# Patient Record
Sex: Female | Born: 1945
Health system: Southern US, Community
[De-identification: ages and names within clinical notes are randomized; demographics above are authoritative.]

## PROBLEM LIST (undated history)

## (undated) DIAGNOSIS — E079 Disorder of thyroid, unspecified: Secondary | ICD-10-CM

## (undated) DIAGNOSIS — I10 Essential (primary) hypertension: Secondary | ICD-10-CM

## (undated) DIAGNOSIS — M7120 Synovial cyst of popliteal space [Baker], unspecified knee: Principal | ICD-10-CM

## (undated) DIAGNOSIS — C9110 Chronic lymphocytic leukemia of B-cell type not having achieved remission: Secondary | ICD-10-CM

## (undated) DIAGNOSIS — M1990 Unspecified osteoarthritis, unspecified site: Secondary | ICD-10-CM

## (undated) HISTORY — DX: Essential (primary) hypertension: I10

## (undated) HISTORY — PX: TUBAL LIGATION: SHX77

## (undated) HISTORY — PX: VAGINAL HYSTERECTOMY: SUR661

## (undated) HISTORY — PX: THYROID SURGERY: SHX805

## (undated) HISTORY — DX: Disorder of thyroid, unspecified: E07.9

## (undated) HISTORY — DX: Synovial cyst of popliteal space (Baker), unspecified knee: M71.20

## (undated) HISTORY — DX: Unspecified osteoarthritis, unspecified site: M19.90

## (undated) HISTORY — DX: Chronic lymphocytic leukemia of B-cell type not having achieved remission: C91.10

---

## 2005-02-07 ENCOUNTER — Ambulatory Visit (HOSPITAL_COMMUNITY): Admission: RE | Admit: 2005-02-07 | Discharge: 2005-02-07 | Payer: Self-pay | Admitting: Pulmonary Disease

## 2011-08-31 DIAGNOSIS — H43399 Other vitreous opacities, unspecified eye: Secondary | ICD-10-CM | POA: Diagnosis not present

## 2011-08-31 DIAGNOSIS — H2510 Age-related nuclear cataract, unspecified eye: Secondary | ICD-10-CM | POA: Diagnosis not present

## 2011-09-25 ENCOUNTER — Other Ambulatory Visit (HOSPITAL_COMMUNITY): Payer: Self-pay | Admitting: Family Medicine

## 2011-09-25 ENCOUNTER — Ambulatory Visit (HOSPITAL_COMMUNITY)
Admission: RE | Admit: 2011-09-25 | Discharge: 2011-09-25 | Disposition: A | Discharge status: Home / Self Care | Payer: Medicare Other | Source: Ambulatory Visit | Attending: Family Medicine | Admitting: Family Medicine

## 2011-09-25 DIAGNOSIS — R5381 Other malaise: Secondary | ICD-10-CM | POA: Diagnosis not present

## 2011-09-25 DIAGNOSIS — E039 Hypothyroidism, unspecified: Secondary | ICD-10-CM | POA: Diagnosis not present

## 2011-09-25 DIAGNOSIS — R937 Abnormal findings on diagnostic imaging of other parts of musculoskeletal system: Secondary | ICD-10-CM | POA: Diagnosis not present

## 2011-09-25 DIAGNOSIS — M25569 Pain in unspecified knee: Secondary | ICD-10-CM | POA: Diagnosis not present

## 2011-09-25 DIAGNOSIS — M1710 Unilateral primary osteoarthritis, unspecified knee: Secondary | ICD-10-CM | POA: Diagnosis not present

## 2011-09-25 DIAGNOSIS — M7120 Synovial cyst of popliteal space [Baker], unspecified knee: Secondary | ICD-10-CM

## 2011-09-25 DIAGNOSIS — E669 Obesity, unspecified: Secondary | ICD-10-CM | POA: Diagnosis not present

## 2011-09-25 DIAGNOSIS — N390 Urinary tract infection, site not specified: Secondary | ICD-10-CM | POA: Diagnosis not present

## 2011-09-27 DIAGNOSIS — E039 Hypothyroidism, unspecified: Secondary | ICD-10-CM | POA: Diagnosis not present

## 2011-09-27 DIAGNOSIS — M7120 Synovial cyst of popliteal space [Baker], unspecified knee: Secondary | ICD-10-CM | POA: Diagnosis not present

## 2011-09-28 ENCOUNTER — Encounter: Payer: Self-pay | Admitting: Orthopedic Surgery

## 2011-09-28 ENCOUNTER — Ambulatory Visit (INDEPENDENT_AMBULATORY_CARE_PROVIDER_SITE_OTHER): Payer: Medicare Other | Admitting: Orthopedic Surgery

## 2011-09-28 VITALS — BP 150/80 | Ht 69.0 in | Wt 234.0 lb

## 2011-09-28 DIAGNOSIS — R2240 Localized swelling, mass and lump, unspecified lower limb: Secondary | ICD-10-CM

## 2011-09-28 DIAGNOSIS — R29898 Other symptoms and signs involving the musculoskeletal system: Secondary | ICD-10-CM

## 2011-09-28 MED ORDER — GABAPENTIN 100 MG PO CAPS: 100.0000 mg | ORAL_CAPSULE | Freq: Three times a day (TID) | ORAL | Status: DC

## 2011-09-28 NOTE — Patient Instructions (Signed)
   You  will be scheduled for MRI of the RIGHT knee to evaluate the mass. A recertification will be done with her insurance and then he will come back for reevaluation.

## 2011-10-02 ENCOUNTER — Other Ambulatory Visit: Payer: Self-pay | Admitting: *Deleted

## 2011-10-02 ENCOUNTER — Telehealth: Payer: Self-pay | Admitting: Radiology

## 2011-10-02 DIAGNOSIS — R2240 Localized swelling, mass and lump, unspecified lower limb: Secondary | ICD-10-CM

## 2011-10-02 NOTE — Telephone Encounter (Signed)
I called to give the patient her MRI appointment at [facility] on 10-06-11 at 8:15 pm. Patient has Medicare, no precert is needed. She will follow bach in our office for results.

## 2011-10-04 ENCOUNTER — Encounter: Payer: Self-pay | Admitting: Orthopedic Surgery

## 2011-10-04 DIAGNOSIS — R2240 Localized swelling, mass and lump, unspecified lower limb: Secondary | ICD-10-CM | POA: Insufficient documentation

## 2011-10-04 NOTE — Progress Notes (Signed)
  Subjective:    Jacqueline Casey is a 66 y.o. female who presents history of a mass behind her RIGHT leg for about one month.  The mass came on gradually is associated with sharp burning 7/10 intermittently relieved by heat worsened with cold weather associated with any swelling.  She had an ultrasound which was normal except for the Baker's cyst.  We will do a knee x-ray today. The following portions of the patient's history were reviewed and updated as appropriate: allergies, current medications, past family history, past medical history, past social history, past surgical history and problem list.   Review of Systems A comprehensive review of systems was negative except for: Constitutional: positive for weight gain Allergic/Immunologic: positive for various food adverse reactions   Objective:    BP 150/80  Ht 5\' 9"  (1.753 m)  Wt 234 lb (106.142 kg)  BMI 34.56 kg/m2  Physical Exam(12) GENERAL: normal development   CDV: pulses are normal   Skin: normal  Lymph: nodes were not palpable/normal  Psychiatric: awake, alert and oriented  Neuro: normal sensation   Right knee: RIGHT knee has full range of motion.  There is swelling and tenderness behind the knee in the popliteal fossa with associated mass consistent with possible Baker cyst.  Muscle strength and muscle tone normal in the knee is stable with no meniscal signs.  Left knee:  normal and no effusion, full active range of motion, no joint line tenderness, ligamentous structures intact.   X-ray right knee: shows DJD changes, likely chronic    Assessment:    Right baker cyst    Plan:    attempt aspiration.  Aspiration attempt unsuccessful  Recommended MRI to further evaluate location assistant cause of the cyst which is usually an intra-articularreason.  Attempted aspiration RIGHT knee  Verbal consent  Time out  In the prone position the knee was prepped with alcohol and ethyl chloride.  18-gauge needle was  placed into the area in question and no fluid was expressed.  No complications were noted.

## 2011-10-06 ENCOUNTER — Other Ambulatory Visit: Payer: Medicare Other

## 2011-10-06 ENCOUNTER — Ambulatory Visit
Admission: RE | Admit: 2011-10-06 | Discharge: 2011-10-06 | Disposition: A | Discharge status: Home / Self Care | Payer: Medicare Other | Source: Ambulatory Visit | Attending: Orthopedic Surgery | Admitting: Orthopedic Surgery

## 2011-10-06 DIAGNOSIS — S83289A Other tear of lateral meniscus, current injury, unspecified knee, initial encounter: Secondary | ICD-10-CM | POA: Diagnosis not present

## 2011-10-06 DIAGNOSIS — M8569 Other cyst of bone, multiple sites: Secondary | ICD-10-CM | POA: Diagnosis not present

## 2011-10-06 DIAGNOSIS — M799 Soft tissue disorder, unspecified: Secondary | ICD-10-CM | POA: Diagnosis not present

## 2011-10-06 DIAGNOSIS — R2240 Localized swelling, mass and lump, unspecified lower limb: Secondary | ICD-10-CM

## 2011-10-06 MED ORDER — GADOBENATE DIMEGLUMINE 529 MG/ML IV SOLN
20.0000 mL | Freq: Once | INTRAVENOUS | Status: AC | PRN
  Administered 2011-10-06: 20 mL via INTRAVENOUS

## 2011-10-12 ENCOUNTER — Ambulatory Visit: Payer: Medicare Other | Admitting: Orthopedic Surgery

## 2011-10-18 ENCOUNTER — Ambulatory Visit (INDEPENDENT_AMBULATORY_CARE_PROVIDER_SITE_OTHER): Payer: Medicare Other | Admitting: Orthopedic Surgery

## 2011-10-18 ENCOUNTER — Encounter: Payer: Self-pay | Admitting: Orthopedic Surgery

## 2011-10-18 VITALS — BP 142/70 | Ht 69.0 in | Wt 234.0 lb

## 2011-10-18 DIAGNOSIS — M7120 Synovial cyst of popliteal space [Baker], unspecified knee: Secondary | ICD-10-CM

## 2011-10-18 HISTORY — DX: Synovial cyst of popliteal space (Baker), unspecified knee: M71.20

## 2011-10-18 NOTE — Progress Notes (Signed)
Patient ID: Jacqueline Casey, female   DOB: 15-Mar-1946, 66 y.o.   MRN: [id number] Chief Complaint  Patient presents with  . Results    mri result review   Previous notes indicate patient had any Baker cyst with attempted aspiration unsuccessful sent for an MRI to rule out mass MRI came back Baker cyst with severe arthritis of the knee and a lateral meniscal tear.  The patient is having pain in her lateral calf and the medial hamstring  I suspect that she may have some mononeuritis as Neurontin has seemed to help her.  Her knee seems fine normal range of motion stability and strength but no meniscal signs and no specific tenderness at the joint line.  Distal pulses intact.  Normal light touch and pressure sensation noted.  Ambulation normal.  Recommend ultrasound for calf pain with iontophoresis continue Neurontin recheck in 4 weeks.

## 2011-10-18 NOTE — Patient Instructions (Signed)
Therapy will be ordered @ [facility]

## 2011-10-23 ENCOUNTER — Ambulatory Visit (HOSPITAL_COMMUNITY)
Admission: RE | Admit: 2011-10-23 | Discharge: 2011-10-23 | Disposition: A | Discharge status: Home / Self Care | Payer: Medicare Other | Source: Ambulatory Visit | Attending: Orthopedic Surgery | Admitting: Orthopedic Surgery

## 2011-10-23 DIAGNOSIS — IMO0001 Reserved for inherently not codable concepts without codable children: Secondary | ICD-10-CM | POA: Insufficient documentation

## 2011-10-23 DIAGNOSIS — M25469 Effusion, unspecified knee: Secondary | ICD-10-CM | POA: Insufficient documentation

## 2011-10-23 DIAGNOSIS — M7989 Other specified soft tissue disorders: Secondary | ICD-10-CM | POA: Insufficient documentation

## 2011-10-23 DIAGNOSIS — M25569 Pain in unspecified knee: Secondary | ICD-10-CM | POA: Insufficient documentation

## 2011-10-23 DIAGNOSIS — M79609 Pain in unspecified limb: Secondary | ICD-10-CM | POA: Insufficient documentation

## 2011-10-23 NOTE — Evaluation (Signed)
Physical Therapy Evaluation  Patient Details  Name: Jacqueline Casey MRN: [id number] Date of Birth: 10/26/1945  Today's Date: 10/23/2011 Time: 1478-2956 Time Calculation (min): 55 min  Visit#: 1  of 6   Re-eval: 11/06/11 Assessment Diagnosis: R knee pain Next MD Visit: 3/27 Prior Therapy: none  Past Medical History:  Past Medical History  Diagnosis Date  . Thyroid disease    Past Surgical History:  Past Surgical History  Procedure Date  . Vaginal hysterectomy   . Tubal ligation   . Thyroid surgery     Subjective Symptoms/Limitations Symptoms: Jacqueline Casey states that approximately three months ago it felt like her hip went out. She states that it was very difficult just to turn over.  A few days later her pain went into her knee and by the end of the week she was significantly better but still had discomfort.  She was scheduled to see her primary MD for a physical and reported this to her MD who referred her to Dr. Romeo Apple.  An MRI was taken which showed a Baker's cyst and some effusion on the medial  posterior aspect of her knee.  She was placed on some medication and states at this time she feels 50% better but has not returned to work do to the fact tha she needs to be standing at work and standing increases her discomfort.   How long can you sit comfortably?: sitting is no problem. How long can you stand comfortably?: She is only able to stand for no more than ten minutes before her knee starts bothering her. How long can you walk comfortably?: She states that she is able to walk less than three minutes before the pain starts; She is able to continue to walk for fifteen minutes before she needs to sit down. Special Tests: If she turns onto her right side it will wake her up.  She wakes up a couple times a night. 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: Hobbies-yes (Comment) Comments: working with elderly (gardening)  Functional Tests Functional Tests: lg calf R 46 cm L 45  Assessment RLE AROM (degrees) Right Knee Extension 0-130: 113  Right Knee Flexion 0-140: 0  RLE Strength Right Hip Flexion: 5/5 Right Hip Extension: 5/5 Right Hip ABduction: 5/5 Right Hip ADduction: 5/5 Right Knee Flexion: 5/5 Right Knee Extension: 5/5 Right Ankle Dorsiflexion: 5/5 Palpation Palpation:  (baker's cyst noted posterior lateral aspect of knee.)  Exercise/Treatments  Modalities Modalities: Iontophoresis Manual Therapy Manual Therapy: Massage Massage: soft tissue massage from ankle to knee Iontophoresis Type of Iontophoresis: Dexamethasone Location: posterior aspect of knee Dose: 1.5 cc dex @ 60 MA 3.5  Time: 16 min  Physical Therapy Assessment and Plan PT Assessment and Plan Clinical Impression Statement: Pt with increase swelling and pain of LE affecting her ability to work who will benefit from skilled PT to reduce the patient pain and return pt to previous functional level Rehab Potential: Good PT Frequency: Min 3X/week PT Duration:  (2 weeks) PT Treatment/Interventions:  (modalities for pain control and decrease swellng) PT Plan: see three times a week for soft tissue work as well as iontophoresis; begin Theme park manager    Goals PT  Short Term Goals Time to Complete Short Term Goals: 2 weeks PT Short Term Goal 1: Pain to be no greater than a 2 PT Short Term Goal 2: Pt to be able to stand for two hours at a time to allow 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 PT Short Term Goal 3: Pt to be able to walk for 30 minutes at a time to allow pt to complete shopping  duties.  Problem List Patient Active Problem List  Diagnoses  . Knee mass  . Baker's cyst of knee    PT - End of Session Activity Tolerance: Patient tolerated treatment well General Behavior During Session: [facility] for tasks performed Cognition: [facility] for tasks performed PT Plan of Care PT Home Exercise Plan: none given at this time Consulted and Agree with Plan of Care: Patient  GP  Functional Reporting Modifier  Current Status  [phone] - Mobility: Walking & Moving Around CK - At least 40% but less than 60% impaired, limited or restricted  Goal Status  [id number] - Mobility: [place] & Moving Around CI - At least 1% but less than 20% impaired, limited or restricted   I chose CK due to strength/ROM being wnl but the patient's pain is prohibiting her from working. She has increased pain within three minutes of walking. Zymir Napoli,CINDY 10/23/2011, 5:22 PM  Physician Documentation Your signature is required to indicate approval of the treatment plan as stated above.  Please sign and either send electronically or make a copy of this report for your files and return this physician signed original.   Please mark one 1.__approve of plan  2. ___approve of plan with the following conditions.   ______________________________                                                          _____________________ Physician Signature                                                                                                             Date

## 2011-10-25 ENCOUNTER — Ambulatory Visit (HOSPITAL_COMMUNITY)
Admission: RE | Admit: 2011-10-25 | Discharge: 2011-10-25 | Disposition: A | Discharge status: Home / Self Care | Payer: Medicare Other | Source: Ambulatory Visit | Attending: Family Medicine | Admitting: Family Medicine

## 2011-10-25 DIAGNOSIS — IMO0001 Reserved for inherently not codable concepts without codable children: Secondary | ICD-10-CM | POA: Diagnosis not present

## 2011-10-25 DIAGNOSIS — M7989 Other specified soft tissue disorders: Secondary | ICD-10-CM | POA: Diagnosis not present

## 2011-10-25 DIAGNOSIS — M79609 Pain in unspecified limb: Secondary | ICD-10-CM | POA: Diagnosis not present

## 2011-10-25 DIAGNOSIS — M25469 Effusion, unspecified knee: Secondary | ICD-10-CM | POA: Diagnosis not present

## 2011-10-25 DIAGNOSIS — M25569 Pain in unspecified knee: Secondary | ICD-10-CM | POA: Diagnosis not present

## 2011-10-25 NOTE — Progress Notes (Signed)
Physical Therapy Treatment Patient Details  Name: CONSUELO SUTHERS MRN: [id number] Date of Birth: Feb 17, 1946  Today's Date: 10/25/2011 Time: 1020-1100 Time Calculation (min): 40 min Visit#: 2  of 6   Re-eval: 11/06/11    Subjective: Symptoms/Limitations Symptoms: Ms. Jablonski states she feels better.  Pt is having some discomfort on the inside of her leg ,(palpation shows tight mm).   Pain Assessment Currently in Pain?: Yes Pain Score:   3 Pain Location: Leg Pain Orientation: Posterior   Exercise/Treatments   Ankle Stretches Gastroc Stretch: 3 reps;30 seconds     Manual Therapy Manual Therapy: Massage Massage: soft tissue ankle to mid thigh. Iontophoresis Type of Iontophoresis: Dexamethasone Location: posterior aspect of right leg. Dose: 1.5 cc dex @60  ma  Physical Therapy Assessment and Plan PT Assessment and Plan Clinical Impression Statement: decreased swelling noted in popliteal fossa.    Goals    Problem List Patient Active Problem List  Diagnoses  . Knee mass  . Baker's cyst of knee    PT - End of Session Activity Tolerance: Patient tolerated treatment well General Behavior During Session: [facility] for tasks performed Cognition: [facility] for tasks performed  GP No functional reporting required required at D/C or visit 10 whichever comes first.  RUSSELL,CINDY 10/25/2011, 10:48 AM

## 2011-10-26 ENCOUNTER — Ambulatory Visit (HOSPITAL_COMMUNITY)
Admission: RE | Admit: 2011-10-26 | Discharge: 2011-10-26 | Disposition: A | Discharge status: Home / Self Care | Payer: Medicare Other | Source: Ambulatory Visit | Attending: Family Medicine | Admitting: Family Medicine

## 2011-10-26 DIAGNOSIS — M79609 Pain in unspecified limb: Secondary | ICD-10-CM | POA: Diagnosis not present

## 2011-10-26 DIAGNOSIS — IMO0001 Reserved for inherently not codable concepts without codable children: Secondary | ICD-10-CM | POA: Diagnosis not present

## 2011-10-26 DIAGNOSIS — M7989 Other specified soft tissue disorders: Secondary | ICD-10-CM | POA: Diagnosis not present

## 2011-10-26 DIAGNOSIS — M25469 Effusion, unspecified knee: Secondary | ICD-10-CM | POA: Diagnosis not present

## 2011-10-26 DIAGNOSIS — M25569 Pain in unspecified knee: Secondary | ICD-10-CM | POA: Diagnosis not present

## 2011-10-26 NOTE — Progress Notes (Signed)
Physical Therapy Treatment Patient Details  Name: Jacqueline Casey MRN: [id number] Date of Birth: [birth year]  Today's Date: 10/26/2011 Time: 4098-1191 Time Calculation (min): 59 min Visit#: 3  of 6   Re-eval: 11/06/11 Charges: Manual x 20' Ionto x 20'  Subjective: Symptoms/Limitations Symptoms: It's really feeling better. Pain Assessment Currently in Pain?: Yes Pain Score:   3 Pain Location: Knee Pain Orientation: Posterior;Right   Exercise/Treatments Stretches Gastroc Stretch: 3 reps;30 seconds   Modalities Modalities: Iontophoresis Manual Therapy Manual Therapy: Massage Massage: soft tissue ankle to mid thigh x 20' Iontophoresis Type of Iontophoresis: Dexamethasone Location: posterior aspect of right knee Dose: 2 cc dex @ 40 ma Time: 20'  Physical Therapy Assessment and Plan PT Assessment and Plan Clinical Impression Statement: Pt has sensitivity to manual techniques. Tenderness decreased after manual, per pt. Ultrasound gel applied to white pad during iontophoresis to reduce skin irritation. Pt reports pain decrease to 2/10 at end of session. PT Plan: Continue to progress per PT POC.     Problem List Patient Active Problem List  Diagnoses  . Knee mass  . Baker's cyst of knee    PT - End of Session Activity Tolerance: Patient tolerated treatment well General Behavior During Session: [facility] for tasks performed Cognition: [facility] for tasks performed    Seth Bake, PTA 10/26/2011, 10:48 AM

## 2011-10-30 ENCOUNTER — Ambulatory Visit (HOSPITAL_COMMUNITY)
Admission: RE | Admit: 2011-10-30 | Discharge: 2011-10-30 | Disposition: A | Discharge status: Home / Self Care | Payer: Medicare Other | Source: Ambulatory Visit | Attending: Family Medicine | Admitting: Family Medicine

## 2011-10-30 DIAGNOSIS — M7989 Other specified soft tissue disorders: Secondary | ICD-10-CM | POA: Diagnosis not present

## 2011-10-30 DIAGNOSIS — M25469 Effusion, unspecified knee: Secondary | ICD-10-CM | POA: Diagnosis not present

## 2011-10-30 DIAGNOSIS — M25569 Pain in unspecified knee: Secondary | ICD-10-CM | POA: Diagnosis not present

## 2011-10-30 DIAGNOSIS — M79609 Pain in unspecified limb: Secondary | ICD-10-CM | POA: Diagnosis not present

## 2011-10-30 DIAGNOSIS — IMO0001 Reserved for inherently not codable concepts without codable children: Secondary | ICD-10-CM | POA: Diagnosis not present

## 2011-10-30 NOTE — Progress Notes (Signed)
Physical Therapy Treatment Patient Details  Name: JOHNANNA BAKKE MRN: [id number] Date of Birth: March 25, 1946  Today's Date: 10/30/2011 Time: 2956-2130 Time Calculation (min): 47 min Visit#: 4  of 6   Re-eval: 11/06/11 Charges: Manual x 15' Ionto x 20'   Subjective: Symptoms/Limitations Symptoms: My pain is about a 3/10. It's always in the same spot right behind my knee. Pain Assessment Currently in Pain?: Yes Pain Score:   3 Pain Location: Knee Pain Orientation: Posterior;Right   Exercise/Treatments Stretches Gastroc Stretch: 3 reps;30 seconds   Modalities Modalities: Iontophoresis Manual Therapy Manual Therapy: Massage Massage: Retro massage to R LE ankle to mid thigh to decrease swelling/pain x 15' Iontophoresis Type of Iontophoresis: Dexamethasone Location: posterior aspect of right knee  Dose: 2cc dex @ 60 ma Time: 16'  Physical Therapy Assessment and Plan PT Assessment and Plan Clinical Impression Statement: Decrease swelling and heat in R LE after manual techniques. Pt states that her knee feels better at end of session but her pain remains at 3/10. PT Plan: Continue to progress per PT POC.     Problem List Patient Active Problem List  Diagnoses  . Knee mass  . Baker's cyst of knee    PT - End of Session Activity Tolerance: Patient tolerated treatment well General Behavior During Session: [facility] for tasks performed Cognition: [facility] for tasks performed   Seth Bake, PTA 10/30/2011, 10:30 AM

## 2011-11-01 ENCOUNTER — Ambulatory Visit (HOSPITAL_COMMUNITY)
Admission: RE | Admit: 2011-11-01 | Discharge: 2011-11-01 | Disposition: A | Discharge status: Home / Self Care | Payer: Medicare Other | Source: Ambulatory Visit | Attending: Family Medicine | Admitting: Family Medicine

## 2011-11-01 DIAGNOSIS — IMO0001 Reserved for inherently not codable concepts without codable children: Secondary | ICD-10-CM | POA: Diagnosis not present

## 2011-11-01 DIAGNOSIS — M79609 Pain in unspecified limb: Secondary | ICD-10-CM | POA: Diagnosis not present

## 2011-11-01 DIAGNOSIS — M25469 Effusion, unspecified knee: Secondary | ICD-10-CM | POA: Diagnosis not present

## 2011-11-01 DIAGNOSIS — M7989 Other specified soft tissue disorders: Secondary | ICD-10-CM | POA: Diagnosis not present

## 2011-11-01 DIAGNOSIS — M25569 Pain in unspecified knee: Secondary | ICD-10-CM | POA: Diagnosis not present

## 2011-11-01 NOTE — Progress Notes (Signed)
Physical Therapy Treatment Patient Details  Name: Jacqueline Casey MRN: [id number] Date of Birth: Jan 10, 1946  Today's Date: 11/01/2011 Time: 9833-8250 Time Calculation (min): 34 min Visit#: 5  of 6   Re-eval: 11/06/11    Subjective: Symptoms/Limitations Symptoms: When I left here last treatment I was hurting worse and I haven't quite recovered. Pain Assessment Currently in Pain?: Yes Pain Score:   4 Pain Location: Calf Pain Orientation: Right;Posterior Pain Type: Chronic pain Pain Onset: More than a month ago Pain Frequency: Constant  Exercise/Treatments  Ankle Stretches Slant Board Stretch: 3 reps;30 seconds    Modalities Modalities: Iontophoresis Manual Therapy Manual Therapy: Massage Massage: retro massage to decrease swelling  Iontophoresis Type of Iontophoresis: Dexamethasone Location: medial aspect Dose: 2 cc @ 60ma  Physical Therapy Assessment and Plan PT Assessment and Plan Clinical Impression Statement: Continues to have swelling and pain. Pain increased after last session.   PT Plan: reassess next visit.    Goals    Problem List Patient Active Problem List  Diagnoses  . Knee mass  . Baker's cyst of knee    PT - End of Session Activity Tolerance: Patient tolerated treatment well General Behavior During Session: [facility] for tasks performed Cognition: [facility] for tasks performed  GP No functional reporting required  Arvie Bartholomew,CINDY 11/01/2011, 10:05 AM

## 2011-11-02 ENCOUNTER — Ambulatory Visit (HOSPITAL_COMMUNITY)
Admission: RE | Admit: 2011-11-02 | Discharge: 2011-11-02 | Disposition: A | Discharge status: Home / Self Care | Payer: Medicare Other | Source: Ambulatory Visit | Attending: Family Medicine | Admitting: Family Medicine

## 2011-11-02 DIAGNOSIS — M25469 Effusion, unspecified knee: Secondary | ICD-10-CM | POA: Diagnosis not present

## 2011-11-02 DIAGNOSIS — M25569 Pain in unspecified knee: Secondary | ICD-10-CM | POA: Diagnosis not present

## 2011-11-02 DIAGNOSIS — M7989 Other specified soft tissue disorders: Secondary | ICD-10-CM | POA: Diagnosis not present

## 2011-11-02 DIAGNOSIS — M79609 Pain in unspecified limb: Secondary | ICD-10-CM | POA: Diagnosis not present

## 2011-11-02 DIAGNOSIS — IMO0001 Reserved for inherently not codable concepts without codable children: Secondary | ICD-10-CM | POA: Diagnosis not present

## 2011-11-02 NOTE — Evaluation (Signed)
Physical Therapy Reassessment  Patient Details  Name: Jacqueline Casey MRN: [id number] Date of Birth: 05/07/1946  Today's Date: 11/02/2011 Time: 1020-1110 Time Calculation (min): 50 min  Visit#: 6  of 6   Re-eval: 11/02/11 Assessment Diagnosis: R knee pain Next MD Visit: 3/27 Prior Therapy: none  Past Medical History:  Past Medical History  Diagnosis Date  . Thyroid disease    Past Surgical History:  Past Surgical History  Procedure Date  . Vaginal hysterectomy   . Tubal ligation   . Thyroid surgery     Subjective Symptoms/Limitations Symptoms: Pt states that there seems to be a little less pain when myu leg was wrapped.  How long can you sit comfortably?: sitting is no problem and wasn't at the inital evaluation. How long can you stand comfortably?: The patient is only able to stand for ten minutes before her knee starts sto bother her.  Was ten minutes. How long can you walk comfortably?: The patient states that she is able to walk for fifteen minutes when she needs to rest.  She states the pain is now going down into her ankle. Pain Assessment Currently in Pain?: Yes Pain Score:   3 Pain Location: Leg Pain Orientation: Right;Medial (Pain has moved it was posteriorlateral aspect.) Pain Type: Chronic pain Pain Onset: More than a month ago Pain Frequency: Constant Pain Relieving Factors: sitting/medication.   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: Hobbies-yes (Comment)  Cognition/Observation    Sensation/Coordination/Flexibility/Functional Tests Functional Tests Functional Tests: lg calf R 45.5 cm L 45  Assessment RLE AROM (degrees) Right Knee Extension 0-130: 115  Right Knee Flexion 0-140: 0  RLE Strength Right Hip Flexion: 5/5 Right Hip Extension: 5/5 Right Hip ABduction: 5/5 Right Hip ADduction: 5/5 Right Knee Flexion: 5/5 Right Knee Extension:  5/5 Right Ankle Dorsiflexion: 5/5 Palpation Palpation:  (baker's cyst noted posterior lateral aspect of knee.)  Exercise/Treatments  Modalities Modalities: Iontophoresis Manual Therapy Manual Therapy: Massage Massage: retromassage to decrease swelling Iontophoresis Type of Iontophoresis: Dexamethasone Location: medial aspect. Dose: 2 cc @60ma  Time: 86'  Physical Therapy Assessment and Plan PT Assessment and Plan Clinical Impression Statement: Pt has made minimum improvement with therapy.  Pt will return to MD. Suggested that patient may want to try compression stockings to control pain. PT Plan: Discharge patient as therapy does not seem to help.     Goals PT Short Term Goals PT Short Term Goal 1 - Progress: Not met PT Short Term Goal 2 - Progress: Not met PT Short Term Goal 3 - Progress: Not met  Problem List Patient Active Problem List  Diagnoses  . Knee mass  . Baker's cyst of knee    PT - End of Session Activity Tolerance: Patient tolerated treatment well    Quincee Gittens,CINDY 11/02/2011, 11:00 AM  Physician Documentation Your signature is required to indicate approval of the treatment plan as stated above.  Please sign and either send electronically or make a copy of this report for your files and return this physician signed original.   Please mark one 1.__approve of plan  2. ___approve of plan with the following conditions.   ______________________________  _____________________ Physician Signature                                                                                                             Date

## 2011-11-15 ENCOUNTER — Ambulatory Visit (INDEPENDENT_AMBULATORY_CARE_PROVIDER_SITE_OTHER): Payer: Medicare Other | Admitting: Orthopedic Surgery

## 2011-11-15 ENCOUNTER — Encounter: Payer: Self-pay | Admitting: Orthopedic Surgery

## 2011-11-15 VITALS — BP 150/80 | Ht 69.0 in | Wt 234.0 lb

## 2011-11-15 DIAGNOSIS — M7120 Synovial cyst of popliteal space [Baker], unspecified knee: Secondary | ICD-10-CM

## 2011-11-15 NOTE — Progress Notes (Signed)
Patient ID: Jacqueline Casey, female   DOB: 01/31/1946, 66 y.o.   MRN: [id number] Chief Complaint  Patient presents with  . Follow-up    4 week recheck on right calf after therapy.    The pain the patient was having in her knee and Baker's cyst have resolved. However, now she is having pain in her ankle pain on the medial side of her upper thigh into the knee area and unilateral leg swelling.  She stopped taking Neurontin, even though it did help because she was afraid of liver toxicity,  She's had physical therapy, she's had a ultrasound.  She now has unilateral leg swelling. She did try a compression stocking, but had dictated oversized and was concerned that it may not help.  As we could not handle the pain, issues. We've referred her to vascular surgery for consultation for unilateral leg edema and leg swelling.  She does have some RIGHT ankle. Tenderness over the posterior tibial tendon with no history of injury. She may even have some bursitis on the medial side of the knee.  We'll follow up as needed

## 2011-11-15 NOTE — Patient Instructions (Addendum)
Stopped the neurontin   Wear oversize compression hose   Check with Vein specialist regarding swelling of the leg

## 2011-11-21 DIAGNOSIS — I10 Essential (primary) hypertension: Secondary | ICD-10-CM | POA: Diagnosis not present

## 2011-11-21 DIAGNOSIS — I2510 Atherosclerotic heart disease of native coronary artery without angina pectoris: Secondary | ICD-10-CM | POA: Diagnosis not present

## 2011-11-21 DIAGNOSIS — M1710 Unilateral primary osteoarthritis, unspecified knee: Secondary | ICD-10-CM | POA: Diagnosis not present

## 2011-12-26 DIAGNOSIS — I10 Essential (primary) hypertension: Secondary | ICD-10-CM | POA: Diagnosis not present

## 2011-12-26 DIAGNOSIS — E039 Hypothyroidism, unspecified: Secondary | ICD-10-CM | POA: Diagnosis not present

## 2012-02-05 DIAGNOSIS — I10 Essential (primary) hypertension: Secondary | ICD-10-CM | POA: Diagnosis not present

## 2012-02-05 DIAGNOSIS — E669 Obesity, unspecified: Secondary | ICD-10-CM | POA: Diagnosis not present

## 2012-02-05 DIAGNOSIS — M1710 Unilateral primary osteoarthritis, unspecified knee: Secondary | ICD-10-CM | POA: Diagnosis not present

## 2012-02-20 DIAGNOSIS — H2510 Age-related nuclear cataract, unspecified eye: Secondary | ICD-10-CM | POA: Diagnosis not present

## 2012-03-25 ENCOUNTER — Telehealth: Payer: Self-pay | Admitting: Orthopedic Surgery

## 2012-03-25 NOTE — Telephone Encounter (Signed)
Patient called to ask about the fax of her medical records to [facility], per initial request 02/26/12;   She states she called the [facility] office today, and was told they have not received the records.  Advised patient that we had faxed the records with her signed authorization for release, on 02/28/12, to this office.  Verified fax#, re-faxed all to [phone].  Patient aware.

## 2012-04-09 DIAGNOSIS — H2510 Age-related nuclear cataract, unspecified eye: Secondary | ICD-10-CM | POA: Diagnosis not present

## 2012-06-10 DIAGNOSIS — H2510 Age-related nuclear cataract, unspecified eye: Secondary | ICD-10-CM | POA: Diagnosis not present

## 2012-06-10 DIAGNOSIS — H269 Unspecified cataract: Secondary | ICD-10-CM | POA: Diagnosis not present

## 2012-06-20 DIAGNOSIS — M1710 Unilateral primary osteoarthritis, unspecified knee: Secondary | ICD-10-CM | POA: Diagnosis not present

## 2012-08-12 DIAGNOSIS — D235 Other benign neoplasm of skin of trunk: Secondary | ICD-10-CM | POA: Diagnosis not present

## 2012-08-12 DIAGNOSIS — L821 Other seborrheic keratosis: Secondary | ICD-10-CM | POA: Diagnosis not present

## 2012-08-21 HISTORY — PX: HAND SURGERY: SHX662

## 2012-09-03 DIAGNOSIS — I10 Essential (primary) hypertension: Secondary | ICD-10-CM | POA: Diagnosis not present

## 2012-09-03 DIAGNOSIS — E039 Hypothyroidism, unspecified: Secondary | ICD-10-CM | POA: Diagnosis not present

## 2012-12-04 DIAGNOSIS — E039 Hypothyroidism, unspecified: Secondary | ICD-10-CM | POA: Diagnosis not present

## 2012-12-04 DIAGNOSIS — M1710 Unilateral primary osteoarthritis, unspecified knee: Secondary | ICD-10-CM | POA: Diagnosis not present

## 2012-12-04 DIAGNOSIS — I10 Essential (primary) hypertension: Secondary | ICD-10-CM | POA: Diagnosis not present

## 2013-02-06 DIAGNOSIS — L821 Other seborrheic keratosis: Secondary | ICD-10-CM | POA: Diagnosis not present

## 2013-02-06 DIAGNOSIS — L259 Unspecified contact dermatitis, unspecified cause: Secondary | ICD-10-CM | POA: Diagnosis not present

## 2013-02-06 DIAGNOSIS — D235 Other benign neoplasm of skin of trunk: Secondary | ICD-10-CM | POA: Diagnosis not present

## 2013-04-09 DIAGNOSIS — Z0000 Encounter for general adult medical examination without abnormal findings: Secondary | ICD-10-CM | POA: Diagnosis not present

## 2013-04-09 DIAGNOSIS — I10 Essential (primary) hypertension: Secondary | ICD-10-CM | POA: Diagnosis not present

## 2013-04-09 DIAGNOSIS — E039 Hypothyroidism, unspecified: Secondary | ICD-10-CM | POA: Diagnosis not present

## 2013-04-09 DIAGNOSIS — M1710 Unilateral primary osteoarthritis, unspecified knee: Secondary | ICD-10-CM | POA: Diagnosis not present

## 2013-09-12 IMAGING — US US EXTREM LOW*R* LIMITED
1 series · 14 of 16 positions shown · non-contrast
Comparison: None.

CLINICAL DATA: Pain in the posterior aspect of the knee and in the
right calf.  Evaluate for Baker's cyst.

ULTRASOUND RIGHT LOWER EXTREMITY  LIMITED
TECHNIQUE: Ultrasound examination of the area posterior to the
right knee was performed.

[Series 1: us extrem low*right* limited · 0.10mm/px · 14 of 16 slices shown]
[im 1/16]
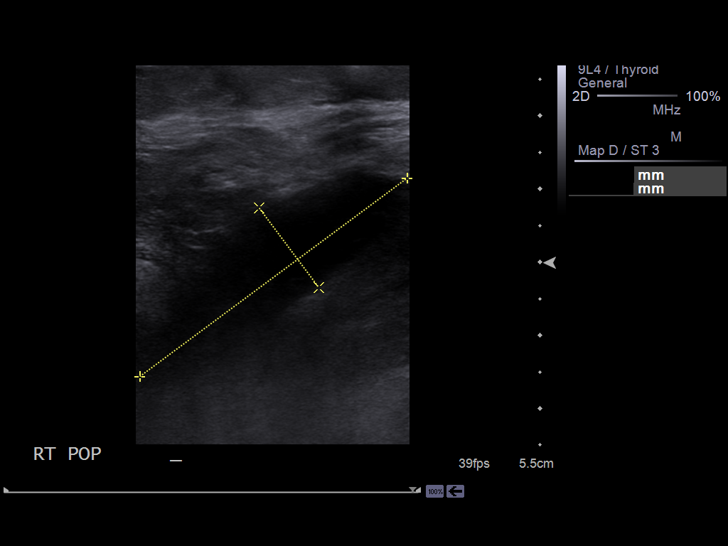
[im 2/16]
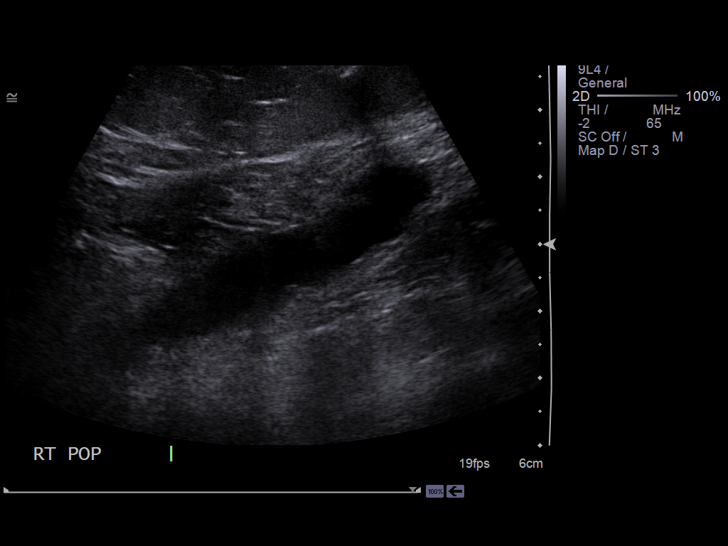
[im 3/16]
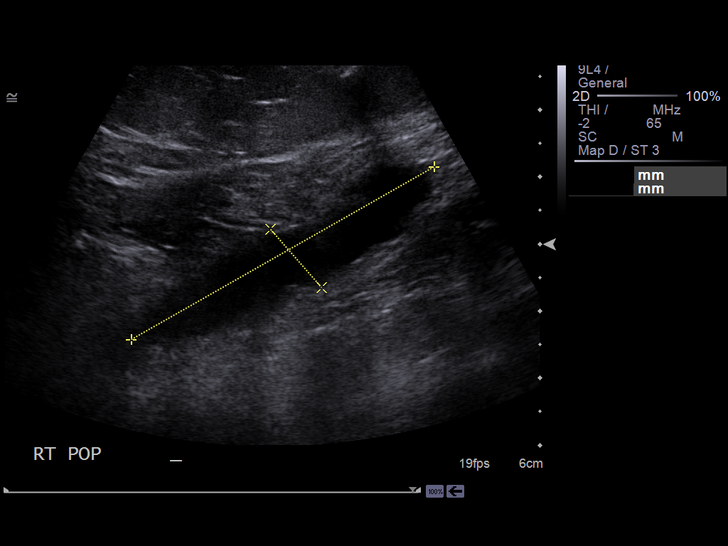
[im 5/16]
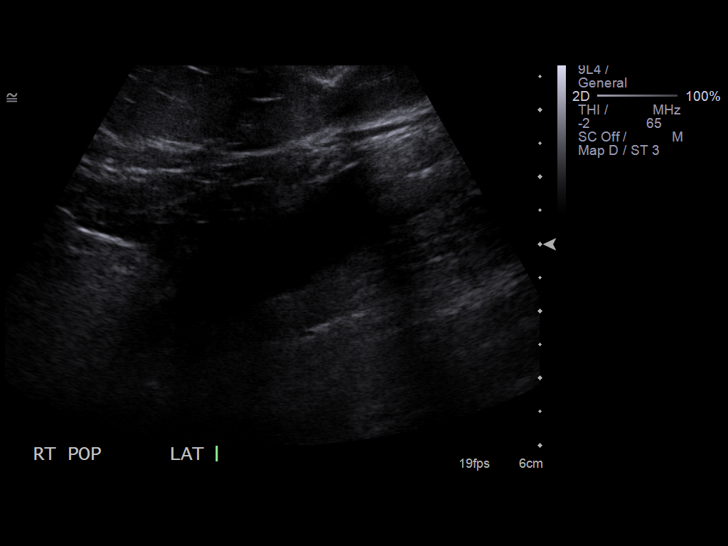
[im 6/16]
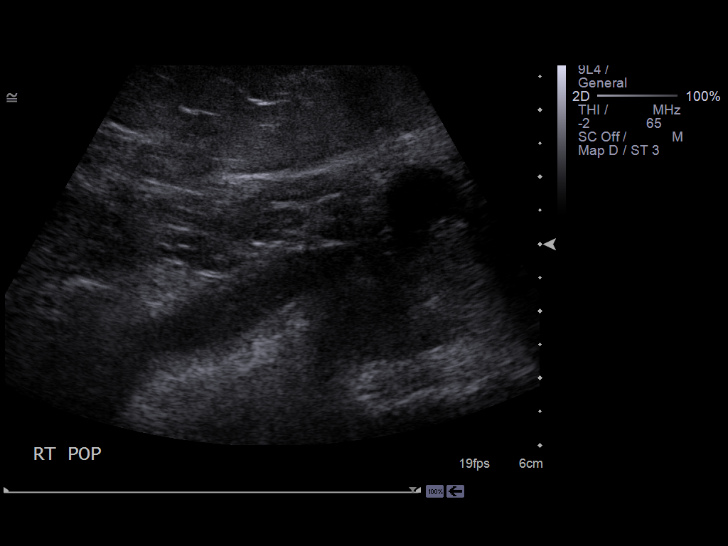
[im 7/16]
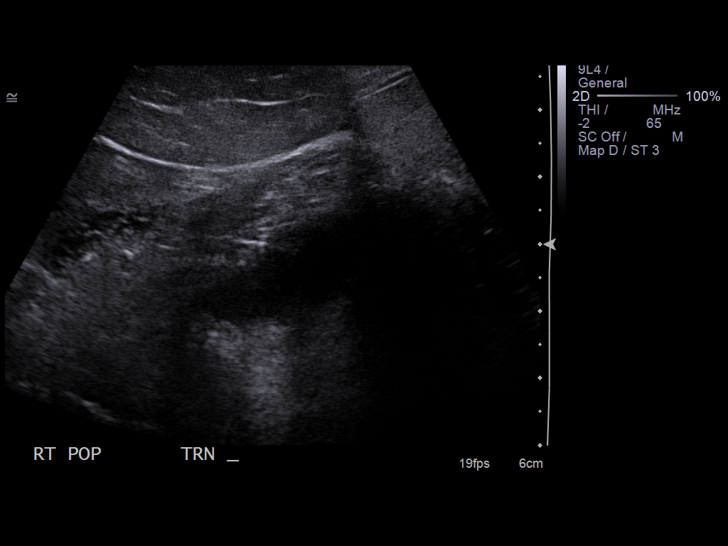
[im 8/16]
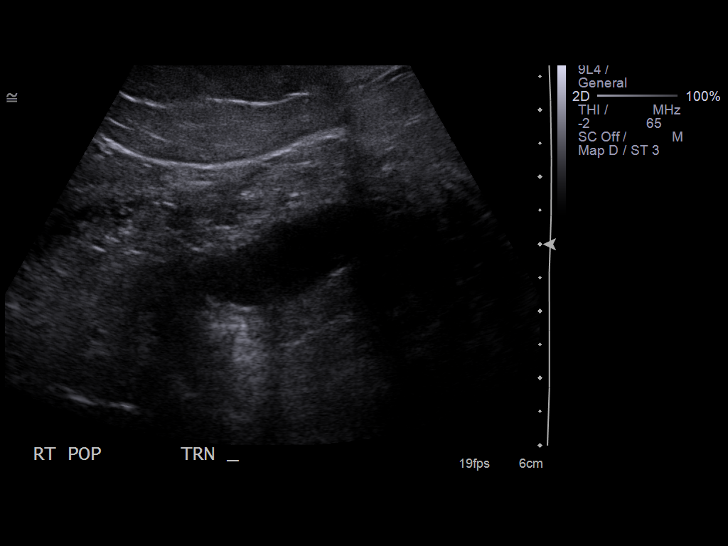
[im 9/16]
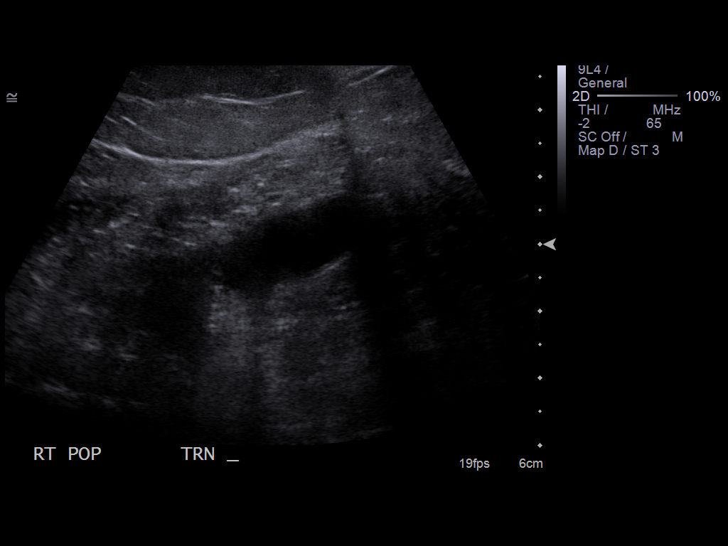
[im 10/16]
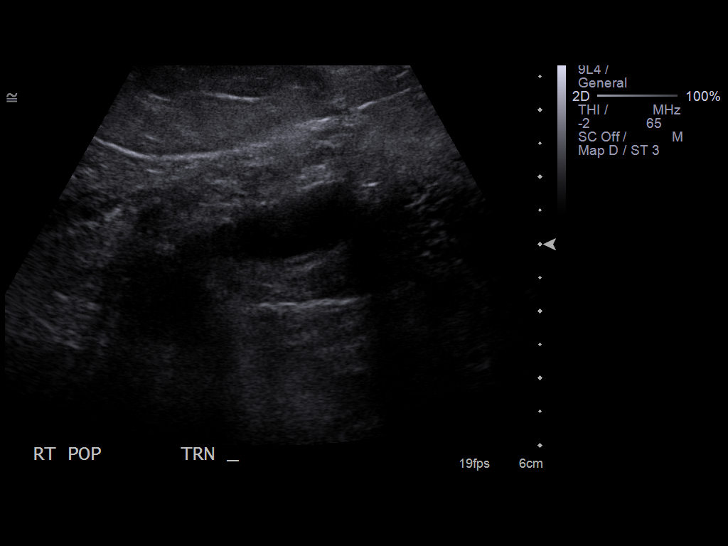
[im 11/16]
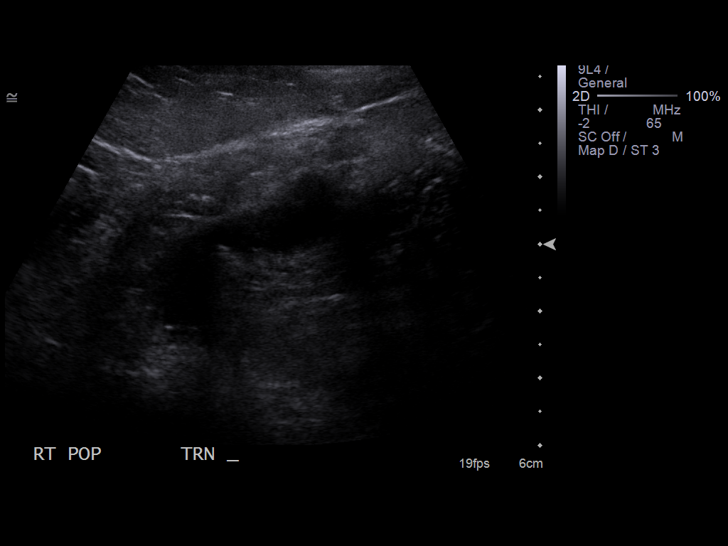
[im 13/16]
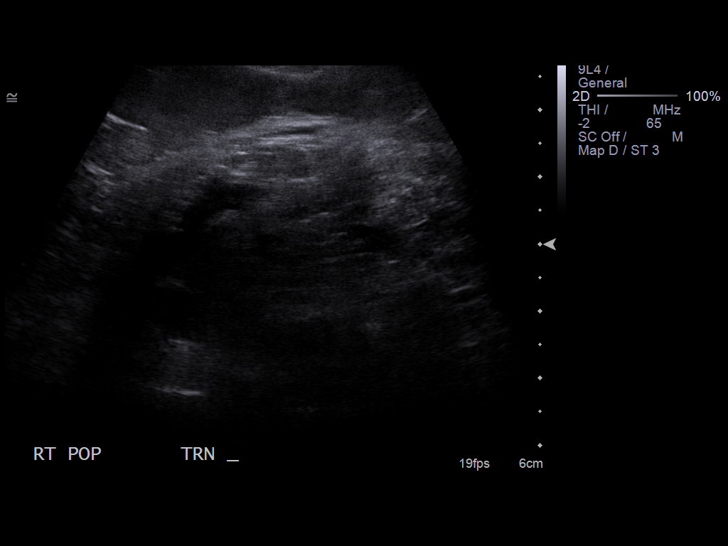
[im 14/16]
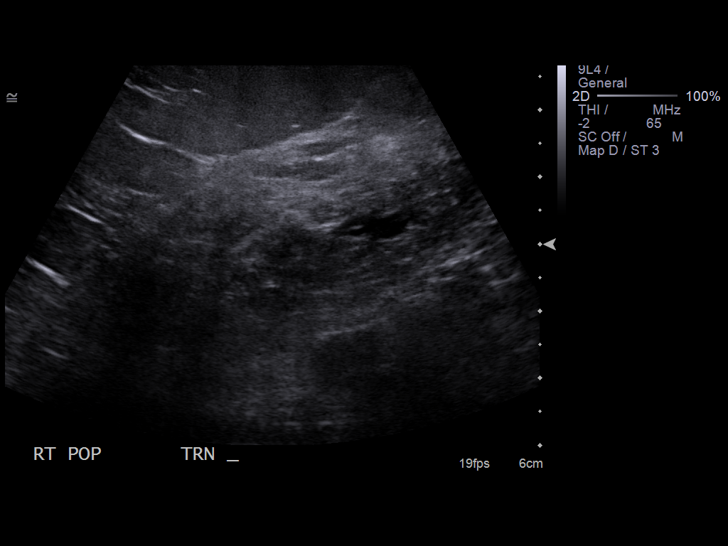
[im 15/16]
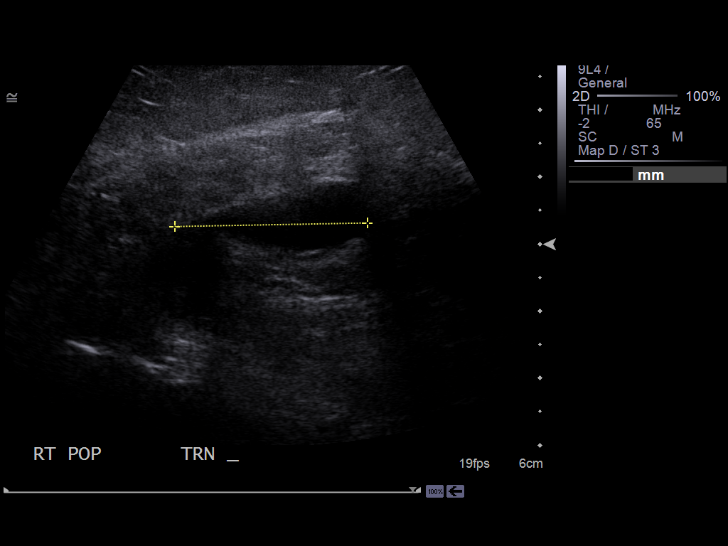
[im 16/16]
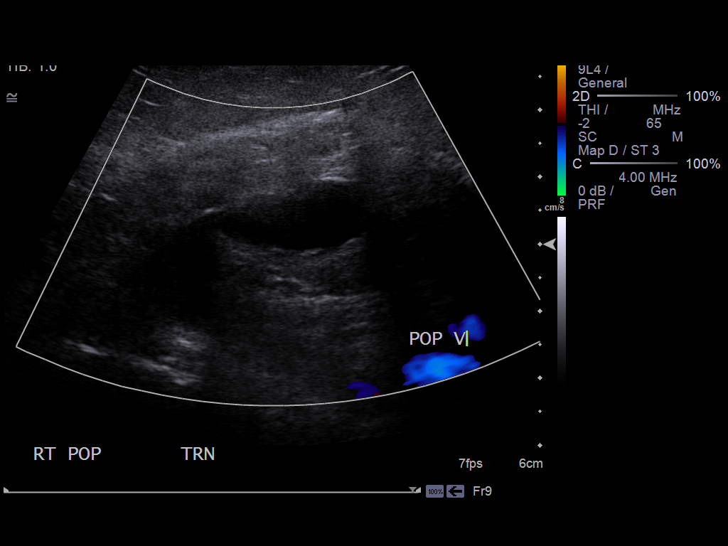

[14 of 16 positions shown; findings below may reference images not displayed]

FINDINGS: A fluid collection is seen in the popliteal having the
appearance of Juwaidi Teon popliteal synovial cyst.  It measured
cm in length with transverse diameters of 2.9 x 1.2 cm.  No solid
mass or other lesion was seen.  There is slight extension into the
upper calf.
IMPRESSION: Posterior fluid collection consistent with popliteal synovial cyst
is identified.

## 2013-10-28 ENCOUNTER — Other Ambulatory Visit (HOSPITAL_COMMUNITY): Payer: Self-pay | Admitting: Family Medicine

## 2013-10-28 ENCOUNTER — Ambulatory Visit (HOSPITAL_COMMUNITY)
Admission: RE | Admit: 2013-10-28 | Discharge: 2013-10-28 | Disposition: A | Discharge status: Home / Self Care | Payer: Medicare Other | Source: Ambulatory Visit | Attending: Family Medicine | Admitting: Family Medicine

## 2013-10-28 DIAGNOSIS — M25549 Pain in joints of unspecified hand: Secondary | ICD-10-CM

## 2013-10-28 DIAGNOSIS — Z6834 Body mass index (BMI) 34.0-34.9, adult: Secondary | ICD-10-CM | POA: Diagnosis not present

## 2013-10-28 DIAGNOSIS — M7989 Other specified soft tissue disorders: Secondary | ICD-10-CM | POA: Insufficient documentation

## 2013-10-28 DIAGNOSIS — S60229A Contusion of unspecified hand, initial encounter: Secondary | ICD-10-CM | POA: Diagnosis not present

## 2013-10-28 DIAGNOSIS — S62309A Unspecified fracture of unspecified metacarpal bone, initial encounter for closed fracture: Secondary | ICD-10-CM | POA: Diagnosis not present

## 2013-10-28 DIAGNOSIS — S62329A Displaced fracture of shaft of unspecified metacarpal bone, initial encounter for closed fracture: Secondary | ICD-10-CM | POA: Diagnosis not present

## 2013-10-28 DIAGNOSIS — W19XXXA Unspecified fall, initial encounter: Secondary | ICD-10-CM | POA: Insufficient documentation

## 2013-10-30 DIAGNOSIS — S62309A Unspecified fracture of unspecified metacarpal bone, initial encounter for closed fracture: Secondary | ICD-10-CM | POA: Diagnosis not present

## 2013-11-04 DIAGNOSIS — Y939 Activity, unspecified: Secondary | ICD-10-CM | POA: Diagnosis not present

## 2013-11-04 DIAGNOSIS — Y999 Unspecified external cause status: Secondary | ICD-10-CM | POA: Diagnosis not present

## 2013-11-04 DIAGNOSIS — S62309A Unspecified fracture of unspecified metacarpal bone, initial encounter for closed fracture: Secondary | ICD-10-CM | POA: Diagnosis not present

## 2013-11-04 DIAGNOSIS — Y929 Unspecified place or not applicable: Secondary | ICD-10-CM | POA: Diagnosis not present

## 2013-11-04 DIAGNOSIS — S62319A Displaced fracture of base of unspecified metacarpal bone, initial encounter for closed fracture: Secondary | ICD-10-CM | POA: Diagnosis not present

## 2013-11-04 DIAGNOSIS — X58XXXA Exposure to other specified factors, initial encounter: Secondary | ICD-10-CM | POA: Diagnosis not present

## 2013-11-12 DIAGNOSIS — S62309A Unspecified fracture of unspecified metacarpal bone, initial encounter for closed fracture: Secondary | ICD-10-CM | POA: Diagnosis not present

## 2013-11-27 DIAGNOSIS — S62309A Unspecified fracture of unspecified metacarpal bone, initial encounter for closed fracture: Secondary | ICD-10-CM | POA: Diagnosis not present

## 2013-12-05 DIAGNOSIS — M25649 Stiffness of unspecified hand, not elsewhere classified: Secondary | ICD-10-CM | POA: Diagnosis not present

## 2013-12-09 DIAGNOSIS — H18419 Arcus senilis, unspecified eye: Secondary | ICD-10-CM | POA: Diagnosis not present

## 2013-12-09 DIAGNOSIS — H2510 Age-related nuclear cataract, unspecified eye: Secondary | ICD-10-CM | POA: Diagnosis not present

## 2013-12-09 DIAGNOSIS — H25049 Posterior subcapsular polar age-related cataract, unspecified eye: Secondary | ICD-10-CM | POA: Diagnosis not present

## 2013-12-09 DIAGNOSIS — Z961 Presence of intraocular lens: Secondary | ICD-10-CM | POA: Diagnosis not present

## 2013-12-09 DIAGNOSIS — H02839 Dermatochalasis of unspecified eye, unspecified eyelid: Secondary | ICD-10-CM | POA: Diagnosis not present

## 2013-12-09 DIAGNOSIS — H25019 Cortical age-related cataract, unspecified eye: Secondary | ICD-10-CM | POA: Diagnosis not present

## 2013-12-17 DIAGNOSIS — M25649 Stiffness of unspecified hand, not elsewhere classified: Secondary | ICD-10-CM | POA: Diagnosis not present

## 2013-12-26 DIAGNOSIS — H269 Unspecified cataract: Secondary | ICD-10-CM | POA: Diagnosis not present

## 2013-12-26 DIAGNOSIS — H2510 Age-related nuclear cataract, unspecified eye: Secondary | ICD-10-CM | POA: Diagnosis not present

## 2014-01-07 DIAGNOSIS — M25649 Stiffness of unspecified hand, not elsewhere classified: Secondary | ICD-10-CM | POA: Diagnosis not present

## 2014-03-24 DIAGNOSIS — E039 Hypothyroidism, unspecified: Secondary | ICD-10-CM | POA: Diagnosis not present

## 2014-03-24 DIAGNOSIS — E669 Obesity, unspecified: Secondary | ICD-10-CM | POA: Diagnosis not present

## 2014-03-24 DIAGNOSIS — Z681 Body mass index (BMI) 19 or less, adult: Secondary | ICD-10-CM | POA: Diagnosis not present

## 2014-05-12 DIAGNOSIS — Z0000 Encounter for general adult medical examination without abnormal findings: Secondary | ICD-10-CM | POA: Diagnosis not present

## 2014-07-21 DIAGNOSIS — J309 Allergic rhinitis, unspecified: Secondary | ICD-10-CM | POA: Diagnosis not present

## 2014-07-21 DIAGNOSIS — J4520 Mild intermittent asthma, uncomplicated: Secondary | ICD-10-CM | POA: Diagnosis not present

## 2014-07-21 DIAGNOSIS — Z6836 Body mass index (BMI) 36.0-36.9, adult: Secondary | ICD-10-CM | POA: Diagnosis not present

## 2014-07-27 DIAGNOSIS — Z6835 Body mass index (BMI) 35.0-35.9, adult: Secondary | ICD-10-CM | POA: Diagnosis not present

## 2014-07-27 DIAGNOSIS — R05 Cough: Secondary | ICD-10-CM | POA: Diagnosis not present

## 2014-12-16 DIAGNOSIS — Z6835 Body mass index (BMI) 35.0-35.9, adult: Secondary | ICD-10-CM | POA: Diagnosis not present

## 2014-12-16 DIAGNOSIS — I10 Essential (primary) hypertension: Secondary | ICD-10-CM | POA: Diagnosis not present

## 2014-12-16 DIAGNOSIS — E6609 Other obesity due to excess calories: Secondary | ICD-10-CM | POA: Diagnosis not present

## 2014-12-16 DIAGNOSIS — R609 Edema, unspecified: Secondary | ICD-10-CM | POA: Diagnosis not present

## 2015-01-04 DIAGNOSIS — E039 Hypothyroidism, unspecified: Secondary | ICD-10-CM | POA: Diagnosis not present

## 2015-01-04 DIAGNOSIS — M2041 Other hammer toe(s) (acquired), right foot: Secondary | ICD-10-CM | POA: Diagnosis not present

## 2015-01-04 DIAGNOSIS — E6609 Other obesity due to excess calories: Secondary | ICD-10-CM | POA: Diagnosis not present

## 2015-01-04 DIAGNOSIS — I10 Essential (primary) hypertension: Secondary | ICD-10-CM | POA: Diagnosis not present

## 2015-01-04 DIAGNOSIS — Z6835 Body mass index (BMI) 35.0-35.9, adult: Secondary | ICD-10-CM | POA: Diagnosis not present

## 2015-05-25 DIAGNOSIS — J309 Allergic rhinitis, unspecified: Secondary | ICD-10-CM | POA: Diagnosis not present

## 2015-05-25 DIAGNOSIS — J069 Acute upper respiratory infection, unspecified: Secondary | ICD-10-CM | POA: Diagnosis not present

## 2015-05-25 DIAGNOSIS — Z6835 Body mass index (BMI) 35.0-35.9, adult: Secondary | ICD-10-CM | POA: Diagnosis not present

## 2015-05-25 DIAGNOSIS — Z1389 Encounter for screening for other disorder: Secondary | ICD-10-CM | POA: Diagnosis not present

## 2015-05-25 DIAGNOSIS — J4520 Mild intermittent asthma, uncomplicated: Secondary | ICD-10-CM | POA: Diagnosis not present

## 2015-05-25 DIAGNOSIS — E6609 Other obesity due to excess calories: Secondary | ICD-10-CM | POA: Diagnosis not present

## 2015-07-08 ENCOUNTER — Other Ambulatory Visit (HOSPITAL_COMMUNITY): Payer: Self-pay | Admitting: Family Medicine

## 2015-07-08 ENCOUNTER — Ambulatory Visit (HOSPITAL_COMMUNITY)
Admission: RE | Admit: 2015-07-08 | Discharge: 2015-07-08 | Disposition: A | Discharge status: Home / Self Care | Payer: Medicare Other | Source: Ambulatory Visit | Attending: Family Medicine | Admitting: Family Medicine

## 2015-07-08 DIAGNOSIS — Z1389 Encounter for screening for other disorder: Secondary | ICD-10-CM | POA: Diagnosis not present

## 2015-07-08 DIAGNOSIS — M7989 Other specified soft tissue disorders: Secondary | ICD-10-CM | POA: Insufficient documentation

## 2015-07-08 DIAGNOSIS — M25532 Pain in left wrist: Secondary | ICD-10-CM

## 2015-07-08 DIAGNOSIS — Z6836 Body mass index (BMI) 36.0-36.9, adult: Secondary | ICD-10-CM | POA: Diagnosis not present

## 2015-07-08 DIAGNOSIS — M79641 Pain in right hand: Secondary | ICD-10-CM | POA: Diagnosis not present

## 2015-07-08 DIAGNOSIS — M25531 Pain in right wrist: Secondary | ICD-10-CM

## 2015-07-08 DIAGNOSIS — M79642 Pain in left hand: Secondary | ICD-10-CM | POA: Diagnosis not present

## 2015-08-19 DIAGNOSIS — M064 Inflammatory polyarthropathy: Secondary | ICD-10-CM | POA: Diagnosis not present

## 2015-08-19 DIAGNOSIS — Z1389 Encounter for screening for other disorder: Secondary | ICD-10-CM | POA: Diagnosis not present

## 2015-08-19 DIAGNOSIS — E6609 Other obesity due to excess calories: Secondary | ICD-10-CM | POA: Diagnosis not present

## 2015-08-19 DIAGNOSIS — Z6835 Body mass index (BMI) 35.0-35.9, adult: Secondary | ICD-10-CM | POA: Diagnosis not present

## 2015-08-22 HISTORY — PX: FOOT SURGERY: SHX648

## 2015-09-02 DIAGNOSIS — Z6835 Body mass index (BMI) 35.0-35.9, adult: Secondary | ICD-10-CM | POA: Diagnosis not present

## 2015-09-02 DIAGNOSIS — R768 Other specified abnormal immunological findings in serum: Secondary | ICD-10-CM | POA: Diagnosis not present

## 2015-09-02 DIAGNOSIS — E6609 Other obesity due to excess calories: Secondary | ICD-10-CM | POA: Diagnosis not present

## 2015-09-02 DIAGNOSIS — M2041 Other hammer toe(s) (acquired), right foot: Secondary | ICD-10-CM | POA: Diagnosis not present

## 2015-09-02 DIAGNOSIS — Z1389 Encounter for screening for other disorder: Secondary | ICD-10-CM | POA: Diagnosis not present

## 2015-09-20 ENCOUNTER — Ambulatory Visit (INDEPENDENT_AMBULATORY_CARE_PROVIDER_SITE_OTHER): Payer: Medicare Other

## 2015-09-20 ENCOUNTER — Encounter: Payer: Self-pay | Admitting: Podiatry

## 2015-09-20 ENCOUNTER — Ambulatory Visit (INDEPENDENT_AMBULATORY_CARE_PROVIDER_SITE_OTHER): Payer: Medicare Other | Admitting: Podiatry

## 2015-09-20 VITALS — BP 143/79 | HR 76 | Resp 16 | Ht 68.5 in | Wt 225.0 lb

## 2015-09-20 DIAGNOSIS — M2040 Other hammer toe(s) (acquired), unspecified foot: Secondary | ICD-10-CM

## 2015-09-20 NOTE — Progress Notes (Signed)
   Subjective:    Patient ID: Jacqueline Casey, female    DOB: 1946/01/03, 69 y.o.   MRN: [id number]  HPI Patient presents with foot pain in their right foot; Hammertoe; 2nd toe. Pt stated, "getting worse over time"; x1 yr.   Review of Systems  Musculoskeletal: Positive for myalgias.  Hematological: Bruises/bleeds easily.  All other systems reviewed and are negative.      Objective:   Physical Exam        Assessment & Plan:

## 2015-09-20 NOTE — Progress Notes (Signed)
Subjective:     Patient ID: Jacqueline Casey, female   DOB: [birth year], 70 y.o.   MRN: [id number]  HPI patient presents with a painful second toe right that's rigidly contracted and increasingly making shoe gear difficult. Has tried shoe gear modifications padding and soaks without relief of symptoms   Review of Systems  All other systems reviewed and are negative.      Objective:   Physical Exam  Constitutional: She is oriented to person, place, and time.  Cardiovascular: Intact distal pulses.   Musculoskeletal: Normal range of motion.  Neurological: She is oriented to person, place, and time.  Skin: Skin is warm.  Nursing note and vitals reviewed.  neurovascular status intact muscle strength was adequate with range of motion within normal limits with patient noted to have rigid contracture second digit right that she states is occurred in the last year. It is also slightly medially dislocated with redness on top of the proximal interphalangeal joint and pain when palpated. Good digital perfusion and well oriented 3     Assessment:      hammertoe deformity with probable flexor plate stretch or dislocation right second toe with rigid contracture and pain within the digit itself    Plan:      H&P and x-rays reviewed with patient. At this point due to long-standing nature patient wants this fixed and I recommended digital fusion explaining that the toe may lift somewhat after procedure. Patient is willing to undergo procedure understanding risk and I allowed her to sign consent form after reviewing alternative treatments complications. Understands to of the pin and the toe for proximally 5 weeks and the told recovery. We'll take approximate 6 months and that there is no long-term guarantees and that all complications do exist. Signs consent form and is scheduled for outpatient surgery

## 2015-09-20 NOTE — Patient Instructions (Signed)
Pre-Operative Instructions  Congratulations, you have decided to take an important step to improving your quality of life.  You can be assured that the doctors of [facility] will be with you every step of the way.  1. Plan to be at the surgery center/hospital at least 1 (one) hour prior to your scheduled time unless otherwise directed by the surgical center/hospital staff.  You must have a responsible adult accompany you, remain during the surgery and drive you home.  Make sure you have directions to the surgical center/hospital and know how to get there on time. 2. For hospital based surgery you will need to obtain a history and physical form from your family physician within 1 month prior to the date of surgery- we will give you a form for you primary physician.  3. We make every effort to accommodate the date you request for surgery.  There are however, times where surgery dates or times have to be moved.  We will contact you as soon as possible if a change in schedule is required.   4. No Aspirin/Ibuprofen for one week before surgery.  If you are on aspirin, any non-steroidal anti-inflammatory medications (Mobic, Aleve, Ibuprofen) you should stop taking it 7 days prior to your surgery.  You make take Tylenol  For pain prior to surgery.  5. Medications- If you are taking daily heart and blood pressure medications, seizure, reflux, allergy, asthma, anxiety, pain or diabetes medications, make sure the surgery center/hospital is aware before the day of surgery so they may notify you which medications to take or avoid the day of surgery. 6. No food or drink after midnight the night before surgery unless directed otherwise by surgical center/hospital staff. 7. No alcoholic beverages 24 hours prior to surgery.  No smoking 24 hours prior to or 24 hours after surgery. 8. Wear loose pants or shorts- loose enough to fit over bandages, boots, and casts. 9. No slip on shoes, sneakers are best. 10. Bring  your boot with you to the surgery center/hospital.  Also bring crutches or a walker if your physician has prescribed it for you.  If you do not have this equipment, it will be provided for you after surgery. 11. If you have not been contracted by the surgery center/hospital by the day before your surgery, call to confirm the date and time of your surgery. 12. Leave-time from work may vary depending on the type of surgery you have.  Appropriate arrangements should be made prior to surgery with your employer. 13. Prescriptions will be provided immediately following surgery by your doctor.  Have these filled as soon as possible after surgery and take the medication as directed. 14. Remove nail polish on the operative foot. 15. Wash the night before surgery.  The night before surgery wash the foot and leg well with the antibacterial soap provided and water paying special attention to beneath the toenails and in between the toes.  Rinse thoroughly with water and dry well with a towel.  Perform this wash unless told not to do so by your physician.  Enclosed: 1 Ice pack (please put in freezer the night before surgery)   1 Hibiclens skin cleaner   Pre-op Instructions  If you have any questions regarding the instructions, do not hesitate to call our office.  [place]: [address] [place], [place] [address] [phone]  [place]: [address] [place], [place] [address] [phone]  [place]: [address]  [place], [place] [address] [phone]  Dr. Richard   Tuchman DPM, Dr. Norman Regal DPM Dr. Richard Sikora DPM, Dr. M. Todd Hyatt DPM, Dr. Kathryn Egerton DPM 

## 2015-09-28 ENCOUNTER — Encounter: Payer: Self-pay | Admitting: Podiatry

## 2015-09-28 DIAGNOSIS — M2041 Other hammer toe(s) (acquired), right foot: Secondary | ICD-10-CM | POA: Diagnosis not present

## 2015-09-28 DIAGNOSIS — I10 Essential (primary) hypertension: Secondary | ICD-10-CM | POA: Diagnosis not present

## 2015-10-07 ENCOUNTER — Ambulatory Visit (INDEPENDENT_AMBULATORY_CARE_PROVIDER_SITE_OTHER): Payer: Medicare Other

## 2015-10-07 ENCOUNTER — Ambulatory Visit (INDEPENDENT_AMBULATORY_CARE_PROVIDER_SITE_OTHER): Payer: Medicare Other | Admitting: Podiatry

## 2015-10-07 VITALS — Temp 98.4°F

## 2015-10-07 DIAGNOSIS — M2040 Other hammer toe(s) (acquired), unspecified foot: Secondary | ICD-10-CM | POA: Diagnosis not present

## 2015-10-07 DIAGNOSIS — Z9889 Other specified postprocedural states: Secondary | ICD-10-CM

## 2015-10-07 NOTE — Progress Notes (Signed)
Subjective:     Patient ID: Jacqueline Casey, female   DOB: 1945/11/08, 70 y.o.   MRN: [id number]  HPI patient states I'm doing excellent with pin in place and having minimal discomfort   Review of Systems     Objective:   Physical Exam Neurovascular status intact negative Homans sign noted pin in place second toe with wound edges well coapted stitches in place    Assessment:     Doing well post digital fusion digit 2 right    Plan:     Applied sterile dressing and instructed on continuing this for the next several weeks and reappoint for suture removal. Reappoint earlier if needed and reviewed x-rays  X-ray report indicates pins in good place digital has adequate perfusion with good alignment

## 2015-10-13 ENCOUNTER — Ambulatory Visit (INDEPENDENT_AMBULATORY_CARE_PROVIDER_SITE_OTHER): Payer: Medicare Other | Admitting: Podiatry

## 2015-10-13 DIAGNOSIS — Z9889 Other specified postprocedural states: Secondary | ICD-10-CM | POA: Diagnosis not present

## 2015-10-13 DIAGNOSIS — M2040 Other hammer toe(s) (acquired), unspecified foot: Secondary | ICD-10-CM

## 2015-10-14 NOTE — Progress Notes (Signed)
Subjective:     Patient ID: Jacqueline Casey, female   DOB: [birth year], 70 y.o.   MRN: [id number]  HPI patient states my toe is doing fine   Review of Systems     Objective:   Physical Exam Neurovascular status intact with second digit in good alignment with pin in place and wound edges well coapted with stitches in place    Assessment:     Doing well post digital fusion digit 2 right    Plan:     Stitches removed and we will continue to plantarflexed the second toe and she'll reappoint in 3 weeks for pin removal or earlier if needed

## 2015-10-16 IMAGING — CR DG HAND COMPLETE 3+V*L*
3 series · 3 of 3 positions shown · non-contrast
Comparison: none

[view not recorded (1 of 3)]
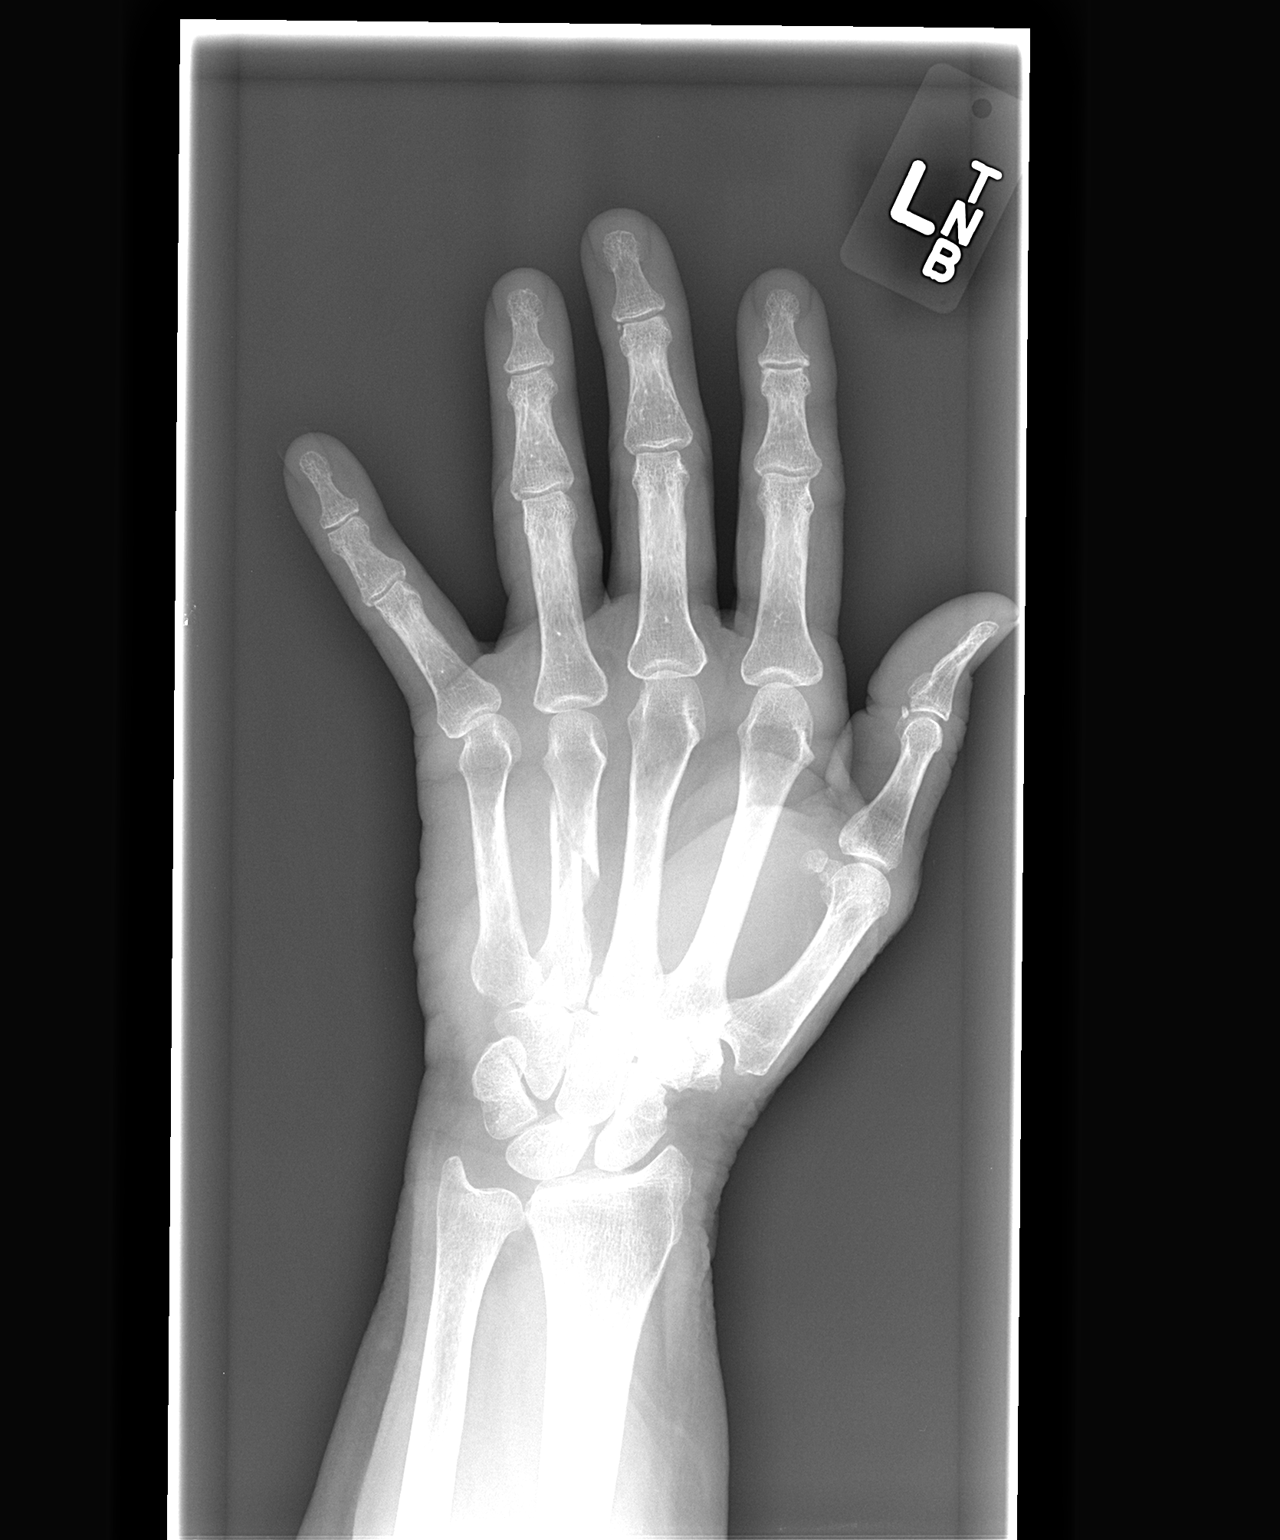

[view not recorded (2 of 3)]
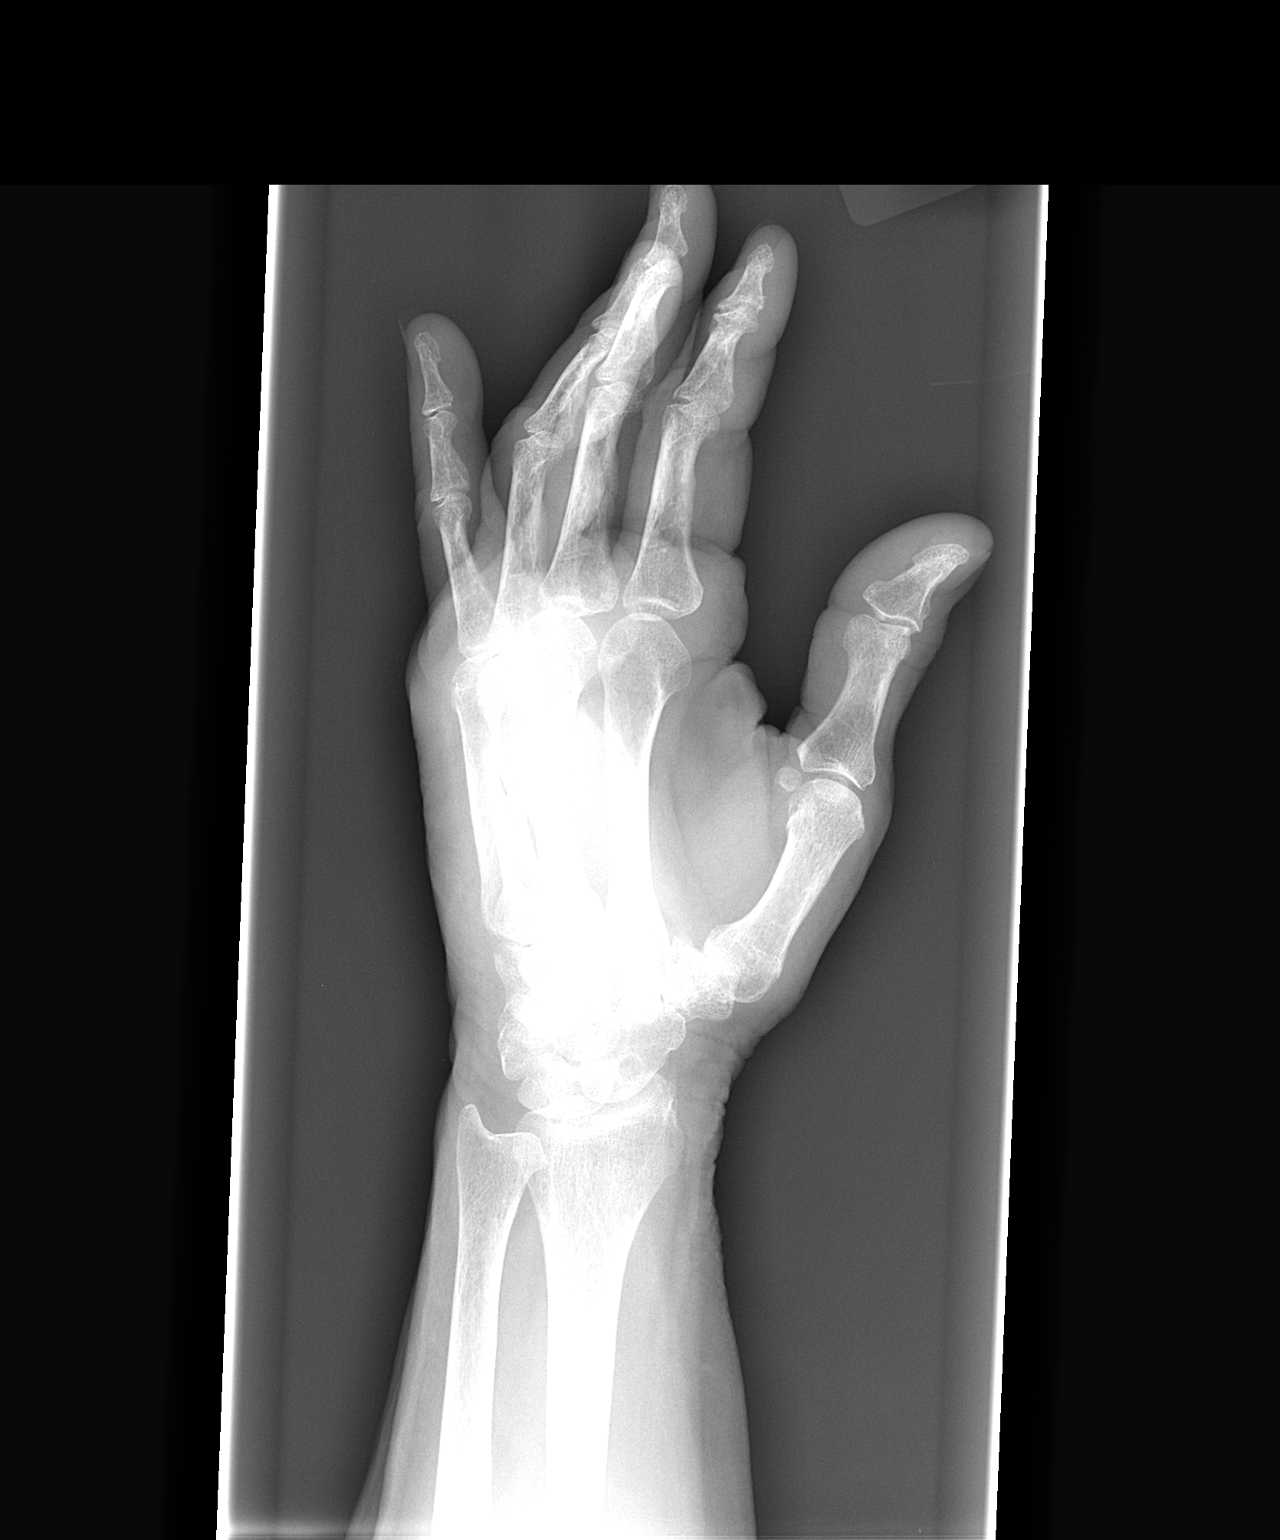

[view not recorded (3 of 3)]
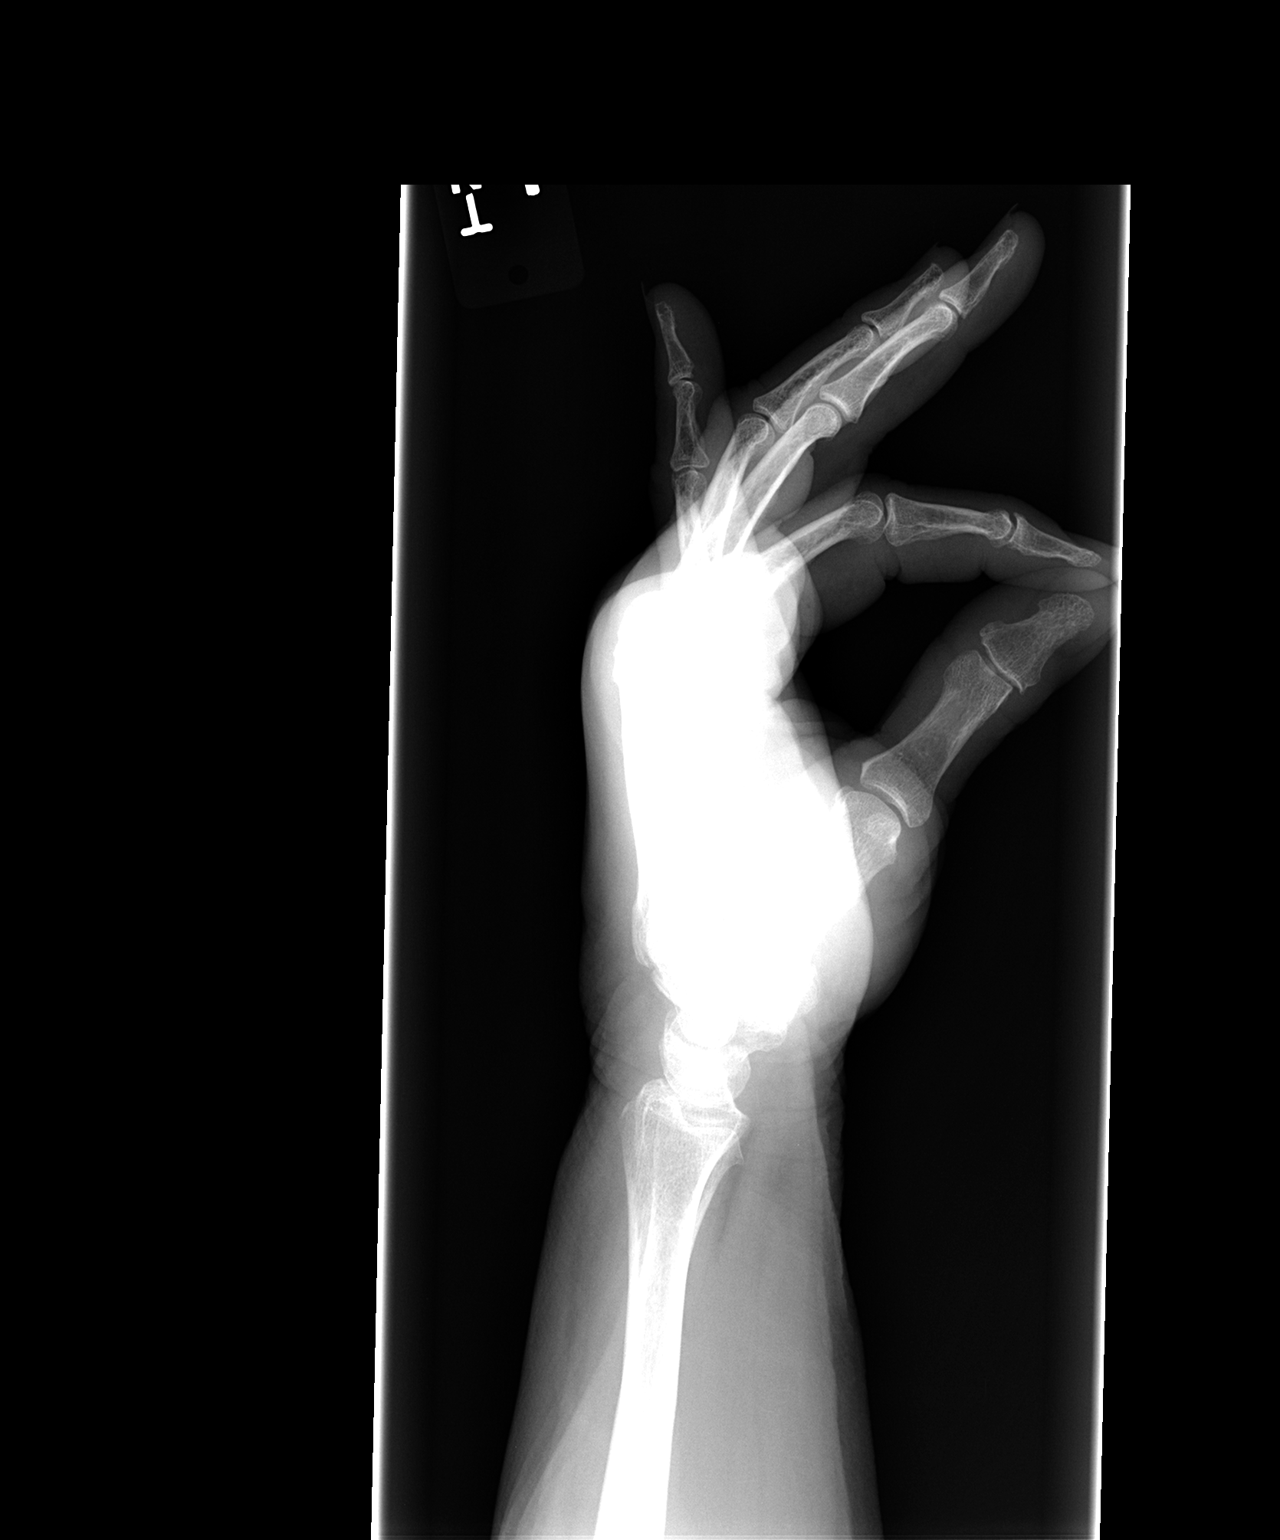

[3 of 3 positions shown; findings below may reference images not displayed]

CLINICAL DATA
Bruising and swelling left hand, fell 1 week ago

EXAM
LEFT HAND - COMPLETE 3+ VIEW

COMPARISON
None

FINDINGS
Diffuse osseous demineralization.

Oblique fracture fourth metacarpal diaphysis, mildly displaced
radial and volar with overriding and foreshortening.

Joint spaces preserved.

No additional fracture or dislocation.

Small cyst within scaphoid.

IMPRESSION
Displaced oblique left fourth metacarpal diaphyseal fracture.

SIGNATURE

## 2015-10-20 DIAGNOSIS — Z79899 Other long term (current) drug therapy: Secondary | ICD-10-CM | POA: Diagnosis not present

## 2015-10-20 DIAGNOSIS — M810 Age-related osteoporosis without current pathological fracture: Secondary | ICD-10-CM | POA: Diagnosis not present

## 2015-10-20 DIAGNOSIS — M79641 Pain in right hand: Secondary | ICD-10-CM | POA: Diagnosis not present

## 2015-10-20 DIAGNOSIS — M79642 Pain in left hand: Secondary | ICD-10-CM | POA: Diagnosis not present

## 2015-10-20 DIAGNOSIS — M1990 Unspecified osteoarthritis, unspecified site: Secondary | ICD-10-CM | POA: Diagnosis not present

## 2015-10-20 DIAGNOSIS — R52 Pain, unspecified: Secondary | ICD-10-CM | POA: Diagnosis not present

## 2015-10-20 DIAGNOSIS — R5381 Other malaise: Secondary | ICD-10-CM | POA: Diagnosis not present

## 2015-10-20 DIAGNOSIS — M25561 Pain in right knee: Secondary | ICD-10-CM | POA: Diagnosis not present

## 2015-10-20 DIAGNOSIS — M79672 Pain in left foot: Secondary | ICD-10-CM | POA: Diagnosis not present

## 2015-10-20 DIAGNOSIS — M25562 Pain in left knee: Secondary | ICD-10-CM | POA: Diagnosis not present

## 2015-10-20 DIAGNOSIS — M2550 Pain in unspecified joint: Secondary | ICD-10-CM | POA: Diagnosis not present

## 2015-11-04 ENCOUNTER — Encounter: Payer: Self-pay | Admitting: Podiatry

## 2015-11-04 ENCOUNTER — Ambulatory Visit (INDEPENDENT_AMBULATORY_CARE_PROVIDER_SITE_OTHER): Payer: Medicare Other

## 2015-11-04 ENCOUNTER — Ambulatory Visit (INDEPENDENT_AMBULATORY_CARE_PROVIDER_SITE_OTHER): Payer: Medicare Other | Admitting: Podiatry

## 2015-11-04 VITALS — BP 149/87 | HR 95 | Resp 16

## 2015-11-04 DIAGNOSIS — M2040 Other hammer toe(s) (acquired), unspecified foot: Secondary | ICD-10-CM | POA: Diagnosis not present

## 2015-11-04 DIAGNOSIS — Z9889 Other specified postprocedural states: Secondary | ICD-10-CM

## 2015-11-09 NOTE — Progress Notes (Signed)
Subjective:     Patient ID: Jacqueline Casey, female   DOB: 1946/02/11, 70 y.o.   MRN: [id number]  HPI patient states I'm doing well with my right foot with the pin in place toes in good alignment and no discomfort   Review of Systems     Objective:   Physical Exam Neurovascular status intact with good digital perfusion and second toe in good alignment with pin in place with wound edges well coapted    Assessment:     Doing well post digital fusion digit 2 right    Plan:     Pin removed instructed on physical therapy for this and reviewed x-rays. Reappoint for [place] to recheck 4 weeks or earlier if needed  X-ray report indicated that the toes in good alignment with slight medial rotation but should not be a problem long-term for function

## 2015-11-22 DIAGNOSIS — M25561 Pain in right knee: Secondary | ICD-10-CM | POA: Diagnosis not present

## 2015-11-22 DIAGNOSIS — M359 Systemic involvement of connective tissue, unspecified: Secondary | ICD-10-CM | POA: Diagnosis not present

## 2015-11-22 DIAGNOSIS — R5383 Other fatigue: Secondary | ICD-10-CM | POA: Diagnosis not present

## 2015-11-22 DIAGNOSIS — Z09 Encounter for follow-up examination after completed treatment for conditions other than malignant neoplasm: Secondary | ICD-10-CM | POA: Diagnosis not present

## 2015-11-22 DIAGNOSIS — M2550 Pain in unspecified joint: Secondary | ICD-10-CM | POA: Diagnosis not present

## 2015-11-22 DIAGNOSIS — G5601 Carpal tunnel syndrome, right upper limb: Secondary | ICD-10-CM | POA: Diagnosis not present

## 2015-11-25 DIAGNOSIS — M1990 Unspecified osteoarthritis, unspecified site: Secondary | ICD-10-CM | POA: Diagnosis not present

## 2015-11-25 DIAGNOSIS — Z1389 Encounter for screening for other disorder: Secondary | ICD-10-CM | POA: Diagnosis not present

## 2015-11-25 DIAGNOSIS — Z6835 Body mass index (BMI) 35.0-35.9, adult: Secondary | ICD-10-CM | POA: Diagnosis not present

## 2015-11-30 DIAGNOSIS — H43313 Vitreous membranes and strands, bilateral: Secondary | ICD-10-CM | POA: Diagnosis not present

## 2015-11-30 DIAGNOSIS — H43399 Other vitreous opacities, unspecified eye: Secondary | ICD-10-CM | POA: Diagnosis not present

## 2015-12-07 DIAGNOSIS — M19241 Secondary osteoarthritis, right hand: Secondary | ICD-10-CM | POA: Diagnosis not present

## 2015-12-07 DIAGNOSIS — M359 Systemic involvement of connective tissue, unspecified: Secondary | ICD-10-CM | POA: Diagnosis not present

## 2015-12-07 DIAGNOSIS — M79641 Pain in right hand: Secondary | ICD-10-CM | POA: Diagnosis not present

## 2016-04-14 DIAGNOSIS — E6609 Other obesity due to excess calories: Secondary | ICD-10-CM | POA: Diagnosis not present

## 2016-04-14 DIAGNOSIS — I10 Essential (primary) hypertension: Secondary | ICD-10-CM | POA: Diagnosis not present

## 2016-04-14 DIAGNOSIS — Z6836 Body mass index (BMI) 36.0-36.9, adult: Secondary | ICD-10-CM | POA: Diagnosis not present

## 2016-04-14 DIAGNOSIS — M359 Systemic involvement of connective tissue, unspecified: Secondary | ICD-10-CM | POA: Diagnosis not present

## 2016-04-14 DIAGNOSIS — E063 Autoimmune thyroiditis: Secondary | ICD-10-CM | POA: Diagnosis not present

## 2016-04-26 DIAGNOSIS — Z1389 Encounter for screening for other disorder: Secondary | ICD-10-CM | POA: Diagnosis not present

## 2016-04-26 DIAGNOSIS — L03115 Cellulitis of right lower limb: Secondary | ICD-10-CM | POA: Diagnosis not present

## 2016-04-26 DIAGNOSIS — Z6836 Body mass index (BMI) 36.0-36.9, adult: Secondary | ICD-10-CM | POA: Diagnosis not present

## 2016-04-26 DIAGNOSIS — Z0000 Encounter for general adult medical examination without abnormal findings: Secondary | ICD-10-CM | POA: Diagnosis not present

## 2016-05-02 NOTE — Progress Notes (Signed)
DOS 02.07.2017 Fusion with pin 2nd toe right foot

## 2016-12-28 DIAGNOSIS — I10 Essential (primary) hypertension: Secondary | ICD-10-CM | POA: Diagnosis not present

## 2016-12-28 DIAGNOSIS — Z1389 Encounter for screening for other disorder: Secondary | ICD-10-CM | POA: Diagnosis not present

## 2016-12-28 DIAGNOSIS — E063 Autoimmune thyroiditis: Secondary | ICD-10-CM | POA: Diagnosis not present

## 2016-12-28 DIAGNOSIS — Z6835 Body mass index (BMI) 35.0-35.9, adult: Secondary | ICD-10-CM | POA: Diagnosis not present

## 2017-02-15 DIAGNOSIS — Z1211 Encounter for screening for malignant neoplasm of colon: Secondary | ICD-10-CM | POA: Diagnosis not present

## 2017-03-09 DIAGNOSIS — Z1231 Encounter for screening mammogram for malignant neoplasm of breast: Secondary | ICD-10-CM | POA: Diagnosis not present

## 2017-04-27 DIAGNOSIS — M069 Rheumatoid arthritis, unspecified: Secondary | ICD-10-CM | POA: Diagnosis not present

## 2017-04-27 DIAGNOSIS — E6609 Other obesity due to excess calories: Secondary | ICD-10-CM | POA: Diagnosis not present

## 2017-04-27 DIAGNOSIS — I10 Essential (primary) hypertension: Secondary | ICD-10-CM | POA: Diagnosis not present

## 2017-04-27 DIAGNOSIS — Z1389 Encounter for screening for other disorder: Secondary | ICD-10-CM | POA: Diagnosis not present

## 2017-04-27 DIAGNOSIS — Z6835 Body mass index (BMI) 35.0-35.9, adult: Secondary | ICD-10-CM | POA: Diagnosis not present

## 2017-05-01 DIAGNOSIS — Z1389 Encounter for screening for other disorder: Secondary | ICD-10-CM | POA: Diagnosis not present

## 2017-05-01 DIAGNOSIS — E063 Autoimmune thyroiditis: Secondary | ICD-10-CM | POA: Diagnosis not present

## 2017-05-01 DIAGNOSIS — I10 Essential (primary) hypertension: Secondary | ICD-10-CM | POA: Diagnosis not present

## 2017-07-02 DIAGNOSIS — E6609 Other obesity due to excess calories: Secondary | ICD-10-CM | POA: Diagnosis not present

## 2017-07-02 DIAGNOSIS — J069 Acute upper respiratory infection, unspecified: Secondary | ICD-10-CM | POA: Diagnosis not present

## 2017-07-02 DIAGNOSIS — J302 Other seasonal allergic rhinitis: Secondary | ICD-10-CM | POA: Diagnosis not present

## 2017-07-02 DIAGNOSIS — Z6835 Body mass index (BMI) 35.0-35.9, adult: Secondary | ICD-10-CM | POA: Diagnosis not present

## 2017-08-16 DIAGNOSIS — E6609 Other obesity due to excess calories: Secondary | ICD-10-CM | POA: Diagnosis not present

## 2017-08-16 DIAGNOSIS — I10 Essential (primary) hypertension: Secondary | ICD-10-CM | POA: Diagnosis not present

## 2017-08-16 DIAGNOSIS — J3089 Other allergic rhinitis: Secondary | ICD-10-CM | POA: Diagnosis not present

## 2017-08-16 DIAGNOSIS — E063 Autoimmune thyroiditis: Secondary | ICD-10-CM | POA: Diagnosis not present

## 2017-08-16 DIAGNOSIS — Z6835 Body mass index (BMI) 35.0-35.9, adult: Secondary | ICD-10-CM | POA: Diagnosis not present

## 2017-08-16 DIAGNOSIS — M069 Rheumatoid arthritis, unspecified: Secondary | ICD-10-CM | POA: Diagnosis not present

## 2017-08-28 NOTE — Progress Notes (Signed)
Office Visit Note  Patient: Jacqueline Casey             Date of Birth: March 27, 1946           MRN: [id number]             PCP: Jake Samples, PA-C Referring: Jake Samples, PA* Visit Date: 09/10/2017 Occupation: @GUAROCC @    Subjective:  Pain in bilateral hands and wrists    History of Present Illness: Jacqueline Casey is a 72 y.o. female with history of autoimmune disease and osteoarthritis.  Patient hasn't been seen since April 2017.  She had synovitis of bilateral wrists at previous visit.  There was a discussion of starting Plaquenil, but she was hesistant to start due to side effects.  She is having increased pain and swelling in her bilateral hands and wrists.  She states she has pain in multiple joints and occasional swelling in her knees.  She states sometimes the pain in her knees is so severe she has to use a walker.  She denies taking Prednisone in a long time.  She has started wearing copper gloves.   Denies rash, oral or nasal ulcers,or  photosensitivity.   Activities of Daily Living:  Patient reports morning stiffness for  all day.   Patient Denies nocturnal pain.  Difficulty dressing/grooming: Denies Difficulty climbing stairs: Reports Difficulty getting out of chair: Reports Difficulty using hands for taps, buttons, cutlery, and/or writing: Reports   Review of Systems  Constitutional: Negative for fatigue and weakness.  HENT: Positive for mouth dryness. Negative for mouth sores and nose dryness.   Eyes: Positive for redness. Negative for visual disturbance and dryness.  Respiratory: Negative for cough, hemoptysis, shortness of breath and difficulty breathing.   Cardiovascular: Negative for chest pain, palpitations, hypertension, irregular heartbeat and swelling in legs/feet.  Gastrointestinal: Negative for blood in stool, constipation and diarrhea.  Endocrine: Negative for increased urination.  Genitourinary: Negative for painful urination.    Musculoskeletal: Positive for arthralgias, joint pain, joint swelling and morning stiffness. Negative for myalgias, muscle weakness, muscle tenderness and myalgias.  Skin: Negative for color change, pallor, rash, hair loss, nodules/bumps, redness, skin tightness, ulcers and sensitivity to sunlight.  Neurological: Negative for dizziness, numbness and headaches.  Hematological: Negative for swollen glands.  Psychiatric/Behavioral: Negative for depressed mood and sleep disturbance. The patient is not nervous/anxious.     PMFS History:  Patient Active Problem List   Diagnosis Date Noted  . Baker's cyst of knee 10/18/2011  . Knee mass 10/04/2011    Past Medical History:  Diagnosis Date  . Baker's cyst of knee 10/18/2011  . Hypertension   . Thyroid disease     Family History  Problem Relation Age of Onset  . Cancer Unknown   . Diabetes Unknown   . Cancer Mother        breast  . COPD Mother   . Leukemia Father   . Osteoporosis Sister   . Asthma Sister   . Dermatomyositis Sister   . COPD Sister   . Kidney disease Sister   . Cancer Son        lymphoma, colon  . COPD Daughter   . Rheum arthritis Daughter    Past Surgical History:  Procedure Laterality Date  . FOOT SURGERY Right 2017   hammer toe   . HAND SURGERY Left 2014   finger, hand  . THYROID SURGERY    . TUBAL LIGATION    . VAGINAL HYSTERECTOMY  Social History   Social History Narrative  . Not on file     Objective: Vital Signs: BP 126/79 (BP Location: Left Arm, Patient Position: Sitting, Cuff Size: Normal)   Pulse 87   Resp 17   Ht 5' 8.5" (1.74 m)   Wt 236 lb (107 kg)   BMI 35.36 kg/m    Physical Exam  Constitutional: She is oriented to person, place, and time. She appears well-developed and well-nourished.  HENT:  Head: Normocephalic and atraumatic.  Erythema over malar region  Eyes: Conjunctivae and EOM are normal.  Neck: Normal range of motion.  Cardiovascular: Normal rate, regular rhythm,  normal heart sounds and intact distal pulses.  Pulmonary/Chest: Effort normal and breath sounds normal.  Abdominal: Soft. Bowel sounds are normal.  Lymphadenopathy:    She has no cervical adenopathy.  Neurological: She is alert and oriented to person, place, and time.  Skin: Skin is warm and dry. Capillary refill takes less than 2 seconds.  Psychiatric: She has a normal mood and affect. Her behavior is normal.  Nursing note and vitals reviewed.    Musculoskeletal Exam: C-spine, thoracic, and lumbar spine good ROM.  Shoulder joints, elbow joints good ROM. Bilateral wrists limited ROM with synovitis.  2nd and 3rd MCP synovial thickening bilaterally.  She has DIP synovial thickening consistent with osteoarthritis.  Hip joints, knee joints, ankle joints, MTPs, PIPs, and DIPs good ROM.  Knee crepitus bilaterally.  Right knee warmth.  No midline spinal tenderness.  No SI joint tenderness.  No trochanteric bursitis.    CDAI Exam: CDAI Homunculus Exam:   Tenderness:  RUE: wrist LUE: wrist Right hand: 2nd MCP and 3rd MCP Left hand: 2nd MCP and 3rd MCP RLE: tibiofemoral LLE: tibiofemoral  Swelling:  RUE: wrist LUE: wrist Right hand: 2nd MCP Left hand: 2nd MCP  Joint Counts:  CDAI Tender Joint count: 8 CDAI Swollen Joint count: 4  Global Assessments:  Patient Global Assessment: 6 Provider Global Assessment: 5  CDAI Calculated Score: 23    Investigation: No additional findings.   Imaging: Xr Foot 2 Views Left  Result Date: 09/10/2017 First MTP, all PIP/DIP narrowing was noted. No erosive changes were noted. Dorsal spurring was noted. Inferior calcaneal spur was noted. No intertarsal joint space narrowing was noted. Impression: these findings are consistent with osteoarthritis of the foot.  Xr Foot 2 Views Right  Result Date: 09/10/2017 First MTP, all PIP/DIP narrowing was noted. No erosive changes were noted. Dorsal spurring was noted. Inferior calcaneal spur was noted. No  intertarsal joint space narrowing was noted. Impression: these findings are consistent with osteoarthritis of the foot.  Xr Hand 2 View Left  Result Date: 09/10/2017 [facility] joint narrowing and mild DIP narrowing and DIP narrowing was noted. No significant MCP joint narrowing was noted. Hardware noted in the fourth metacarpal. No intercarpal radiocarpal joint space narrowing was noted. No erosive changes were noted. Impression: These findings are consistent with osteoarthritis of the hand.  Xr Hand 2 View Right  Result Date: 09/10/2017 Right second and third MCP narrowing was noted. Juxta articular osteopenia was noted. Mild PIP and DIP joint space narrowing was noted. No significant intercarpal radiocarpal joint space narrowing was noted. Cystic changes noted in the carpal bones. Impression: These funds are consistent with osteoarthritis and inflammatory arthritis overlap.   Speciality Comments: No specialty comments available.    Procedures:  No procedures performed Allergies: Sulfa antibiotics     Assessment / Plan:     Visit Diagnoses: Autoimmune  disease ([place]) - She has erythema in malar region.  She has no oral ulcers or nasal ulcers.  No lymphadenopathy.  She has synovitis of bilateral wrists and pain in multiple joints as described above different treatment options and their side effects were discussed. She was in agreement to proceed with Plaquenil..  Autoimmune labs were drawn today.  After her lab results are back, she will be started on Plaquenil 200 mg BID daily. Plan: Urinalysis, Routine w reflex microscopic  Patient was counseled on the purpose, proper use, and adverse effects of hydroxychloroquine including nausea/diarrhea, skin rash, headaches, and sun sensitivity.  Discussed importance of annual eye exams while on hydroxychloroquine to monitor to ocular toxicity and discussed importance of frequent laboratory monitoring.  Provided patient with eye exam form for baseline  ophthalmologic exam.  Provided patient with educational materials on hydroxychloroquine and answered all questions.  Patient consented to hydroxychloroquine.  Will upload consent in the media tab.    Positive ANA (antinuclear antibody) - 1:160 homogeneous - Autoimmune labs were drawn today.  CBC and CMP were drawn today before starting PLQ.  She was given an eye exam form to take with her.  Plan: Sedimentation rate, ANA, Cardiolipin antibodies, IgG, IgM, IgA, Beta-2 glycoprotein antibodies, C3 and C4, Anti-DNA antibody, double-stranded, Sjogrens syndrome-B extractable nuclear antibody, Sjogrens syndrome-A extractable nuclear antibody, Anti-Smith antibody, RNP Antibody, Anti-scleroderma antibody, CBC with Differential/Platelet, COMPLETE METABOLIC PANEL WITH GFR  Primary osteoarthritis of both hands: She has PIP and DIP synovial thickening consistent with osteoarthritis. Joint protection and muscle strengthening were discussed.    Pain in both hands - Plan: XR Hand 2 View Right, XR Hand 2 View Left, Cyclic citrul peptide antibody, IgG, Rheumatoid factor, 14-3-3 eta Protein, Sedimentation rate  Pain in both feet - Plan: XR Foot 2 Views Right, XR Foot 2 Views Left   Other medical conditions are listed as follows:   History of hypertension  History of hypothyroidism     Orders: Orders Placed This Encounter  Procedures  . XR Hand 2 View Right  . XR Hand 2 View Left  . XR Foot 2 Views Right  . XR Foot 2 Views Left  . Cyclic citrul peptide antibody, IgG  . Rheumatoid factor  . 14-3-3 eta Protein  . Sedimentation rate  . ANA  . Cardiolipin antibodies, IgG, IgM, IgA  . Beta-2 glycoprotein antibodies  . C3 and C4  . Anti-DNA antibody, double-stranded  . Sjogrens syndrome-B extractable nuclear antibody  . Sjogrens syndrome-A extractable nuclear antibody  . Anti-Smith antibody  . RNP Antibody  . Anti-scleroderma antibody  . CBC with Differential/Platelet  . COMPLETE METABOLIC PANEL WITH  GFR  . Urinalysis, Routine w reflex microscopic  . CBC with Differential/Platelet  . COMPLETE METABOLIC PANEL WITH GFR   No orders of the defined types were placed in this encounter.   Face-to-face time spent with patient was 30 minutes. Greater than 50% of time was spent in counseling and coordination of care.  Follow-Up Instructions: Return in about 3 months (around 12/09/2017) for Autoimmune disease.   Bo Merino, MD  Note - This record has been created using Editor, commissioning.  Chart creation errors have been sought, but may not always  have been located. Such creation errors do not reflect on  the standard of medical care.

## 2017-09-10 ENCOUNTER — Ambulatory Visit (INDEPENDENT_AMBULATORY_CARE_PROVIDER_SITE_OTHER): Payer: Medicare Other | Admitting: Rheumatology

## 2017-09-10 ENCOUNTER — Encounter: Payer: Self-pay | Admitting: Rheumatology

## 2017-09-10 ENCOUNTER — Ambulatory Visit (INDEPENDENT_AMBULATORY_CARE_PROVIDER_SITE_OTHER): Payer: Self-pay

## 2017-09-10 VITALS — BP 126/79 | HR 87 | Resp 17 | Ht 68.5 in | Wt 236.0 lb

## 2017-09-10 DIAGNOSIS — M359 Systemic involvement of connective tissue, unspecified: Secondary | ICD-10-CM

## 2017-09-10 DIAGNOSIS — M19041 Primary osteoarthritis, right hand: Secondary | ICD-10-CM

## 2017-09-10 DIAGNOSIS — R768 Other specified abnormal immunological findings in serum: Secondary | ICD-10-CM

## 2017-09-10 DIAGNOSIS — D8989 Other specified disorders involving the immune mechanism, not elsewhere classified: Secondary | ICD-10-CM

## 2017-09-10 DIAGNOSIS — M79672 Pain in left foot: Secondary | ICD-10-CM

## 2017-09-10 DIAGNOSIS — M79642 Pain in left hand: Secondary | ICD-10-CM | POA: Diagnosis not present

## 2017-09-10 DIAGNOSIS — Z8679 Personal history of other diseases of the circulatory system: Secondary | ICD-10-CM | POA: Diagnosis not present

## 2017-09-10 DIAGNOSIS — Z8639 Personal history of other endocrine, nutritional and metabolic disease: Secondary | ICD-10-CM

## 2017-09-10 DIAGNOSIS — M79641 Pain in right hand: Secondary | ICD-10-CM | POA: Diagnosis not present

## 2017-09-10 DIAGNOSIS — M19042 Primary osteoarthritis, left hand: Secondary | ICD-10-CM

## 2017-09-10 DIAGNOSIS — M79671 Pain in right foot: Secondary | ICD-10-CM

## 2017-09-10 DIAGNOSIS — Z79899 Other long term (current) drug therapy: Secondary | ICD-10-CM | POA: Diagnosis not present

## 2017-09-10 NOTE — Patient Instructions (Addendum)
Hydroxychloroquine tablets What is this medicine? HYDROXYCHLOROQUINE (hye drox ee KLOR oh kwin) is used to treat rheumatoid arthritis and systemic lupus erythematosus. It is also used to treat malaria. This medicine may be used for other purposes; ask your health care provider or pharmacist if you have questions. COMMON BRAND NAME(S): Plaquenil, Quineprox What should I tell my health care provider before I take this medicine? They need to know if you have any of these conditions: -diabetes -eye disease, vision problems -G6PD deficiency -history of blood diseases -history of irregular heartbeat -if you often drink alcohol -kidney disease -liver disease -porphyria -psoriasis -seizures -an unusual or allergic reaction to chloroquine, hydroxychloroquine, other medicines, foods, dyes, or preservatives -pregnant or trying to get pregnant -breast-feeding How should I use this medicine? Take this medicine by mouth with a glass of water. Follow the directions on the prescription label. Avoid taking antacids within 4 hours of taking this medicine. It is best to separate these medicines by at least 4 hours. Do not cut, crush or chew this medicine. You can take it with or without food. If it upsets your stomach, take it with food. Take your medicine at regular intervals. Do not take your medicine more often than directed. Take all of your medicine as directed even if you think you are better. Do not skip doses or stop your medicine early. Talk to your pediatrician regarding the use of this medicine in children. While this drug may be prescribed for selected conditions, precautions do apply. Overdosage: If you think you have taken too much of this medicine contact a poison control center or emergency room at once. NOTE: This medicine is only for you. Do not share this medicine with others. What if I miss a dose? If you miss a dose, take it as soon as you can. If it is almost time for your next dose,  take only that dose. Do not take double or extra doses. What may interact with this medicine? Do not take this medicine with any of the following medications: -cisapride -dofetilide -dronedarone -live virus vaccines -penicillamine -pimozide -thioridazine -ziprasidone This medicine may also interact with the following medications: -ampicillin -antacids -cimetidine -cyclosporine -digoxin -medicines for diabetes, like insulin, glipizide, glyburide -medicines for seizures like carbamazepine, phenobarbital, phenytoin -mefloquine -methotrexate -other medicines that prolong the QT interval (cause an abnormal heart rhythm) -praziquantel This list may not describe all possible interactions. Give your health care provider a list of all the medicines, herbs, non-prescription drugs, or dietary supplements you use. Also tell them if you smoke, drink alcohol, or use illegal drugs. Some items may interact with your medicine. What should I watch for while using this medicine? Tell your doctor or healthcare professional if your symptoms do not start to get better or if they get worse. Avoid taking antacids within 4 hours of taking this medicine. It is best to separate these medicines by at least 4 hours. Tell your doctor or health care professional right away if you have any change in your eyesight. Your vision and blood may be tested before and during use of this medicine. This medicine can make you more sensitive to the sun. Keep out of the sun. If you cannot avoid being in the sun, wear protective clothing and use sunscreen. Do not use sun lamps or tanning beds/booths. What side effects may I notice from receiving this medicine? Side effects that you should report to your doctor or health care professional as soon as possible: -allergic reactions like skin rash,   itching or hives, swelling of the face, lips, or tongue -changes in vision -decreased hearing or ringing of the ears -redness,  blistering, peeling or loosening of the skin, including inside the mouth -seizures -sensitivity to light -signs and symptoms of a dangerous change in heartbeat or heart rhythm like chest pain; dizziness; fast or irregular heartbeat; palpitations; feeling faint or lightheaded, falls; breathing problems -signs and symptoms of liver injury like dark yellow or brown urine; general ill feeling or flu-like symptoms; light-colored stools; loss of appetite; nausea; right upper belly pain; unusually weak or tired; yellowing of the eyes or skin -signs and symptoms of low blood sugar such as feeling anxious; confusion; dizziness; increased hunger; unusually weak or tired; sweating; shakiness; cold; irritable; headache; blurred vision; fast heartbeat; loss of consciousness -uncontrollable head, mouth, neck, arm, or leg movements Side effects that usually do not require medical attention (report to your doctor or health care professional if they continue or are bothersome): -anxious -diarrhea -dizziness -hair loss -headache -irritable -loss of appetite -nausea, vomiting -stomach pain This list may not describe all possible side effects. Call your doctor for medical advice about side effects. You may report side effects to FDA at 1-800-FDA-1088. Where should I keep my medicine? Keep out of the reach of children. In children, this medicine can cause overdose with small doses. Store at room temperature between 15 and 30 degrees C (59 and 86 degrees F). Protect from moisture and light. Throw away any unused medicine after the expiration date. NOTE: This sheet is a summary. It may not cover all possible information. If you have questions about this medicine, talk to your doctor, pharmacist, or health care provider.  2018 Elsevier/Gold Standard (2016-03-22 14:16:15)  Standing Labs We placed an order today for your standing lab work.    Please come back and get your standing labs in 1 month and then every 3  months  We have open lab Monday through Friday from 8:30-11:30 AM and 1:30-4 PM at the office of Dr. Bo Merino.   The office is located at [address], [place], [place], [place] [address] No appointment is necessary.   Labs are drawn by [facility].  You may receive a bill from [facility] for your lab work. If you have any questions regarding directions or hours of operation,  please call [phone].

## 2017-09-11 LAB — URINALYSIS, ROUTINE W REFLEX MICROSCOPIC
Bilirubin Urine: NEGATIVE
GLUCOSE, UA: NEGATIVE
Hgb urine dipstick: NEGATIVE
KETONES UR: NEGATIVE
Leukocytes, UA: NEGATIVE
NITRITE: NEGATIVE
Protein, ur: NEGATIVE
Specific Gravity, Urine: 1.024 (ref 1.001–1.03)

## 2017-09-13 DIAGNOSIS — Z0000 Encounter for general adult medical examination without abnormal findings: Secondary | ICD-10-CM | POA: Diagnosis not present

## 2017-09-13 DIAGNOSIS — Z79899 Other long term (current) drug therapy: Secondary | ICD-10-CM | POA: Diagnosis not present

## 2017-09-13 LAB — CBC WITH DIFFERENTIAL/PLATELET
BASOS ABS: 48 {cells}/uL (ref 0–200)
Basophils Relative: 0.4 %
EOS PCT: 1.7 %
Eosinophils Absolute: 202 cells/uL (ref 15–500)
HEMATOCRIT: 38.3 % (ref 35.0–45.0)
Hemoglobin: 12.9 g/dL (ref 11.7–15.5)
Lymphs Abs: 5355 cells/uL — ABNORMAL HIGH (ref 850–3900)
MCH: 28.7 pg (ref 27.0–33.0)
MCHC: 33.7 g/dL (ref 32.0–36.0)
MCV: 85.3 fL (ref 80.0–100.0)
MPV: 10.5 fL (ref 7.5–12.5)
Monocytes Relative: 6.1 %
NEUTROS PCT: 46.8 %
Neutro Abs: 5569 cells/uL (ref 1500–7800)
PLATELETS: 291 10*3/uL (ref 140–400)
RBC: 4.49 10*6/uL (ref 3.80–5.10)
RDW: 12.5 % (ref 11.0–15.0)
TOTAL LYMPHOCYTE: 45 %
WBC: 11.9 10*3/uL — AB (ref 3.8–10.8)
WBCMIX: 726 {cells}/uL (ref 200–950)

## 2017-09-13 LAB — COMPLETE METABOLIC PANEL WITH GFR
AG RATIO: 1.6 (calc) (ref 1.0–2.5)
ALKALINE PHOSPHATASE (APISO): 91 U/L (ref 33–130)
ALT: 15 U/L (ref 6–29)
AST: 17 U/L (ref 10–35)
Albumin: 4.3 g/dL (ref 3.6–5.1)
BILIRUBIN TOTAL: 0.3 mg/dL (ref 0.2–1.2)
BUN: 21 mg/dL (ref 7–25)
CALCIUM: 9.7 mg/dL (ref 8.6–10.4)
CHLORIDE: 102 mmol/L (ref 98–110)
CO2: 30 mmol/L (ref 20–32)
Creat: 0.88 mg/dL (ref 0.60–0.93)
GFR, Est African American: 77 mL/min/{1.73_m2} (ref >=60)
GFR, Est Non African American: 66 mL/min/{1.73_m2} (ref >=60)
GLOBULIN: 2.7 g/dL (ref 1.9–3.7)
Glucose, Bld: 95 mg/dL (ref 65–99)
POTASSIUM: 4.3 mmol/L (ref 3.5–5.3)
SODIUM: 141 mmol/L (ref 135–146)
Total Protein: 7 g/dL (ref 6.1–8.1)

## 2017-09-13 LAB — ANTI-SMITH ANTIBODY: ENA SM AB SER-ACNC: NEGATIVE AI

## 2017-09-13 LAB — ANTI-SCLERODERMA ANTIBODY: Scleroderma (Scl-70) (ENA) Antibody, IgG: <1 AI

## 2017-09-13 LAB — ANTI-NUCLEAR AB-TITER (ANA TITER)

## 2017-09-13 LAB — C3 AND C4
C3 Complement: 146 mg/dL (ref 83–193)
C4 COMPLEMENT: 29 mg/dL (ref 15–57)

## 2017-09-13 LAB — CARDIOLIPIN ANTIBODIES, IGG, IGM, IGA: Anticardiolipin IgM: <12 [MPL'U]

## 2017-09-13 LAB — SJOGRENS SYNDROME-B EXTRACTABLE NUCLEAR ANTIBODY: SSB (LA) (ENA) ANTIBODY, IGG: NEGATIVE AI

## 2017-09-13 LAB — SJOGRENS SYNDROME-A EXTRACTABLE NUCLEAR ANTIBODY: SSA (Ro) (ENA) Antibody, IgG: <1 AI

## 2017-09-13 LAB — RNP ANTIBODY: Ribonucleic Protein(ENA) Antibody, IgG: <1 AI

## 2017-09-13 LAB — BETA-2 GLYCOPROTEIN ANTIBODIES
Beta-2 Glyco 1 IgA: <9 SAU (ref <=20)
Beta-2 Glyco I IgG: <9 SGU (ref <=20)

## 2017-09-13 LAB — RHEUMATOID FACTOR

## 2017-09-13 LAB — ANTI-DNA ANTIBODY, DOUBLE-STRANDED: DS DNA AB: 6 [IU]/mL — AB

## 2017-09-13 LAB — CYCLIC CITRUL PEPTIDE ANTIBODY, IGG: Cyclic Citrullin Peptide Ab: <16 UNITS

## 2017-09-13 LAB — 14-3-3 ETA PROTEIN

## 2017-09-13 LAB — ANA: Anti Nuclear Antibody(ANA): POSITIVE — AB

## 2017-09-13 LAB — SEDIMENTATION RATE: SED RATE: 9 mm/h (ref 0–30)

## 2017-09-14 NOTE — Progress Notes (Signed)
Please advise patient to start Plaquenil.  The prescription was sent yesterday. Her labs (dsDNA and ANA +) and exam yesterday are consistent with lupus.

## 2017-09-17 ENCOUNTER — Telehealth: Payer: Self-pay | Admitting: *Deleted

## 2017-09-17 MED ORDER — HYDROXYCHLOROQUINE SULFATE 200 MG PO TABS: 200.0000 mg | ORAL_TABLET | Freq: Two times a day (BID) | ORAL | 2 refills | Status: DC

## 2017-09-17 NOTE — Telephone Encounter (Signed)
-----   Message from Ofilia Neas, PA-C sent at 09/14/2017  4:18 PM EST ----- Please advise patient to start Plaquenil.  The prescription was sent yesterday. Her labs (dsDNA and ANA +) and exam yesterday are consistent with lupus.

## 2017-09-18 ENCOUNTER — Telehealth: Payer: Self-pay | Admitting: Rheumatology

## 2017-09-18 NOTE — Telephone Encounter (Signed)
Patient left a voicemail to check if we received the results of her eye exam from Dr. Marvel Plan.  She is requesting a prescription refill.  Patient's CB# [phone]

## 2017-09-18 NOTE — Telephone Encounter (Signed)
Left message to advise patient we have not received PLQ eye exam at this time. Patient advise prescription has been refilled.

## 2017-09-26 DIAGNOSIS — H1131 Conjunctival hemorrhage, right eye: Secondary | ICD-10-CM | POA: Diagnosis not present

## 2017-10-16 ENCOUNTER — Telehealth: Payer: Self-pay | Admitting: Rheumatology

## 2017-10-16 NOTE — Telephone Encounter (Signed)
Left message to advise patient she may have her bone density scan while on PLQ.

## 2017-10-16 NOTE — Telephone Encounter (Signed)
Patient requesting a call back to let her know if she can have a scheduled bone density scan done while on Plaquenil. Please call to advise.

## 2017-10-18 ENCOUNTER — Other Ambulatory Visit: Payer: Self-pay

## 2017-10-18 ENCOUNTER — Telehealth: Payer: Self-pay | Admitting: Rheumatology

## 2017-10-18 DIAGNOSIS — Z79899 Other long term (current) drug therapy: Secondary | ICD-10-CM | POA: Diagnosis not present

## 2017-10-18 LAB — COMPLETE METABOLIC PANEL WITH GFR
AG Ratio: 1.8 (calc) (ref 1.0–2.5)
ALKALINE PHOSPHATASE (APISO): 86 U/L (ref 33–130)
ALT: 21 U/L (ref 6–29)
AST: 23 U/L (ref 10–35)
Albumin: 4.6 g/dL (ref 3.6–5.1)
BUN/Creatinine Ratio: 22 (calc) (ref 6–22)
BUN: 28 mg/dL — AB (ref 7–25)
CALCIUM: 9.3 mg/dL (ref 8.6–10.4)
CO2: 26 mmol/L (ref 20–32)
CREATININE: 1.25 mg/dL — AB (ref 0.60–0.93)
Chloride: 98 mmol/L (ref 98–110)
GFR, EST NON AFRICAN AMERICAN: 43 mL/min/{1.73_m2} — AB (ref >=60)
GFR, Est African American: 50 mL/min/{1.73_m2} — ABNORMAL LOW (ref >=60)
GLUCOSE: 101 mg/dL — AB (ref 65–99)
Globulin: 2.5 g/dL (calc) (ref 1.9–3.7)
Potassium: 3.4 mmol/L — ABNORMAL LOW (ref 3.5–5.3)
SODIUM: 134 mmol/L — AB (ref 135–146)
Total Bilirubin: 0.6 mg/dL (ref 0.2–1.2)
Total Protein: 7.1 g/dL (ref 6.1–8.1)

## 2017-10-18 LAB — CBC WITH DIFFERENTIAL/PLATELET
BASOS PCT: 0.3 %
Basophils Absolute: 34 cells/uL (ref 0–200)
EOS ABS: 45 {cells}/uL (ref 15–500)
Eosinophils Relative: 0.4 %
HCT: 39.2 % (ref 35.0–45.0)
HEMOGLOBIN: 13.8 g/dL (ref 11.7–15.5)
Lymphs Abs: 4491 cells/uL — ABNORMAL HIGH (ref 850–3900)
MCH: 29.1 pg (ref 27.0–33.0)
MCHC: 35.2 g/dL (ref 32.0–36.0)
MCV: 82.7 fL (ref 80.0–100.0)
MPV: 10.4 fL (ref 7.5–12.5)
Monocytes Relative: 6.3 %
NEUTROS ABS: 5925 {cells}/uL (ref 1500–7800)
Neutrophils Relative %: 52.9 %
PLATELETS: 292 10*3/uL (ref 140–400)
RBC: 4.74 10*6/uL (ref 3.80–5.10)
RDW: 12.6 % (ref 11.0–15.0)
TOTAL LYMPHOCYTE: 40.1 %
WBC: 11.2 10*3/uL — AB (ref 3.8–10.8)
WBCMIX: 706 {cells}/uL (ref 200–950)

## 2017-10-18 NOTE — Telephone Encounter (Signed)
Patient is on PLQ.  Any recommendations?

## 2017-10-18 NOTE — Telephone Encounter (Signed)
Patient states she has a lot of gas that is causing discomfort and she is not sure if it has to do with the medication that she takes.  Patient states that she uses [facility] in [place] if there is something that can be called in to help with the gas.

## 2017-10-18 NOTE — Telephone Encounter (Signed)
Please notify patient that Plaquenil can cause GI upset and diarrhea.  She should be taking Plaquenil with food daily to help eliminate these side effects.  She can try taking probiotics and Gas-x over the counter as well.  Please advise her to notify us if she develops worsening symptoms.

## 2017-10-19 NOTE — Telephone Encounter (Signed)
Patient advised that Plaquenil can cause GI upset and diarrhea.  She should be taking Plaquenil with food daily to help eliminate these side effects.  She can try taking probiotics and Gas-x over the counter as well.  Please advise her to notify us if she develops worsening symptoms. Patient verbalized understanding.

## 2017-10-19 NOTE — Progress Notes (Signed)
Creatinine is higher.  Patient is on diuretics.  Please send the results to her PCP.

## 2017-11-14 ENCOUNTER — Telehealth: Payer: Self-pay | Admitting: Rheumatology

## 2017-11-14 NOTE — Telephone Encounter (Signed)
I called patient and discussed that she may reduce the dose of Plaquenil to 1 tablet p.o. twice daily Monday through Friday.  I did explain that Plaquenil can cause increased bleeding due to blood thinning effect.

## 2017-11-14 NOTE — Telephone Encounter (Signed)
Patient advised she does not need to come for blood work before next appointment. Patient states after her last blood work she has a blood vessel in her in her eye burst. Patient states she saw her eye doctor and he had her hold the baby aspirin she is taking for a day. Patient states she had the same issue again this month. Patient wanted to make sure that this isn't a concern with the PLQ. Please advise.

## 2017-11-14 NOTE — Telephone Encounter (Signed)
Patient left a message stating she has a return office visit for 4/25, and wants to know if she will be due for repeat labs before scheduled appt. Please call to advise.

## 2017-11-29 NOTE — Progress Notes (Signed)
Office Visit Note  Patient: Jacqueline Casey             Date of Birth: Dec 02, 1945           MRN: [id number]             PCP: Jake Samples, PA-C Referring: Jake Samples, [place]* Visit Date: 12/13/2017 Occupation: @GUAROCC @    Subjective:  Hand and knee pain    History of Present Illness: Jacqueline Casey is a 72 y.o. female with history of autoimmune disease and osteoarthritis.  She has been taking Plaquenil 200 mg twice daily.  She has not noticed any benefit since starting Plaquenil.  She states she continues to have significant discomfort in bilateral wrists and bilateral hands.  She states that her hands and wrists have also been swollen and warm.  He states that she is also having pain in bilateral knees and is noticed some swelling.  She has been taking ibuprofen more frequently due to increased pain and swelling.  She denies any sores in her mother knows.  She denies any swollen lymph nodes.  She denies any rashes.  She denies any sicca symptoms or Raynauds.  She denies any palpitations or shortness of breath.   Activities of Daily Living:  Patient reports morning stiffness for 3-4  hours.   Patient Denies nocturnal pain.  Difficulty dressing/grooming: Denies Difficulty climbing stairs: Reports Difficulty getting out of chair: Reports Difficulty using hands for taps, buttons, cutlery, and/or writing: Reports   Review of Systems  Constitutional: Positive for fatigue.  HENT: Negative for mouth sores, mouth dryness and nose dryness.   Eyes: Negative for pain, visual disturbance and dryness.  Respiratory: Negative for cough, hemoptysis, shortness of breath and difficulty breathing.   Cardiovascular: Negative for chest pain, palpitations, hypertension and swelling in legs/feet.  Gastrointestinal: Negative for blood in stool, constipation and diarrhea.  Endocrine: Negative for increased urination.  Genitourinary: Negative for painful urination.  Musculoskeletal:  Positive for arthralgias, joint pain, joint swelling, morning stiffness and muscle tenderness. Negative for myalgias, muscle weakness and myalgias.  Skin: Positive for nodules/bumps. Negative for color change, pallor, rash, hair loss, skin tightness, ulcers and sensitivity to sunlight.  Allergic/Immunologic: Negative for susceptible to infections.  Neurological: Negative for dizziness, numbness, headaches and weakness.  Hematological: Negative for swollen glands.  Psychiatric/Behavioral: Negative for depressed mood and sleep disturbance. The patient is not nervous/anxious.     PMFS History:  Patient Active Problem List   Diagnosis Date Noted  . Baker's cyst of knee 10/18/2011  . Knee mass 10/04/2011    Past Medical History:  Diagnosis Date  . Baker's cyst of knee 10/18/2011  . Hypertension   . Thyroid disease     Family History  Problem Relation Age of Onset  . Cancer Unknown   . Diabetes Unknown   . Cancer Mother        breast  . COPD Mother   . Leukemia Father   . Osteoporosis Sister   . Asthma Sister   . Dermatomyositis Sister   . COPD Sister   . Kidney disease Sister   . Cancer Son        lymphoma, colon  . COPD Daughter   . Rheum arthritis Daughter    Past Surgical History:  Procedure Laterality Date  . FOOT SURGERY Right 2017   hammer toe   . HAND SURGERY Left 2014   finger, hand  . THYROID SURGERY    . TUBAL LIGATION    .  VAGINAL HYSTERECTOMY     Social History   Social History Narrative  . Not on file     Objective: Vital Signs: BP 124/73 (BP Location: Left Arm, Patient Position: Sitting, Cuff Size: Large)   Pulse 86   Resp 13   Ht 5\' 8"  (1.727 m)   Wt 238 lb (108 kg)   BMI 36.19 kg/m    Physical Exam  Constitutional: She is oriented to person, place, and time. She appears well-developed and well-nourished.  HENT:  Head: Normocephalic and atraumatic.  Eyes: Conjunctivae and EOM are normal.  Neck: Normal range of motion.  Cardiovascular:  Normal rate, regular rhythm, normal heart sounds and intact distal pulses.  Pulmonary/Chest: Effort normal and breath sounds normal.  Abdominal: Soft. Bowel sounds are normal.  Lymphadenopathy:    She has no cervical adenopathy.  Neurological: She is alert and oriented to person, place, and time.  Skin: Skin is warm and dry. Capillary refill takes less than 2 seconds.  Psychiatric: She has a normal mood and affect. Her behavior is normal.  Nursing note and vitals reviewed.    Musculoskeletal Exam: C-spine, thoracic spine, lumbar spine good range of motion.  No midline spinal tenderness.  No SI joint tenderness.  Shoulder joints, elbow joints range of motion.  She has limited extension of her right wrist.  She has extensor and flexor tenosynovitis of bilateral wrists.  MCPs PIPs and DIPs good range of motion.  She has synovitis of bilateral second MCP joints.  Hip joints, knee joints, ankle joints, MTPs, PIPs, DIPs good range of motion no synovitis.  She has mild warmth of bilateral knees.  She has bilateral knee crepitus.  Tenderness of trochanteric bursa bilaterally.  CDAI Exam: CDAI Homunculus Exam:   Tenderness:  RUE: wrist LUE: wrist  Swelling:  RUE: wrist LUE: wrist Right hand: 2nd MCP Left hand: 2nd MCP  Joint Counts:  CDAI Tender Joint count: 2 CDAI Swollen Joint count: 4     Investigation: No additional findings. CBC Latest Ref Rng & Units 10/18/2017 09/10/2017  WBC 3.8 - 10.8 Thousand/uL 11.2(H) 11.9(H)  Hemoglobin 11.7 - 15.5 g/dL 13.8 12.9  Hematocrit 35.0 - 45.0 % 39.2 38.3  Platelets 140 - 400 Thousand/uL 292 291   CMP Latest Ref Rng & Units 10/18/2017 09/10/2017  Glucose 65 - 99 mg/dL 101(H) 95  BUN 7 - 25 mg/dL 28(H) 21  Creatinine 0.60 - 0.93 mg/dL 1.25(H) 0.88  Sodium 135 - 146 mmol/L 134(L) 141  Potassium 3.5 - 5.3 mmol/L 3.4(L) 4.3  Chloride 98 - 110 mmol/L 98 102  CO2 20 - 32 mmol/L 26 30  Calcium 8.6 - 10.4 mg/dL 9.3 9.7  Total Protein 6.1 - 8.1 g/dL  7.1 7.0  Total Bilirubin 0.2 - 1.2 mg/dL 0.6 0.3  AST 10 - 35 U/L 23 17  ALT 6 - 29 U/L 21 15    Imaging: No results found.  Speciality Comments: PLQ eye exam: 09/13/2017 normal. Dr. Marvel Plan. Follow up in 6 months.    Procedures:  No procedures performed Allergies: Sulfa antibiotics   Assessment / Plan:     Visit Diagnoses: Autoimmune disease ([place]): ANA 1:80 Homogeneous, DsDNA 6: She has flexor and extensor tenosynovitis of bilateral wrists.  She continues to have joint pain in her bilateral hands, wrists, and knee joints. She has warmth and crepitus of bilateral knee joints.  She was started on Plaquenil at the end of January 2019.  She has not noticed any improvement in her symptoms  since restarting on Plaquenil 200 mg twice daily.  She does not have any oral or nasal ulcers on exam.  No cervical lymphadenopathy.  No malar rash noted.  She has no sicca symptoms or symptoms of Raynaud's.  We discussed adding methotrexate to her current treatment regimen and she would like to hold off at this time.  She is going to try using Voltaren gel to relieve her joint pain and joint swelling.  She has been taking ibuprofen but she would like she is a topical agent instead.  Prescription for Voltaren gel was sent to the pharmacy.  She is also given a list of natural anti-inflammatories that she can try.  High risk medication use - PLQ 200 mg BID. PLQ eye exam: 09/13/2017 normal. Dr. Marvel Plan. Follow up in 6 months.  CBC and CMP will be checked at her next visit.  Primary osteoarthritis of both hands: She has significant DIP synovial thickening consistent with zoster arthritis of bilateral hands.  Discussed.  She was given a list of natural anti-inflammatories that she can try.  Other medical conditions are listed as follows:  History of hypertension  History of hypothyroidism    Orders: No orders of the defined types were placed in this encounter.  Meds ordered this encounter  Medications    . diclofenac sodium (VOLTAREN) 1 % GEL    Sig: Apply 3 grams to 3 large joints up to 3 times daily.    Dispense:  3 Tube    Refill:  3    Face-to-face time spent with patient was 30 minutes. >50% of time was spent in counseling and coordination of care.  Follow-Up Instructions: Return for Autoimmune Disease, Osteoarthritis.   Jacqueline Neas, PA-C  Note - This record has been created using Dragon software.  Chart creation errors have been sought, but may not always  have been located. Such creation errors do not reflect on  the standard of medical care.

## 2017-12-13 ENCOUNTER — Ambulatory Visit (INDEPENDENT_AMBULATORY_CARE_PROVIDER_SITE_OTHER): Payer: Medicare Other | Admitting: Physician Assistant

## 2017-12-13 ENCOUNTER — Ambulatory Visit: Payer: Medicare Other | Admitting: Rheumatology

## 2017-12-13 ENCOUNTER — Encounter: Payer: Self-pay | Admitting: Physician Assistant

## 2017-12-13 VITALS — BP 124/73 | HR 86 | Resp 13 | Ht 68.0 in | Wt 238.0 lb

## 2017-12-13 DIAGNOSIS — D8989 Other specified disorders involving the immune mechanism, not elsewhere classified: Secondary | ICD-10-CM | POA: Diagnosis not present

## 2017-12-13 DIAGNOSIS — M359 Systemic involvement of connective tissue, unspecified: Secondary | ICD-10-CM

## 2017-12-13 DIAGNOSIS — M19042 Primary osteoarthritis, left hand: Secondary | ICD-10-CM

## 2017-12-13 DIAGNOSIS — M19041 Primary osteoarthritis, right hand: Secondary | ICD-10-CM | POA: Diagnosis not present

## 2017-12-13 DIAGNOSIS — Z8639 Personal history of other endocrine, nutritional and metabolic disease: Secondary | ICD-10-CM | POA: Diagnosis not present

## 2017-12-13 DIAGNOSIS — Z8679 Personal history of other diseases of the circulatory system: Secondary | ICD-10-CM | POA: Diagnosis not present

## 2017-12-13 DIAGNOSIS — Z79899 Other long term (current) drug therapy: Secondary | ICD-10-CM | POA: Diagnosis not present

## 2017-12-13 MED ORDER — DICLOFENAC SODIUM 1 % TD GEL: TRANSDERMAL | 3 refills | Status: DC

## 2017-12-13 NOTE — Patient Instructions (Signed)
Natural anti-inflammatories  You can purchase these at [employer], [employer] or online.  . Turmeric (capsules)  . Ginger (ginger root or capsules)  . Omega 3 (Fish, flax seeds, chia seeds, walnuts, almonds)  . Tart cherry (dried or extract)   Patient should be under the care of a physician while taking these supplements. This may not be reproduced without the permission of Dr. Shaili Deveshwar.  

## 2017-12-29 ENCOUNTER — Other Ambulatory Visit: Payer: Self-pay | Admitting: Rheumatology

## 2017-12-31 NOTE — Telephone Encounter (Signed)
Last Visit: 12/13/17 Next visit :05/15/18 Labs: 10/18/17 Creatinine is higher. Patient is on diuretics.  PLQ eye exam: 09/13/2017 normal.   Okay to refill per Dr. Estanislado Pandy

## 2018-01-31 DIAGNOSIS — Z7409 Other reduced mobility: Secondary | ICD-10-CM | POA: Diagnosis not present

## 2018-01-31 DIAGNOSIS — I10 Essential (primary) hypertension: Secondary | ICD-10-CM | POA: Diagnosis not present

## 2018-01-31 DIAGNOSIS — E6609 Other obesity due to excess calories: Secondary | ICD-10-CM | POA: Diagnosis not present

## 2018-01-31 DIAGNOSIS — M359 Systemic involvement of connective tissue, unspecified: Secondary | ICD-10-CM | POA: Diagnosis not present

## 2018-01-31 DIAGNOSIS — M069 Rheumatoid arthritis, unspecified: Secondary | ICD-10-CM | POA: Diagnosis not present

## 2018-01-31 DIAGNOSIS — E063 Autoimmune thyroiditis: Secondary | ICD-10-CM | POA: Diagnosis not present

## 2018-01-31 DIAGNOSIS — Z1389 Encounter for screening for other disorder: Secondary | ICD-10-CM | POA: Diagnosis not present

## 2018-01-31 DIAGNOSIS — Z6835 Body mass index (BMI) 35.0-35.9, adult: Secondary | ICD-10-CM | POA: Diagnosis not present

## 2018-01-31 DIAGNOSIS — Z0001 Encounter for general adult medical examination with abnormal findings: Secondary | ICD-10-CM | POA: Diagnosis not present

## 2018-02-04 ENCOUNTER — Other Ambulatory Visit (HOSPITAL_COMMUNITY): Payer: Self-pay | Admitting: Family Medicine

## 2018-02-04 DIAGNOSIS — E2839 Other primary ovarian failure: Secondary | ICD-10-CM

## 2018-02-07 ENCOUNTER — Ambulatory Visit (HOSPITAL_COMMUNITY)
Admission: RE | Admit: 2018-02-07 | Discharge: 2018-02-07 | Disposition: A | Discharge status: Home / Self Care | Payer: Medicare Other | Source: Ambulatory Visit | Attending: Family Medicine | Admitting: Family Medicine

## 2018-02-07 DIAGNOSIS — M8588 Other specified disorders of bone density and structure, other site: Secondary | ICD-10-CM | POA: Insufficient documentation

## 2018-02-07 DIAGNOSIS — Z780 Asymptomatic menopausal state: Secondary | ICD-10-CM | POA: Diagnosis not present

## 2018-02-07 DIAGNOSIS — E2839 Other primary ovarian failure: Secondary | ICD-10-CM | POA: Diagnosis not present

## 2018-02-07 DIAGNOSIS — M85852 Other specified disorders of bone density and structure, left thigh: Secondary | ICD-10-CM | POA: Insufficient documentation

## 2018-02-07 DIAGNOSIS — M8589 Other specified disorders of bone density and structure, multiple sites: Secondary | ICD-10-CM | POA: Diagnosis not present

## 2018-03-06 ENCOUNTER — Telehealth: Payer: Self-pay | Admitting: Rheumatology

## 2018-03-06 DIAGNOSIS — H52223 Regular astigmatism, bilateral: Secondary | ICD-10-CM | POA: Diagnosis not present

## 2018-03-06 DIAGNOSIS — Z79899 Other long term (current) drug therapy: Secondary | ICD-10-CM | POA: Diagnosis not present

## 2018-03-06 NOTE — Telephone Encounter (Signed)
Faxed PLQ Eye exam form.

## 2018-03-06 NOTE — Telephone Encounter (Signed)
Fax# [phone]. Please fax Plaquenil Eye exam form to [facility] for patient.

## 2018-03-19 ENCOUNTER — Other Ambulatory Visit: Payer: Self-pay

## 2018-03-19 DIAGNOSIS — Z79899 Other long term (current) drug therapy: Secondary | ICD-10-CM

## 2018-03-19 LAB — COMPLETE METABOLIC PANEL WITH GFR
AG RATIO: 2 (calc) (ref 1.0–2.5)
ALKALINE PHOSPHATASE (APISO): 81 U/L (ref 33–130)
ALT: 12 U/L (ref 6–29)
AST: 19 U/L (ref 10–35)
Albumin: 4.3 g/dL (ref 3.6–5.1)
BILIRUBIN TOTAL: 0.4 mg/dL (ref 0.2–1.2)
BUN/Creatinine Ratio: 18 (calc) (ref 6–22)
BUN: 19 mg/dL (ref 7–25)
CHLORIDE: 100 mmol/L (ref 98–110)
CO2: 30 mmol/L (ref 20–32)
Calcium: 9.4 mg/dL (ref 8.6–10.4)
Creat: 1.06 mg/dL — ABNORMAL HIGH (ref 0.60–0.93)
GFR, Est African American: 61 mL/min/{1.73_m2} (ref >=60)
GFR, Est Non African American: 53 mL/min/{1.73_m2} — ABNORMAL LOW (ref >=60)
GLUCOSE: 101 mg/dL — AB (ref 65–99)
Globulin: 2.1 g/dL (calc) (ref 1.9–3.7)
POTASSIUM: 4 mmol/L (ref 3.5–5.3)
Sodium: 139 mmol/L (ref 135–146)
Total Protein: 6.4 g/dL (ref 6.1–8.1)

## 2018-03-19 LAB — CBC WITH DIFFERENTIAL/PLATELET
BASOS PCT: 0.5 %
Basophils Absolute: 44 cells/uL (ref 0–200)
EOS PCT: 1.9 %
Eosinophils Absolute: 167 cells/uL (ref 15–500)
HCT: 36 % (ref 35.0–45.0)
HEMOGLOBIN: 12.1 g/dL (ref 11.7–15.5)
Lymphs Abs: 3599 cells/uL (ref 850–3900)
MCH: 28.9 pg (ref 27.0–33.0)
MCHC: 33.6 g/dL (ref 32.0–36.0)
MCV: 86.1 fL (ref 80.0–100.0)
MPV: 10.2 fL (ref 7.5–12.5)
Monocytes Relative: 6.8 %
NEUTROS ABS: 4391 {cells}/uL (ref 1500–7800)
Neutrophils Relative %: 49.9 %
Platelets: 244 10*3/uL (ref 140–400)
RBC: 4.18 10*6/uL (ref 3.80–5.10)
RDW: 12.6 % (ref 11.0–15.0)
Total Lymphocyte: 40.9 %
WBC mixed population: 598 cells/uL (ref 200–950)
WBC: 8.8 10*3/uL (ref 3.8–10.8)

## 2018-03-20 DIAGNOSIS — Z7409 Other reduced mobility: Secondary | ICD-10-CM | POA: Diagnosis not present

## 2018-03-20 DIAGNOSIS — E6609 Other obesity due to excess calories: Secondary | ICD-10-CM | POA: Diagnosis not present

## 2018-03-20 DIAGNOSIS — Z1389 Encounter for screening for other disorder: Secondary | ICD-10-CM | POA: Diagnosis not present

## 2018-03-20 DIAGNOSIS — R739 Hyperglycemia, unspecified: Secondary | ICD-10-CM | POA: Diagnosis not present

## 2018-03-20 DIAGNOSIS — R7309 Other abnormal glucose: Secondary | ICD-10-CM | POA: Diagnosis not present

## 2018-03-20 DIAGNOSIS — R946 Abnormal results of thyroid function studies: Secondary | ICD-10-CM | POA: Diagnosis not present

## 2018-03-20 DIAGNOSIS — Z6835 Body mass index (BMI) 35.0-35.9, adult: Secondary | ICD-10-CM | POA: Diagnosis not present

## 2018-04-18 DIAGNOSIS — L920 Granuloma annulare: Secondary | ICD-10-CM | POA: Diagnosis not present

## 2018-04-18 DIAGNOSIS — L918 Other hypertrophic disorders of the skin: Secondary | ICD-10-CM | POA: Diagnosis not present

## 2018-04-18 DIAGNOSIS — L821 Other seborrheic keratosis: Secondary | ICD-10-CM | POA: Diagnosis not present

## 2018-05-01 NOTE — Progress Notes (Signed)
Office Visit Note  Patient: Jacqueline Casey             Date of Birth: [birth year]           MRN: [id number]             PCP: Jake Samples, PA-C Referring: Jake Samples, [place]* Visit Date: 05/15/2018 Occupation: @GUAROCC @  Subjective:  Granuloma annulare   History of Present Illness: Jacqueline Casey is a 72 y.o. female with history of autoimmune disease and osteoarthritis.  She is on PLQ 200 mg 1 tablet BID.   She was started on PLQ in January 2019. She states that she feels she has not noticed any benefit since starting on PLQ.  She states she has been experiencing side effects of hair loss and has developed granuloma annulare, which she feels is due to PLQ.  She has been following up with dermatology and has been applying triamcinolone cream PRN to the nodules. She ran out of PLQ 3 days ago, and she does not think she wants to restart.  She states she continues to have intermittent bilateral hand and wrist pain and swelling.     Activities of Daily Living:  Patient reports morning stiffness for 0 minutes.   Patient Reports nocturnal pain.  Difficulty dressing/grooming: Denies Difficulty climbing stairs: Denies Difficulty getting out of chair: Reports Difficulty using hands for taps, buttons, cutlery, and/or writing: Denies  Review of Systems  Constitutional: Positive for fatigue.  HENT: Negative for mouth sores, mouth dryness and nose dryness.   Eyes: Negative for pain, visual disturbance and dryness.  Respiratory: Negative for cough, hemoptysis, shortness of breath and difficulty breathing.   Cardiovascular: Negative for chest pain, palpitations, hypertension and swelling in legs/feet.  Gastrointestinal: Negative for blood in stool, constipation and diarrhea.  Endocrine: Negative for increased urination.  Genitourinary: Negative for painful urination.  Musculoskeletal: Positive for joint swelling. Negative for arthralgias, joint pain, myalgias, muscle weakness,  morning stiffness, muscle tenderness and myalgias.  Skin: Positive for rash and hair loss. Negative for color change, pallor, nodules/bumps, skin tightness, ulcers and sensitivity to sunlight.  Allergic/Immunologic: Negative for susceptible to infections.  Neurological: Negative for dizziness, numbness, headaches and weakness.  Hematological: Negative for swollen glands.  Psychiatric/Behavioral: Negative for depressed mood and sleep disturbance. The patient is not nervous/anxious.     PMFS History:  Patient Active Problem List   Diagnosis Date Noted  . Baker's cyst of knee 10/18/2011  . Knee mass 10/04/2011    Past Medical History:  Diagnosis Date  . Baker's cyst of knee 10/18/2011  . Hypertension   . Thyroid disease     Family History  Problem Relation Age of Onset  . Cancer Unknown   . Diabetes Unknown   . Cancer Mother        breast  . COPD Mother   . Leukemia Father   . Osteoporosis Sister   . Asthma Sister   . Dermatomyositis Sister   . COPD Sister   . Kidney disease Sister   . Cancer Son        lymphoma, colon  . COPD Daughter   . Rheum arthritis Daughter    Past Surgical History:  Procedure Laterality Date  . FOOT SURGERY Right 2017   hammer toe   . HAND SURGERY Left 2014   finger, hand  . THYROID SURGERY    . TUBAL LIGATION    . VAGINAL HYSTERECTOMY     Social History  Social History Narrative  . Not on file    Objective: Vital Signs: BP 131/79 (BP Location: Left Arm, Patient Position: Sitting, Cuff Size: Small)   Pulse 85   Resp 12   Ht 5' 8.5" (1.74 m)   Wt 237 lb 9.6 oz (107.8 kg)   BMI 35.60 kg/m    Physical Exam  Constitutional: She is oriented to person, place, and time. She appears well-developed and well-nourished.  HENT:  Head: Normocephalic and atraumatic.  Eyes: Conjunctivae and EOM are normal.  Neck: Normal range of motion.  Cardiovascular: Normal rate, regular rhythm, normal heart sounds and intact distal pulses.    Pulmonary/Chest: Effort normal and breath sounds normal.  Abdominal: Soft. Bowel sounds are normal.  Lymphadenopathy:    She has no cervical adenopathy.  Neurological: She is alert and oriented to person, place, and time.  Skin: Skin is warm and dry. Capillary refill takes less than 2 seconds.  Psychiatric: She has a normal mood and affect. Her behavior is normal.  Nursing note and vitals reviewed.    Musculoskeletal Exam: C-spine, thoracic spine, lumbar spine good range of motion.  No midline spinal tenderness.  No SI joint tenderness.  Shoulder joints, elbow joints good range of motion with no discomfort or synovitis.  She has slightly limited extension of her right wrist joint.  She has mild extensor tenosynovitis of the right wrist.  MCPs, PIPs, DIPs good range of motion with no synovitis.  Hip joints, knee joints, ankle joints, MTPs, PIPs, DIPs good range of motion no synovitis.  She has warmth of the right knee joint on exam.  She has bilateral knee crepitus.  No tenderness of trochanter bursa bilaterally.  CDAI Exam: CDAI Score: Not documented Patient Global Assessment: Not documented; Provider Global Assessment: Not documented Swollen: 3 ; Tender: 0  Joint Exam      Right  Left  Wrist  Swollen      Knee  Swollen   Swollen      Investigation: No additional findings.  Imaging: No results found.  Recent Labs: Lab Results  Component Value Date   WBC 8.8 03/19/2018   HGB 12.1 03/19/2018   PLT 244 03/19/2018   NA 139 03/19/2018   K 4.0 03/19/2018   CL 100 03/19/2018   CO2 30 03/19/2018   GLUCOSE 101 (H) 03/19/2018   BUN 19 03/19/2018   CREATININE 1.06 (H) 03/19/2018   BILITOT 0.4 03/19/2018   AST 19 03/19/2018   ALT 12 03/19/2018   PROT 6.4 03/19/2018   CALCIUM 9.4 03/19/2018   GFRAA 61 03/19/2018    Speciality Comments: PLQ eye exam: 03/06/2018 normal. Dr. Marvel Plan. Follow up in 6 months.  Procedures:  No procedures performed Allergies: Sulfa antibiotics    Assessment / Plan:     Visit Diagnoses: Autoimmune disease ([place]) - ANA 1:80 Homogeneous, DsDNA 6: She continues to have right extensor tenosynovitis and bilateral knee joint warmth and inflammation.  She was started on PLQ in January, but she does not feel as though she has noticed any benefit since initiating it.  She was recently diagnosed with granuloma annulare, which she feels is due to PLQ use.  She has been applying triamcinolone cream topically.  She has also noticed worsening hair loss recently. She does not have any oral or nasal ulcerations.  She is no parotid swelling on exam.  She has not had any symptoms of Raynaud's recently.  She has no sicca symptoms at this time.  She has not  had any worsening fatigue or low-grade fevers recently.  No shortness of breath or palpitations.  She would like to discontinue Plaquenil at this time.  In the future we will try ordering a basic labs.  High risk medication use - PLQ 200 mg BID. PLQ eye exam: 09/13/2017 normal. Dr. Marvel Plan. CBC and CMP were drawn on 03/19/18.   Primary osteoarthritis of both hands: She has PIP and DIP synovial thickening.  Joint protection and muscle strengthening.    History of hypertension  History of hypothyroidism  Granuloma annulare: She has been applying triamcinolone cream topically. She will be following up with dermatology PRN.   Orders: No orders of the defined types were placed in this encounter.  No orders of the defined types were placed in this encounter.   Face-to-face time spent with patient was 30 minutes. Greater than 50% of time was spent in counseling and coordination of care.  Follow-Up Instructions: Return in about 6 months (around 11/13/2018) for Autoimmune Disease, Osteoarthritis.   Ofilia Neas, PA-C  Note - This record has been created using Dragon software.  Chart creation errors have been sought, but may not always  have been located. Such creation errors do not reflect on  the  standard of medical care.

## 2018-05-10 DIAGNOSIS — Z6836 Body mass index (BMI) 36.0-36.9, adult: Secondary | ICD-10-CM | POA: Diagnosis not present

## 2018-05-10 DIAGNOSIS — L920 Granuloma annulare: Secondary | ICD-10-CM | POA: Diagnosis not present

## 2018-05-10 DIAGNOSIS — E6609 Other obesity due to excess calories: Secondary | ICD-10-CM | POA: Diagnosis not present

## 2018-05-15 ENCOUNTER — Encounter: Payer: Self-pay | Admitting: Physician Assistant

## 2018-05-15 ENCOUNTER — Ambulatory Visit (INDEPENDENT_AMBULATORY_CARE_PROVIDER_SITE_OTHER): Payer: Medicare Other | Admitting: Physician Assistant

## 2018-05-15 VITALS — BP 131/79 | HR 85 | Resp 12 | Ht 68.5 in | Wt 237.6 lb

## 2018-05-15 DIAGNOSIS — Z8639 Personal history of other endocrine, nutritional and metabolic disease: Secondary | ICD-10-CM

## 2018-05-15 DIAGNOSIS — Z8679 Personal history of other diseases of the circulatory system: Secondary | ICD-10-CM

## 2018-05-15 DIAGNOSIS — D8989 Other specified disorders involving the immune mechanism, not elsewhere classified: Secondary | ICD-10-CM

## 2018-05-15 DIAGNOSIS — L920 Granuloma annulare: Secondary | ICD-10-CM

## 2018-05-15 DIAGNOSIS — M19042 Primary osteoarthritis, left hand: Secondary | ICD-10-CM | POA: Diagnosis not present

## 2018-05-15 DIAGNOSIS — M359 Systemic involvement of connective tissue, unspecified: Secondary | ICD-10-CM

## 2018-05-15 DIAGNOSIS — Z79899 Other long term (current) drug therapy: Secondary | ICD-10-CM | POA: Diagnosis not present

## 2018-05-15 DIAGNOSIS — M19041 Primary osteoarthritis, right hand: Secondary | ICD-10-CM

## 2018-06-12 DIAGNOSIS — Z79899 Other long term (current) drug therapy: Secondary | ICD-10-CM | POA: Diagnosis not present

## 2018-11-07 DIAGNOSIS — L820 Inflamed seborrheic keratosis: Secondary | ICD-10-CM | POA: Diagnosis not present

## 2018-11-07 DIAGNOSIS — C441192 Basal cell carcinoma of skin of left lower eyelid, including canthus: Secondary | ICD-10-CM | POA: Diagnosis not present

## 2018-11-07 DIAGNOSIS — L920 Granuloma annulare: Secondary | ICD-10-CM | POA: Diagnosis not present

## 2018-11-07 HISTORY — PX: SKIN TAG REMOVAL: SHX780

## 2018-11-14 ENCOUNTER — Ambulatory Visit (INDEPENDENT_AMBULATORY_CARE_PROVIDER_SITE_OTHER): Payer: Medicare Other | Admitting: Physician Assistant

## 2018-11-14 ENCOUNTER — Ambulatory Visit: Payer: Medicare Other | Admitting: Physician Assistant

## 2018-11-14 ENCOUNTER — Encounter: Payer: Self-pay | Admitting: Physician Assistant

## 2018-11-14 ENCOUNTER — Other Ambulatory Visit: Payer: Self-pay

## 2018-11-14 VITALS — BP 155/81 | HR 76 | Resp 15 | Ht 68.5 in | Wt 243.0 lb

## 2018-11-14 DIAGNOSIS — Z8679 Personal history of other diseases of the circulatory system: Secondary | ICD-10-CM

## 2018-11-14 DIAGNOSIS — M19042 Primary osteoarthritis, left hand: Secondary | ICD-10-CM | POA: Diagnosis not present

## 2018-11-14 DIAGNOSIS — L920 Granuloma annulare: Secondary | ICD-10-CM

## 2018-11-14 DIAGNOSIS — Z8639 Personal history of other endocrine, nutritional and metabolic disease: Secondary | ICD-10-CM | POA: Diagnosis not present

## 2018-11-14 DIAGNOSIS — M19041 Primary osteoarthritis, right hand: Secondary | ICD-10-CM | POA: Diagnosis not present

## 2018-11-14 DIAGNOSIS — D899 Disorder involving the immune mechanism, unspecified: Secondary | ICD-10-CM

## 2018-11-14 DIAGNOSIS — Z79899 Other long term (current) drug therapy: Secondary | ICD-10-CM | POA: Diagnosis not present

## 2018-11-14 DIAGNOSIS — M359 Systemic involvement of connective tissue, unspecified: Secondary | ICD-10-CM | POA: Diagnosis not present

## 2018-11-14 NOTE — Progress Notes (Signed)
Office Visit Note  Patient: Jacqueline Casey             Date of Birth: October 06, 1945           MRN: [id number]             PCP: Jake Samples, PA-C Referring: Jake Samples, [place]* Visit Date: 11/14/2018 Occupation: @GUAROCC @  Subjective:  Left 2nd PIP joint pain   History of Present Illness: Jacqueline Casey is a 73 y.o. female with history of autoimmune disease and osteoarthritis. She discontinued PLQ about 5 months ago due to worsening hair loss.  She reports she did not notice any benefit while on PLQ.  She states she continues to have intermittent pain and swelling in both hands and both feet. She takes ibuprofen for pain relief. She states her knee joints are doing well and she has no discomfort at this time.   She denies any sores in mouth or nose.  She denies any symptoms of Raynaud's.  She reports she has a rash on the extensor surface of both elbow joints and was diagnosed with granuloma annulare.   She denies any photosensitivity or facial rashes.  She reports her hair has started to grown back.  She denies any recent fevers, worsening fatigue, or swollen nodes.  She denies any sicca symptoms.     Activities of Daily Living:  Patient reports morning stiffness for 0 minutes.   Patient Denies nocturnal pain.  Difficulty dressing/grooming: Denies Difficulty climbing stairs: Denies Difficulty getting out of chair: Reports Difficulty using hands for taps, buttons, cutlery, and/or writing: Denies  Review of Systems  Constitutional: Negative for fatigue.  HENT: Negative for mouth sores, mouth dryness and nose dryness.   Eyes: Negative for pain, visual disturbance and dryness.  Respiratory: Negative for cough, hemoptysis, shortness of breath and difficulty breathing.   Cardiovascular: Negative for chest pain, palpitations, hypertension and swelling in legs/feet.  Gastrointestinal: Negative for blood in stool, constipation and diarrhea.  Endocrine: Negative for  increased urination.  Genitourinary: Negative for painful urination.  Musculoskeletal: Positive for arthralgias, joint pain and joint swelling. Negative for myalgias, muscle weakness, morning stiffness, muscle tenderness and myalgias.  Skin: Positive for rash. Negative for color change, pallor, hair loss, nodules/bumps, skin tightness, ulcers and sensitivity to sunlight.  Allergic/Immunologic: Negative for susceptible to infections.  Neurological: Negative for dizziness, numbness, headaches and weakness.  Hematological: Negative for swollen glands.  Psychiatric/Behavioral: Negative for depressed mood and sleep disturbance. The patient is not nervous/anxious.     PMFS History:  Patient Active Problem List   Diagnosis Date Noted  . Baker's cyst of knee 10/18/2011  . Knee mass 10/04/2011    Past Medical History:  Diagnosis Date  . Baker's cyst of knee 10/18/2011  . Hypertension   . Thyroid disease     Family History  Problem Relation Age of Onset  . Cancer Other   . Diabetes Other   . Cancer Mother        breast  . COPD Mother   . Leukemia Father   . Osteoporosis Sister   . Asthma Sister   . Dermatomyositis Sister   . COPD Sister   . Kidney disease Sister   . Cancer Son        lymphoma, colon  . COPD Daughter   . Rheum arthritis Daughter    Past Surgical History:  Procedure Laterality Date  . FOOT SURGERY Right 2017   hammer toe   . HAND  SURGERY Left 2014   finger, hand  . SKIN TAG REMOVAL  11/07/2018   stomach and below left eye   . THYROID SURGERY    . TUBAL LIGATION    . VAGINAL HYSTERECTOMY     Social History   Social History Narrative  . Not on file    There is no immunization history on file for this patient.   Objective: Vital Signs: BP (!) 155/81 (BP Location: Left Arm, Patient Position: Sitting, Cuff Size: Normal)   Pulse 76   Resp 15   Ht 5' 8.5" (1.74 m)   Wt 243 lb (110.2 kg)   BMI 36.41 kg/m    Physical Exam Vitals signs and nursing note  reviewed.  Constitutional:      Appearance: She is well-developed.  HENT:     Head: Normocephalic and atraumatic.  Eyes:     Conjunctiva/sclera: Conjunctivae normal.  Neck:     Musculoskeletal: Normal range of motion.  Cardiovascular:     Rate and Rhythm: Normal rate and regular rhythm.     Heart sounds: Normal heart sounds.  Pulmonary:     Effort: Pulmonary effort is normal.     Breath sounds: Normal breath sounds.  Abdominal:     General: Bowel sounds are normal.     Palpations: Abdomen is soft.  Lymphadenopathy:     Cervical: No cervical adenopathy.  Skin:    General: Skin is warm and dry.     Capillary Refill: Capillary refill takes less than 2 seconds.  Neurological:     Mental Status: She is alert and oriented to person, place, and time.  Psychiatric:        Behavior: Behavior normal.      Musculoskeletal Exam: C-spine, thoracic spine, and lumbar spine good ROM.  No midline spinal tenderness.  No SI joint tenderness.  Shoulder joints, elbow joints, wrist joints, MCPs, PIPs, and DIPs good ROM.  Left 2nd PIP joint synovitis and tenderness.  Complete fist formation bilaterally.  Hip joints, knee joints, ankle joints, MTPs, PIPs, and DIPs good ROM with no synovitis.  No warmth or effusion of knee joints.  No tenderness or swelling of ankle joints.  No tenderness over trochanteric bursa bilaterally.   CDAI Exam: CDAI Score: Not documented Patient Global Assessment: Not documented; Provider Global Assessment: Not documented Swollen: 1 ; Tender: 1  Joint Exam      Right  Left  PIP 2     Swollen Tender     Investigation: No additional findings.  Imaging: No results found.  Recent Labs: Lab Results  Component Value Date   WBC 8.8 03/19/2018   HGB 12.1 03/19/2018   PLT 244 03/19/2018   NA 139 03/19/2018   K 4.0 03/19/2018   CL 100 03/19/2018   CO2 30 03/19/2018   GLUCOSE 101 (H) 03/19/2018   BUN 19 03/19/2018   CREATININE 1.06 (H) 03/19/2018   BILITOT 0.4  03/19/2018   AST 19 03/19/2018   ALT 12 03/19/2018   PROT 6.4 03/19/2018   CALCIUM 9.4 03/19/2018   GFRAA 61 03/19/2018    Speciality Comments: PLQ eye exam: 03/06/2018 normal. Dr. Marvel Plan. Follow up in 6 months.  Procedures:  No procedures performed Allergies: Sulfa antibiotics   Assessment / Plan:     Visit Diagnoses: Autoimmune disease ([place]) - ANA 1:80 Homogeneous, DsDNA 6: She has synovitis and tenderness of the left 2nd PIP joint. She has intermittent pain and swelling in both hands and both feet.  She  takes ibuprofen PRN and uses voltaren gel topically PRN for pain relief.  She discontinued PLQ 5 months ag due to inadequate response and SE of hair loss.  No malar rash noted.  No photosensitivity.  She has not had any symptoms of Raynaud's and no digital ulcerations or signs of gangrene noted.  She has no sicca symptoms.  She has no parotid swelling or tenderness.  She has not had any worsening fatigue, recent fevers, and no cervical lymphadenopathy.  She has not had any shortness of breath or palpitations.  Lungs clear to auscultation.  She does not want to restart on PLQ at this time.  We will check autoimmune lab work today.  She was advised to notify us if she develops any new or worsening symptoms.  She will follow up in 6 months.   - Plan: CBC with Differential/Platelet, COMPLETE METABOLIC PANEL WITH GFR, Urinalysis, Routine w reflex microscopic, ANA, C3 and C4, Anti-DNA antibody, double-stranded, Sedimentation rate  High risk medication use - D/c PLQ-inadequate response and SE of hair loss. PLQ eye exam: 03/06/2018- Plan: CBC with Differential/Platelet, COMPLETE METABOLIC PANEL WITH GFR  Primary osteoarthritis of both hands: She has PIP and DIP synovial thickening consistent with osteoarthritis.  Left 2nd PIP joint tenderness and synovitis noted.  She has complete fist formation bilaterally.  Joint protection and muscle strengthening were discussed.   Granuloma annulare: She is  followed by her dermatologist.    Other medical conditions are listed as follows;   History of hypothyroidism  History of hypertension   Orders: Orders Placed This Encounter  Procedures  . CBC with Differential/Platelet  . COMPLETE METABOLIC PANEL WITH GFR  . Urinalysis, Routine w reflex microscopic  . ANA  . C3 and C4  . Anti-DNA antibody, double-stranded  . Sedimentation rate   No orders of the defined types were placed in this encounter.    Follow-Up Instructions: Return in about 6 months (around 05/17/2019) for Autoimmune Disease, Osteoarthritis.   Ofilia Neas, PA-C  Note - This record has been created using Dragon software.  Chart creation errors have been sought, but may not always  have been located. Such creation errors do not reflect on  the standard of medical care.

## 2018-11-15 NOTE — Progress Notes (Signed)
DsDNA is 7 which is stable. Please advise patient to notify us if she develops any signs or symptoms of a flare.

## 2018-11-15 NOTE — Progress Notes (Signed)
CBC stable. CMP WNL. UA normal. Sed rate WNL. Complements WNL.

## 2018-11-19 LAB — CBC WITH DIFFERENTIAL/PLATELET
ABSOLUTE MONOCYTES: 518 {cells}/uL (ref 200–950)
Basophils Absolute: 43 cells/uL (ref 0–200)
Basophils Relative: 0.4 %
EOS ABS: 216 {cells}/uL (ref 15–500)
Eosinophils Relative: 2 %
HCT: 38.1 % (ref 35.0–45.0)
Hemoglobin: 12.8 g/dL (ref 11.7–15.5)
Lymphs Abs: 5270 cells/uL — ABNORMAL HIGH (ref 850–3900)
MCH: 29 pg (ref 27.0–33.0)
MCHC: 33.6 g/dL (ref 32.0–36.0)
MCV: 86.4 fL (ref 80.0–100.0)
MPV: 10 fL (ref 7.5–12.5)
Monocytes Relative: 4.8 %
Neutro Abs: 4752 cells/uL (ref 1500–7800)
Neutrophils Relative %: 44 %
PLATELETS: 271 10*3/uL (ref 140–400)
RBC: 4.41 10*6/uL (ref 3.80–5.10)
RDW: 12.6 % (ref 11.0–15.0)
TOTAL LYMPHOCYTE: 48.8 %
WBC: 10.8 10*3/uL (ref 3.8–10.8)

## 2018-11-19 LAB — COMPLETE METABOLIC PANEL WITH GFR
AG RATIO: 1.8 (calc) (ref 1.0–2.5)
ALT: 12 U/L (ref 6–29)
AST: 17 U/L (ref 10–35)
Albumin: 4.4 g/dL (ref 3.6–5.1)
Alkaline phosphatase (APISO): 85 U/L (ref 37–153)
BILIRUBIN TOTAL: 0.5 mg/dL (ref 0.2–1.2)
BUN: 17 mg/dL (ref 7–25)
CHLORIDE: 101 mmol/L (ref 98–110)
CO2: 32 mmol/L (ref 20–32)
Calcium: 9.8 mg/dL (ref 8.6–10.4)
Creat: 0.87 mg/dL (ref 0.60–0.93)
GFR, Est African American: 77 mL/min/{1.73_m2} (ref >=60)
GFR, Est Non African American: 67 mL/min/{1.73_m2} (ref >=60)
Globulin: 2.5 g/dL (calc) (ref 1.9–3.7)
Glucose, Bld: 92 mg/dL (ref 65–99)
POTASSIUM: 3.9 mmol/L (ref 3.5–5.3)
Sodium: 140 mmol/L (ref 135–146)
Total Protein: 6.9 g/dL (ref 6.1–8.1)

## 2018-11-19 LAB — URINALYSIS, ROUTINE W REFLEX MICROSCOPIC
Bilirubin Urine: NEGATIVE
GLUCOSE, UA: NEGATIVE
Hgb urine dipstick: NEGATIVE
Ketones, ur: NEGATIVE
LEUKOCYTE UA: NEGATIVE
NITRITE: NEGATIVE
Protein, ur: NEGATIVE
Specific Gravity, Urine: 1.014 (ref 1.001–1.03)
pH: 5.5 (ref 5.0–8.0)

## 2018-11-19 LAB — ANTI-NUCLEAR AB-TITER (ANA TITER): ANA Titer 1: 1:160 {titer} — ABNORMAL HIGH

## 2018-11-19 LAB — C3 AND C4
C3 COMPLEMENT: 157 mg/dL (ref 83–193)
C4 Complement: 36 mg/dL (ref 15–57)

## 2018-11-19 LAB — ANA: ANA: POSITIVE — AB

## 2018-11-19 LAB — ANTI-DNA ANTIBODY, DOUBLE-STRANDED: ds DNA Ab: 7 IU/mL — ABNORMAL HIGH

## 2018-11-19 LAB — SEDIMENTATION RATE: Sed Rate: 22 mm/h (ref 0–30)

## 2018-11-19 NOTE — Progress Notes (Signed)
ANA titer is 1:160 NH which is a low, non-specific titer.

## 2019-03-20 DIAGNOSIS — Z1389 Encounter for screening for other disorder: Secondary | ICD-10-CM | POA: Diagnosis not present

## 2019-03-20 DIAGNOSIS — E063 Autoimmune thyroiditis: Secondary | ICD-10-CM | POA: Diagnosis not present

## 2019-03-20 DIAGNOSIS — Z0001 Encounter for general adult medical examination with abnormal findings: Secondary | ICD-10-CM | POA: Diagnosis not present

## 2019-03-20 DIAGNOSIS — I10 Essential (primary) hypertension: Secondary | ICD-10-CM | POA: Diagnosis not present

## 2019-03-20 DIAGNOSIS — E6609 Other obesity due to excess calories: Secondary | ICD-10-CM | POA: Diagnosis not present

## 2019-03-20 DIAGNOSIS — M069 Rheumatoid arthritis, unspecified: Secondary | ICD-10-CM | POA: Diagnosis not present

## 2019-03-20 DIAGNOSIS — Z6836 Body mass index (BMI) 36.0-36.9, adult: Secondary | ICD-10-CM | POA: Diagnosis not present

## 2019-03-27 DIAGNOSIS — Z961 Presence of intraocular lens: Secondary | ICD-10-CM | POA: Diagnosis not present

## 2019-03-27 DIAGNOSIS — H43313 Vitreous membranes and strands, bilateral: Secondary | ICD-10-CM | POA: Diagnosis not present

## 2019-05-07 NOTE — Progress Notes (Signed)
Office Visit Note  Patient: Jacqueline Casey             Date of Birth: Oct 11, 1945           MRN: [id number]             PCP: Jacqueline Samples, PA-C Referring: Jacqueline Casey, [place]* Visit Date: 05/16/2019 Occupation: @GUAROCC @  Subjective:  Pain in both hands   History of Present Illness: Jacqueline Casey is a 73 y.o. female with history of autoimmune disease and osteoarthritis.  She continues to have chronic pain in both hands and both knee joints.  She has occasional pain in both feet.  She experiences swelling in both wrist joints as well as her knees intermittently.  She has a Baker's cyst the right knee which causes discomfort at times.  She has been to physical therapy for her knees in the past which has provided some relief.  She uses voltaren gel topically as needed for pain relief.  She has been using a walker to help with ambulation if she is going prolonged distances.  She takes ibuprofen as needed for pain relief.  She has also tries to using arthritis gloves and hand cream for her hand pain which seems to help. She reports that she was diagnosed with granuloma annulare and is followed by her dermatologist.  She uses triamcinolone cream topically as needed.  She denies any other rashes at this time.  She continues to have mouth dryness but denies any eye dryness.  She has not had any sores in her mouth or nose.  She denies any symptoms of Raynaud's.  She denies any fevers or swollen lymph nodes.  She is been experiencing some worsening fatigue over the past 2 weeks since her thyroid medication was changed.  She denies any other concerns at this time.   Activities of Daily Living:  Patient reports joint stiffness all day  Patient Reports nocturnal pain.  Difficulty dressing/grooming: Denies Difficulty climbing stairs: Reports Difficulty getting out of chair: Reports Difficulty using hands for taps, buttons, cutlery, and/or writing: Reports  Review of Systems    Constitutional: Positive for fatigue.  HENT: Positive for mouth dryness. Negative for mouth sores and nose dryness.   Eyes: Negative for pain, visual disturbance and dryness.  Respiratory: Negative for cough, hemoptysis, shortness of breath and difficulty breathing.   Cardiovascular: Negative for chest pain, palpitations, hypertension and swelling in legs/feet.  Gastrointestinal: Negative for blood in stool, constipation and diarrhea.  Endocrine: Negative for increased urination.  Genitourinary: Negative for painful urination.  Musculoskeletal: Positive for arthralgias, joint pain and morning stiffness. Negative for joint swelling, myalgias, muscle weakness, muscle tenderness and myalgias.  Skin: Negative for color change, pallor, rash, hair loss, nodules/bumps, skin tightness, ulcers and sensitivity to sunlight.  Allergic/Immunologic: Negative for susceptible to infections.  Neurological: Negative for dizziness, numbness, headaches and weakness.  Hematological: Negative for swollen glands.  Psychiatric/Behavioral: Negative for depressed mood and sleep disturbance. The patient is not nervous/anxious.     PMFS History:  Patient Active Problem List   Diagnosis Date Noted   Baker's cyst of knee 10/18/2011   Knee mass 10/04/2011    Past Medical History:  Diagnosis Date   Baker's cyst of knee 10/18/2011   Hypertension    Thyroid disease     Family History  Problem Relation Age of Onset   Cancer Other    Diabetes Other    Cancer Mother  breast   COPD Mother    Leukemia Father    Osteoporosis Sister    Asthma Sister    Dermatomyositis Sister    COPD Sister    Kidney disease Sister    Cancer Son        lymphoma, colon   COPD Daughter    Rheum arthritis Daughter    Past Surgical History:  Procedure Laterality Date   FOOT SURGERY Right 2017   hammer toe    HAND SURGERY Left 2014   finger, hand   SKIN TAG REMOVAL  11/07/2018   stomach and below  left eye    THYROID SURGERY     TUBAL LIGATION     VAGINAL HYSTERECTOMY     Social History   Social History Narrative   Not on file    There is no immunization history on file for this patient.   Objective: Vital Signs: BP (!) 151/78 (BP Location: Left Wrist, Patient Position: Sitting, Cuff Size: Normal)    Pulse 72    Resp 14    Ht 5' 8.5" (1.74 m)    Wt 245 lb (111.1 kg)    BMI 36.71 kg/m    Physical Exam Vitals signs and nursing note reviewed.  Constitutional:      Appearance: She is well-developed.  HENT:     Head: Normocephalic and atraumatic.  Eyes:     Conjunctiva/sclera: Conjunctivae normal.  Neck:     Musculoskeletal: Normal range of motion.  Cardiovascular:     Rate and Rhythm: Normal rate and regular rhythm.     Heart sounds: Normal heart sounds.  Pulmonary:     Effort: Pulmonary effort is normal.     Breath sounds: Normal breath sounds.  Abdominal:     General: Bowel sounds are normal.     Palpations: Abdomen is soft.  Lymphadenopathy:     Cervical: No cervical adenopathy.  Skin:    General: Skin is warm and dry.     Capillary Refill: Capillary refill takes less than 2 seconds.  Neurological:     Mental Status: She is alert and oriented to person, place, and time.  Psychiatric:        Behavior: Behavior normal.      Musculoskeletal Exam: C-spine good range of motion.  Thoracic kyphosis noted.  No midline spinal tenderness.  No SI joint tenderness.  Shoulder joints and elbow joints have good range of motion with no tenderness or inflammation.  She has limited range of motion of the right wrist.  She has tenderness and inflammation on the dorsal aspect of the right wrist.  MCPs, PIPs and DIPs have good range of motion.  She has complete fist formation bilaterally.  She has synovitis of the left second MCP and PIP joints.  Hip joints have good range of motion with no discomfort.  No tenderness over trochanteric bursa.  She has limited extension and flexion  of the right knee joint.  Baker's cyst palpable behind the right knee.  She has warmth of the right knee but no effusion noted.  Left knee has good range of motion with no warmth or effusion.  No tenderness or inflammation of ankle joints.  CDAI Exam: CDAI Score: -- Patient Global: --; Provider Global: -- Swollen: 3 ; Tender: 3  Joint Exam      Right  Left  Wrist  Swollen Tender     MCP 2     Swollen Tender  PIP 2     Swollen  Tender     Investigation: No additional findings.  Imaging: No results found.  Recent Labs: Lab Results  Component Value Date   WBC 10.8 11/14/2018   HGB 12.8 11/14/2018   PLT 271 11/14/2018   NA 140 11/14/2018   K 3.9 11/14/2018   CL 101 11/14/2018   CO2 32 11/14/2018   GLUCOSE 92 11/14/2018   BUN 17 11/14/2018   CREATININE 0.87 11/14/2018   BILITOT 0.5 11/14/2018   AST 17 11/14/2018   ALT 12 11/14/2018   PROT 6.9 11/14/2018   CALCIUM 9.8 11/14/2018   GFRAA 77 11/14/2018    Speciality Comments: PLQ eye exam: 03/06/2018 normal. Dr. Marvel Plan. Follow up in 6 months.  Procedures:  No procedures performed Allergies: Sulfa antibiotics   Assessment / Plan:     Visit Diagnoses: Autoimmune disease ([place]) - ANA 1:160 NH, dsDNA 7, C3/C4 WNL, sed rate WNL: She has not had any signs or symptoms of a flare recently.  She is not on any immunosuppressive agents at this time.  She was previously taking Plaquenil but discontinued due to an inadequate response and experiencing hair loss.  We reviewed lab work from 11/14/2018.  All questions were addressed today.  We will recheck autoimmune lab work today.  She does not want to be on any immunosuppressive agents at this time.  We discussed the risk of ongoing joint inflammation. She continues to have persistent synovitis as described above.  She has chronic pain in both hands and both knee joints.  She has been using Voltaren gel topically as needed any taking ibuprofen as needed for pain relief.  She has been a  handout of hand and knee exercises.  She declined physical therapy at this time.  No Maller rash was noted.  She has not had any symptoms of Raynaud's and no digital ulcerations or signs of gangrene were noted.  She has chronic mouth dryness but is not any eye dryness.  No oral nasal ulcerations.  No recent hair loss or photosensitivity.  She has chronic fatigue related to hypothyroidism.  She has not had any recent fevers or swollen lymph nodes.  She denies any shortness of breath, palpitations, or chest pain.  She was advised to notify us if she develops any new or worsening symptoms.  We will continue to monitor her autoimmune lab work every 6 months.  She will follow-up in the office in 6 months.- Plan: COMPLETE METABOLIC PANEL WITH GFR, CBC with Differential/Platelet, Urinalysis, Routine w reflex microscopic, C3 and C4, Anti-DNA antibody, double-stranded, Sedimentation rate  High risk medication use - D/c PLQ-inadequate response and SE of hair loss. PLQ eye exam: 03/06/2018.  She takes ibuprofen as needed for pain relief.  We will check a CBC and CMP today- Plan: COMPLETE METABOLIC PANEL WITH GFR, CBC with Differential/Platelet  Primary osteoarthritis of both hands: She has PIP and DIP synovial thickening consistent with osteoarthritis of bilateral hands which is chronic pain in both hands.  She experiences intermittent joint swelling.  She previously took Plaquenil for short period of time but discontinued due to not being able to tolerate the medication.  She does not want to start any other medications at this time.  Joint protection muscle strengthening were discussed.  She has been using arthritis gloves as well as hemp cream as needed for pain relief.  She also uses Voltaren gel topically as needed.  She was given a handout of hand exercises to perform.  Granuloma annulare: She is followed by dermatology.  She applies triamcinolone cream topically as needed.  History of hypothyroidism -She  requested a TSH level to be checked today.  Plan: TSH  History of hypertension: BP was elevated today-151/78.  Chronic pain of both knees: She has chronic pain in both knee joints.  She has limited flexion and extension of the right knee on exam.  Baker's cyst palpable behind the right knee.  She has warmth of the right knee but no effusion was noted.  Left knee has good range of motion with no warmth or effusion.  She declined x-rays of both knee joints today.  She declined cortisone injection today.  She declined physical therapy at this time but was advised to notify us if she would like [place] to put a referral in the future.  She was given a handout of knee exercises to perform.  We discussed the importance of weight loss and exercise.  She was encouraged to start walking on a daily basis.  She can continue using Voltaren gel topically as needed for pain relief.  She also takes ibuprofen as needed.  We will continue to monitor CBC and CMP.  She is advised to notify us if she develops new or worsening symptoms.  Synovial cyst of right popliteal space: She has a Baker's cyst palpable in the popliteal space of the right knee.  She is to follow-up with Dr. Aline Brochure on a regular basis.  She states in the past she has tried to have it drained but no fluid was aspirated at that time.  Orders: Orders Placed This Encounter  Procedures   COMPLETE METABOLIC PANEL WITH GFR   CBC with Differential/Platelet   Urinalysis, Routine w reflex microscopic   C3 and C4   Anti-DNA antibody, double-stranded   Sedimentation rate   TSH   No orders of the defined types were placed in this encounter.   Face-to-face time spent with patient was 30 minutes. Greater than 50% of time was spent in counseling and coordination of care.  Follow-Up Instructions: Return in about 6 months (around 11/13/2019) for Autoimmune Disease, Osteoarthritis.   Jacqueline Neas, PA-C  Note - This record has been created using Dragon  software.  Chart creation errors have been sought, but may not always  have been located. Such creation errors do not reflect on  the standard of medical care.

## 2019-05-16 ENCOUNTER — Encounter: Payer: Self-pay | Admitting: Physician Assistant

## 2019-05-16 ENCOUNTER — Ambulatory Visit (INDEPENDENT_AMBULATORY_CARE_PROVIDER_SITE_OTHER): Payer: Medicare Other | Admitting: Physician Assistant

## 2019-05-16 ENCOUNTER — Other Ambulatory Visit: Payer: Self-pay

## 2019-05-16 VITALS — BP 151/78 | HR 72 | Resp 14 | Ht 68.5 in | Wt 245.0 lb

## 2019-05-16 DIAGNOSIS — Z8679 Personal history of other diseases of the circulatory system: Secondary | ICD-10-CM | POA: Diagnosis not present

## 2019-05-16 DIAGNOSIS — Z79899 Other long term (current) drug therapy: Secondary | ICD-10-CM

## 2019-05-16 DIAGNOSIS — G8929 Other chronic pain: Secondary | ICD-10-CM

## 2019-05-16 DIAGNOSIS — M25561 Pain in right knee: Secondary | ICD-10-CM

## 2019-05-16 DIAGNOSIS — M359 Systemic involvement of connective tissue, unspecified: Secondary | ICD-10-CM

## 2019-05-16 DIAGNOSIS — Z8639 Personal history of other endocrine, nutritional and metabolic disease: Secondary | ICD-10-CM | POA: Diagnosis not present

## 2019-05-16 DIAGNOSIS — M7121 Synovial cyst of popliteal space [Baker], right knee: Secondary | ICD-10-CM

## 2019-05-16 DIAGNOSIS — L920 Granuloma annulare: Secondary | ICD-10-CM | POA: Diagnosis not present

## 2019-05-16 DIAGNOSIS — M19041 Primary osteoarthritis, right hand: Secondary | ICD-10-CM | POA: Diagnosis not present

## 2019-05-16 DIAGNOSIS — M25562 Pain in left knee: Secondary | ICD-10-CM | POA: Diagnosis not present

## 2019-05-16 DIAGNOSIS — M19042 Primary osteoarthritis, left hand: Secondary | ICD-10-CM | POA: Diagnosis not present

## 2019-05-16 NOTE — Patient Instructions (Signed)
Journal for Nurse Practitioners, 15(4), 263-267. Retrieved May 27, 2018 from [email or web address]">  Knee Exercises Ask your health care provider which exercises are safe for you. Do exercises exactly as told by your health care provider and adjust them as directed. It is normal to feel mild stretching, pulling, tightness, or discomfort as you do these exercises. Stop right away if you feel sudden pain or your pain gets worse. Do not begin these exercises until told by your health care provider. Stretching and range-of-motion exercises These exercises warm up your muscles and joints and improve the movement and flexibility of your knee. These exercises also help to relieve pain and swelling. Knee extension, prone 1. Lie on your abdomen (prone position) on a bed. 2. Place your left / right knee just beyond the edge of the surface so your knee is not on the bed. You can put a towel under your left / right thigh just above your kneecap for comfort. 3. Relax your leg muscles and allow gravity to straighten your knee (extension). You should feel a stretch behind your left / right knee. 4. Hold this position for __________ seconds. 5. Scoot up so your knee is supported between repetitions. Repeat __________ times. Complete this exercise __________ times a day. Knee flexion, active  1. Lie on your back with both legs straight. If this causes back discomfort, bend your left / right knee so your foot is flat on the floor. 2. Slowly slide your left / right heel back toward your buttocks. Stop when you feel a gentle stretch in the front of your knee or thigh (flexion). 3. Hold this position for __________ seconds. 4. Slowly slide your left / right heel back to the starting position. Repeat __________ times. Complete this exercise __________ times a day. Quadriceps stretch, prone  1. Lie on your abdomen on a firm surface, such as a bed or padded floor. 2. Bend your left / right knee and hold  your ankle. If you cannot reach your ankle or pant leg, loop a belt around your foot and grab the belt instead. 3. Gently pull your heel toward your buttocks. Your knee should not slide out to the side. You should feel a stretch in the front of your thigh and knee (quadriceps). 4. Hold this position for __________ seconds. Repeat __________ times. Complete this exercise __________ times a day. Hamstring, supine 1. Lie on your back (supine position). 2. Loop a belt or towel over the ball of your left / right foot. The ball of your foot is on the walking surface, right under your toes. 3. Straighten your left / right knee and slowly pull on the belt to raise your leg until you feel a gentle stretch behind your knee (hamstring). ? Do not let your knee bend while you do this. ? Keep your other leg flat on the floor. 4. Hold this position for __________ seconds. Repeat __________ times. Complete this exercise __________ times a day. Strengthening exercises These exercises build strength and endurance in your knee. Endurance is the ability to use your muscles for a long time, even after they get tired. Quadriceps, isometric This exercise stretches the muscles in front of your thigh (quadriceps) without moving your knee joint (isometric). 1. Lie on your back with your left / right leg extended and your other knee bent. Put a rolled towel or small pillow under your knee if told by your health care provider. 2. Slowly tense the muscles in the front of your left /   right thigh. You should see your kneecap slide up toward your hip or see increased dimpling just above the knee. This motion will push the back of the knee toward the floor. 3. For __________ seconds, hold the muscle as tight as you can without increasing your pain. 4. Relax the muscles slowly and completely. Repeat __________ times. Complete this exercise __________ times a day. Straight leg raises This exercise stretches the muscles in front  of your thigh (quadriceps) and the muscles that move your hips (hip flexors). 1. Lie on your back with your left / right leg extended and your other knee bent. 2. Tense the muscles in the front of your left / right thigh. You should see your kneecap slide up or see increased dimpling just above the knee. Your thigh may even shake a bit. 3. Keep these muscles tight as you raise your leg 4-6 inches (10-15 cm) off the floor. Do not let your knee bend. 4. Hold this position for __________ seconds. 5. Keep these muscles tense as you lower your leg. 6. Relax your muscles slowly and completely after each repetition. Repeat __________ times. Complete this exercise __________ times a day. Hamstring, isometric 1. Lie on your back on a firm surface. 2. Bend your left / right knee about __________ degrees. 3. Dig your left / right heel into the surface as if you are trying to pull it toward your buttocks. Tighten the muscles in the back of your thighs (hamstring) to "dig" as hard as you can without increasing any pain. 4. Hold this position for __________ seconds. 5. Release the tension gradually and allow your muscles to relax completely for __________ seconds after each repetition. Repeat __________ times. Complete this exercise __________ times a day. Hamstring curls If told by your health care provider, do this exercise while wearing ankle weights. Begin with __________ lb weights. Then increase the weight by 1 lb (0.5 kg) increments. Do not wear ankle weights that are more than __________ lb. 1. Lie on your abdomen with your legs straight. 2. Bend your left / right knee as far as you can without feeling pain. Keep your hips flat against the floor. 3. Hold this position for __________ seconds. 4. Slowly lower your leg to the starting position. Repeat __________ times. Complete this exercise __________ times a day. Squats This exercise strengthens the muscles in front of your thigh and knee  (quadriceps). 1. Stand in front of a table, with your feet and knees pointing straight ahead. You may rest your hands on the table for balance but not for support. 2. Slowly bend your knees and lower your hips like you are going to sit in a chair. ? Keep your weight over your heels, not over your toes. ? Keep your lower legs upright so they are parallel with the table legs. ? Do not let your hips go lower than your knees. ? Do not bend lower than told by your health care provider. ? If your knee pain increases, do not bend as low. 3. Hold the squat position for __________ seconds. 4. Slowly push with your legs to return to standing. Do not use your hands to pull yourself to standing. Repeat __________ times. Complete this exercise __________ times a day. Wall slides This exercise strengthens the muscles in front of your thigh and knee (quadriceps). 1. Lean your back against a smooth wall or door, and walk your feet out 18-24 inches (46-61 cm) from it. 2. Place your feet hip-width apart. 3.   Slowly slide down the wall or door until your knees bend __________ degrees. Keep your knees over your heels, not over your toes. Keep your knees in line with your hips. 4. Hold this position for __________ seconds. Repeat __________ times. Complete this exercise __________ times a day. Straight leg raises This exercise strengthens the muscles that rotate the leg at the hip and move it away from your body (hip abductors). 1. Lie on your side with your left / right leg in the top position. Lie so your head, shoulder, knee, and hip line up. You may bend your bottom knee to help you keep your balance. 2. Roll your hips slightly forward so your hips are stacked directly over each other and your left / right knee is facing forward. 3. Leading with your heel, lift your top leg 4-6 inches (10-15 cm). You should feel the muscles in your outer hip lifting. ? Do not let your foot drift forward. ? Do not let your knee  roll toward the ceiling. 4. Hold this position for __________ seconds. 5. Slowly return your leg to the starting position. 6. Let your muscles relax completely after each repetition. Repeat __________ times. Complete this exercise __________ times a day. Straight leg raises This exercise stretches the muscles that move your hips away from the front of the pelvis (hip extensors). 1. Lie on your abdomen on a firm surface. You can put a pillow under your hips if that is more comfortable. 2. Tense the muscles in your buttocks and lift your left / right leg about 4-6 inches (10-15 cm). Keep your knee straight as you lift your leg. 3. Hold this position for __________ seconds. 4. Slowly lower your leg to the starting position. 5. Let your leg relax completely after each repetition. Repeat __________ times. Complete this exercise __________ times a day. This information is not intended to replace advice given to you by your health care provider. Make sure you discuss any questions you have with your health care provider. Document Released: 06/21/2005 Document Revised: 05/28/2018 Document Reviewed: 05/28/2018 [employer] Patient Education  2020 [employer] Hand Exercises Hand exercises can be helpful for almost anyone. These exercises can strengthen the hands, improve flexibility and movement, and increase blood flow to the hands. These results can make work and daily tasks easier. Hand exercises can be especially helpful for people who have joint pain from arthritis or have nerve damage from overuse (carpal tunnel syndrome). These exercises can also help people who have injured a hand. Exercises Most of these hand exercises are gentle stretching and motion exercises. It is usually safe to do them often throughout the day. Warming up your hands before exercise may help to reduce stiffness. You can do this with gentle massage or by placing your hands in warm water for 10-15 minutes. It is normal to feel  some stretching, pulling, tightness, or mild discomfort as you begin new exercises. This will gradually improve. Stop an exercise right away if you feel sudden, severe pain or your pain gets worse. Ask your health care provider which exercises are best for you. Knuckle bend or "claw" fist 6. Stand or sit with your arm, hand, and all five fingers pointed straight up. Make sure to keep your wrist straight during the exercise. 7. Gently bend your fingers down toward your palm until the tips of your fingers are touching the top of your palm. Keep your big knuckle straight and just bend the small knuckles in your fingers. 8. Hold   this position for __________ seconds. 9. Straighten (extend) your fingers back to the starting position. Repeat this exercise 5-10 times with each hand. Full finger fist 5. Stand or sit with your arm, hand, and all five fingers pointed straight up. Make sure to keep your wrist straight during the exercise. 6. Gently bend your fingers into your palm until the tips of your fingers are touching the middle of your palm. 7. Hold this position for __________ seconds. 8. Extend your fingers back to the starting position, stretching every joint fully. Repeat this exercise 5-10 times with each hand. Straight fist 5. Stand or sit with your arm, hand, and all five fingers pointed straight up. Make sure to keep your wrist straight during the exercise. 6. Gently bend your fingers at the big knuckle, where your fingers meet your hand, and the middle knuckle. Keep the knuckle at the tips of your fingers straight and try to touch the bottom of your palm. 7. Hold this position for __________ seconds. 8. Extend your fingers back to the starting position, stretching every joint fully. Repeat this exercise 5-10 times with each hand. Tabletop 1. Stand or sit with your arm, hand, and all five fingers pointed straight up. Make sure to keep your wrist straight during the exercise. 2. Gently bend  your fingers at the big knuckle, where your fingers meet your hand, as far down as you can while keeping the small knuckles in your fingers straight. Think of forming a tabletop with your fingers. 3. Hold this position for __________ seconds. 4. Extend your fingers back to the starting position, stretching every joint fully. Repeat this exercise 5-10 times with each hand. Finger spread 5. Place your hand flat on a table with your palm facing down. Make sure your wrist stays straight as you do this exercise. 6. Spread your fingers and thumb apart from each other as far as you can until you feel a gentle stretch. Hold this position for __________ seconds. 7. Bring your fingers and thumb tight together again. Hold this position for __________ seconds. Repeat this exercise 5-10 times with each hand. Making circles 7. Stand or sit with your arm, hand, and all five fingers pointed straight up. Make sure to keep your wrist straight during the exercise. 8. Make a circle by touching the tip of your thumb to the tip of your index finger. 9. Hold for __________ seconds. Then open your hand wide. 10. Repeat this motion with your thumb and each finger on your hand. Repeat this exercise 5-10 times with each hand. Thumb motion 6. Sit with your forearm resting on a table and your wrist straight. Your thumb should be facing up toward the ceiling. Keep your fingers relaxed as you move your thumb. 7. Lift your thumb up as high as you can toward the ceiling. Hold for __________ seconds. 8. Bend your thumb across your palm as far as you can, reaching the tip of your thumb for the small finger (pinkie) side of your palm. Hold for __________ seconds. Repeat this exercise 5-10 times with each hand. Grip strengthening  5. Hold a stress ball or other soft ball in the middle of your hand. 6. Slowly increase the pressure, squeezing the ball as much as you can without causing pain. Think of bringing the tips of your  fingers into the middle of your palm. All of your finger joints should bend when doing this exercise. 7. Hold your squeeze for __________ seconds, then relax. Repeat this exercise 5-10   times with each hand. Contact a health care provider if:  Your hand pain or discomfort gets much worse when you do an exercise.  Your hand pain or discomfort does not improve within 2 hours after you exercise. If you have any of these problems, stop doing these exercises right away. Do not do them again unless your health care provider says that you can. Get help right away if:  You develop sudden, severe hand pain or swelling. If this happens, stop doing these exercises right away. Do not do them again unless your health care provider says that you can. This information is not intended to replace advice given to you by your health care provider. Make sure you discuss any questions you have with your health care provider. Document Released: 07/19/2015 Document Revised: 11/28/2018 Document Reviewed: 08/08/2018 [employer] Patient Education  2020 [employer]  

## 2019-05-18 LAB — URINALYSIS, ROUTINE W REFLEX MICROSCOPIC
Bilirubin Urine: NEGATIVE
Glucose, UA: NEGATIVE
Hgb urine dipstick: NEGATIVE
Ketones, ur: NEGATIVE
Leukocytes,Ua: NEGATIVE
Nitrite: NEGATIVE
Protein, ur: NEGATIVE
Specific Gravity, Urine: 1.019 (ref 1.001–1.03)
pH: 5.5 (ref 5.0–8.0)

## 2019-05-18 LAB — CBC WITH DIFFERENTIAL/PLATELET
Absolute Monocytes: 463 cells/uL (ref 200–950)
Basophils Absolute: 25 cells/uL (ref 0–200)
Basophils Relative: 0.2 %
Eosinophils Absolute: 175 cells/uL (ref 15–500)
Eosinophils Relative: 1.4 %
HCT: 39.9 % (ref 35.0–45.0)
Hemoglobin: 13.2 g/dL (ref 11.7–15.5)
Lymphs Abs: 6113 cells/uL — ABNORMAL HIGH (ref 850–3900)
MCH: 29.5 pg (ref 27.0–33.0)
MCHC: 33.1 g/dL (ref 32.0–36.0)
MCV: 89.1 fL (ref 80.0–100.0)
MPV: 10.5 fL (ref 7.5–12.5)
Monocytes Relative: 3.7 %
Neutro Abs: 5725 cells/uL (ref 1500–7800)
Neutrophils Relative %: 45.8 %
Platelets: 270 10*3/uL (ref 140–400)
RBC: 4.48 10*6/uL (ref 3.80–5.10)
RDW: 12.9 % (ref 11.0–15.0)
Total Lymphocyte: 48.9 %
WBC: 12.5 10*3/uL — ABNORMAL HIGH (ref 3.8–10.8)

## 2019-05-18 LAB — COMPLETE METABOLIC PANEL WITH GFR
AG Ratio: 1.9 (calc) (ref 1.0–2.5)
ALT: 12 U/L (ref 6–29)
AST: 17 U/L (ref 10–35)
Albumin: 4.4 g/dL (ref 3.6–5.1)
Alkaline phosphatase (APISO): 101 U/L (ref 37–153)
BUN: 15 mg/dL (ref 7–25)
CO2: 28 mmol/L (ref 20–32)
Calcium: 9.7 mg/dL (ref 8.6–10.4)
Chloride: 102 mmol/L (ref 98–110)
Creat: 0.93 mg/dL (ref 0.60–0.93)
GFR, Est African American: 71 mL/min/{1.73_m2} (ref >=60)
GFR, Est Non African American: 61 mL/min/{1.73_m2} (ref >=60)
Globulin: 2.3 g/dL (calc) (ref 1.9–3.7)
Glucose, Bld: 97 mg/dL (ref 65–99)
Potassium: 3.9 mmol/L (ref 3.5–5.3)
Sodium: 141 mmol/L (ref 135–146)
Total Bilirubin: 0.6 mg/dL (ref 0.2–1.2)
Total Protein: 6.7 g/dL (ref 6.1–8.1)

## 2019-05-18 LAB — C3 AND C4
C3 Complement: 151 mg/dL (ref 83–193)
C4 Complement: 33 mg/dL (ref 15–57)

## 2019-05-18 LAB — TSH: TSH: 0.46 mIU/L (ref 0.40–4.50)

## 2019-05-18 LAB — ANTI-DNA ANTIBODY, DOUBLE-STRANDED: ds DNA Ab: 7 IU/mL — ABNORMAL HIGH

## 2019-05-18 LAB — SEDIMENTATION RATE: Sed Rate: 14 mm/h (ref 0–30)

## 2019-05-19 NOTE — Progress Notes (Signed)
WBC count is borderline elevated-12.5.  Please ask if the patient has had a recent infection or has been on prednisone.  CMP WNL. Complement WNL.   UA WNL.  TSH WNL.  Sed rate WNL.  DsDNA is 7 (stable).

## 2019-10-27 ENCOUNTER — Telehealth: Payer: Self-pay | Admitting: Rheumatology

## 2019-10-27 NOTE — Telephone Encounter (Signed)
Patient called requesting a return call with Dr. Arlean Hopping opinion on "red light therapy" which patient states is similar to a tanning bed.

## 2019-10-28 ENCOUNTER — Encounter: Payer: Self-pay | Admitting: Physician Assistant

## 2019-10-28 ENCOUNTER — Telehealth: Payer: Self-pay | Admitting: Rheumatology

## 2019-10-28 NOTE — Telephone Encounter (Signed)
I called patient, patient will consult with dermatologist regarding "infrared light therapy - red light". Patient will avoid UV light.

## 2019-10-28 NOTE — Telephone Encounter (Signed)
Patient called stating she was returning your call.   °

## 2019-10-28 NOTE — Telephone Encounter (Signed)
I spoke with Dr. Estanislado Pandy and she would like the patient to discuss infrared light therapy with her dermatologist.  She should avoid UV light exposure due to her history of underlying autoimmune disease.

## 2019-10-28 NOTE — Telephone Encounter (Signed)
Attempted to contact the patient and left message for patient to call the office.  

## 2019-11-06 NOTE — Progress Notes (Signed)
Office Visit Note  Patient: Jacqueline Casey             Date of Birth: [birth year]           MRN: [id number]             PCP: Jake Samples, PA-C Referring: Jake Samples, [place]* Visit Date: 11/14/2019 Occupation: @GUAROCC @  Subjective:  Right wrist joint pain   History of Present Illness: Jacqueline Casey is a 74 y.o. female with history of autoimmune disease and osteoarthritis.  Patient is not taking an immunosuppressive agent at this time.  She cannot tolerate taking Plaquenil in the past.  She continues to have pain and inflammation in the right wrist joint but states it is improved.  She has been using a topical agent which has helped with her discomfort and inflammation.  She experiences intermittent pain in both knee joints but denies any joint swelling.  She has a Baker's cyst behind the right knee.  She denies any other signs or symptoms of autoimmune disease.  She has not had any recent oral nasal ulcerations, rashes, photosensitivity, hair loss, or symptoms of Raynaud's.  She continues to have chronic sicca symptoms.  Activities of Daily Living:  Patient reports morning stiffness for 15 minutes.   Patient Denies nocturnal pain.  Difficulty dressing/grooming: Denies Difficulty climbing stairs: Reports Difficulty getting out of chair: Reports Difficulty using hands for taps, buttons, cutlery, and/or writing: Reports  Review of Systems  Constitutional: Positive for fatigue.  HENT: Positive for mouth dryness. Negative for mouth sores and nose dryness.   Eyes: Negative for pain, visual disturbance and dryness.  Respiratory: Negative for cough, hemoptysis, shortness of breath and difficulty breathing.   Cardiovascular: Positive for swelling in legs/feet. Negative for chest pain, palpitations and hypertension.  Gastrointestinal: Negative for blood in stool, constipation and diarrhea.  Endocrine: Negative for increased urination.  Genitourinary: Negative for  difficulty urinating and painful urination.  Musculoskeletal: Positive for arthralgias, joint pain, joint swelling, morning stiffness and muscle tenderness. Negative for myalgias, muscle weakness and myalgias.  Skin: Negative for color change, pallor, rash, hair loss, nodules/bumps, skin tightness, ulcers and sensitivity to sunlight.  Allergic/Immunologic: Negative for susceptible to infections.  Neurological: Negative for dizziness, numbness, headaches and weakness.  Hematological: Negative for bruising/bleeding tendency and swollen glands.  Psychiatric/Behavioral: Negative for depressed mood and sleep disturbance. The patient is not nervous/anxious.     PMFS History:  Patient Active Problem List   Diagnosis Date Noted  . Baker's cyst of knee 10/18/2011  . Knee mass 10/04/2011    Past Medical History:  Diagnosis Date  . Baker's cyst of knee 10/18/2011  . Hypertension   . Osteoarthritis   . Thyroid disease     Family History  Problem Relation Age of Onset  . Cancer Other   . Diabetes Other   . Cancer Mother        breast  . COPD Mother   . Leukemia Father   . Osteoporosis Sister   . Asthma Sister   . Dermatomyositis Sister   . COPD Sister   . Kidney disease Sister   . Cancer Son        lymphoma, colon  . COPD Daughter   . Rheum arthritis Daughter    Past Surgical History:  Procedure Laterality Date  . FOOT SURGERY Right 2017   hammer toe   . HAND SURGERY Left 2014   finger, hand  . SKIN TAG REMOVAL  11/07/2018  stomach and below left eye   . THYROID SURGERY    . TUBAL LIGATION    . VAGINAL HYSTERECTOMY     Social History   Social History Narrative  . Not on file    There is no immunization history on file for this patient.   Objective: Vital Signs: BP 132/75 (BP Location: Left Arm, Patient Position: Sitting, Cuff Size: Normal)   Pulse 87   Resp 18   Ht 5' 8.5" (1.74 m)   Wt 247 lb 3.2 oz (112.1 kg)   BMI 37.04 kg/m    Physical Exam Vitals and  nursing note reviewed.  Constitutional:      Appearance: She is well-developed.  HENT:     Head: Normocephalic and atraumatic.  Eyes:     Conjunctiva/sclera: Conjunctivae normal.  Pulmonary:     Effort: Pulmonary effort is normal.  Abdominal:     General: Bowel sounds are normal.     Palpations: Abdomen is soft.  Musculoskeletal:     Cervical back: Normal range of motion.  Lymphadenopathy:     Cervical: No cervical adenopathy.  Skin:    General: Skin is warm and dry.     Capillary Refill: Capillary refill takes less than 2 seconds.  Neurological:     Mental Status: She is alert and oriented to person, place, and time.  Psychiatric:        Behavior: Behavior normal.      Musculoskeletal Exam: C-spine good ROM with no discomfort.  Thoracic kyphosis noted.  Shoulder joints and elbow joints have good range of motion with no discomfort.  She has tenderness and synovitis on the radial aspect of the right wrist.  Limited range of motion of the right wrist noted.  She has complete fist formation bilaterally.  PIP and DIP thickening consistent with osteoarthritis of both hands noted.  Hip joints have good range of motion with no discomfort.  Knee joints have good range of motion with no warmth or effusion.  Palpable pop with popliteal cyst posterior aspect of the right knee.  Ankle joints have good range of motion with no tenderness or inflammation.  Pedal edema noted bilaterally.  CDAI Exam: CDAI Score: - Patient Global: -; Provider Global: - Swollen: -; Tender: - Joint Exam 11/14/2019   No joint exam has been documented for this visit   There is currently no information documented on the homunculus. Go to the Rheumatology activity and complete the homunculus joint exam.  Investigation: No additional findings.  Imaging: No results found.  Recent Labs: Lab Results  Component Value Date   WBC 12.5 (H) 05/16/2019   HGB 13.2 05/16/2019   PLT 270 05/16/2019   NA 141 05/16/2019    K 3.9 05/16/2019   CL 102 05/16/2019   CO2 28 05/16/2019   GLUCOSE 97 05/16/2019   BUN 15 05/16/2019   CREATININE 0.93 05/16/2019   BILITOT 0.6 05/16/2019   AST 17 05/16/2019   ALT 12 05/16/2019   PROT 6.7 05/16/2019   CALCIUM 9.7 05/16/2019   GFRAA 71 05/16/2019    Speciality Comments: PLQ eye exam: 03/06/2018 normal. Dr. Marvel Plan. Follow up in 6 months.  Procedures:  No procedures performed Allergies: Sulfa antibiotics   Assessment / Plan:     Visit Diagnoses: Autoimmune disease ([place]) - ANA 1:160 NH, dsDNA 7, C3/C4 WNL, sed rate WNL: She has not had any signs or symptoms of an autoimmune disease flare recently.  She is not taking any immunosuppressive agents at this  time.  She was previously taking Plaquenil but discontinued due to an inadequate response as well as increased hair loss.  She has not developed any new or worsening symptoms since discontinuing Plaquenil.  She continues to have tenderness and synovitis of the right wrist but the inflammation has improved.  She has been applying topical agents as needed which helps with the pain and inflammation.  She declined a cortisone injection today.  She has not had any recent rashes, hair loss, photosensitivity, oral nasal ulcerations, symptoms of Raynaud's, enlarged lymph nodes, chest pain, or shortness of breath.  She continues to have chronic sicca symptoms which have been tolerable.  We will check autoimmune lab work today.  She was advised to notify us if she develops any new or worsening symptoms.  She will follow-up in the office in 6 months.- Plan: CBC with Differential/Platelet, COMPLETE METABOLIC PANEL WITH GFR, Urinalysis, Routine w reflex microscopic, Anti-DNA antibody, double-stranded, C3 and C4, Sedimentation rate, ANA  High risk medication use - D/c PLQ-inadequate response and SE of hair loss.  CBC and CMP will be drawn today.- Plan: CBC with Differential/Platelet, COMPLETE METABOLIC PANEL WITH GFR  Primary osteoarthritis  of both hands: She has PIP and DIP thickening consistent with osteoarthritis of both hands.  She has complete fist formation bilaterally.  She has been crafting more frequently which has been helping with her stiffness and grip strength.  Joint protection and muscle strengthening were discussed.  She was given a handout of hand exercises to perform. She has ongoing tenderness and inflammation on the radial aspect of the right wrist.  She has been applying topical agents which help with the pain and inflammation.  She declined scheduling a cortisone injection at this time.  Synovial cyst of right popliteal space - She has a Baker's cyst palpable in the popliteal space of the right knee.    Other medical conditions are listed as follows:   Granuloma annulare - She is followed by dermatology  History of hypothyroidism  History of hypertension    Orders: Orders Placed This Encounter  Procedures  . CBC with Differential/Platelet  . COMPLETE METABOLIC PANEL WITH GFR  . Urinalysis, Routine w reflex microscopic  . Anti-DNA antibody, double-stranded  . C3 and C4  . Sedimentation rate  . ANA   No orders of the defined types were placed in this encounter.    Follow-Up Instructions: Return in about 6 months (around 05/16/2020) for Autoimmune Disease, Osteoarthritis.   Ofilia Neas, PA-C  Note - This record has been created using Dragon software.  Chart creation errors have been sought, but may not always  have been located. Such creation errors do not reflect on  the standard of medical care.

## 2019-11-14 ENCOUNTER — Ambulatory Visit (INDEPENDENT_AMBULATORY_CARE_PROVIDER_SITE_OTHER): Payer: Medicare Other | Admitting: Physician Assistant

## 2019-11-14 ENCOUNTER — Encounter (INDEPENDENT_AMBULATORY_CARE_PROVIDER_SITE_OTHER): Payer: Self-pay

## 2019-11-14 ENCOUNTER — Other Ambulatory Visit: Payer: Self-pay

## 2019-11-14 ENCOUNTER — Encounter: Payer: Self-pay | Admitting: Physician Assistant

## 2019-11-14 VITALS — BP 132/75 | HR 87 | Resp 18 | Ht 68.5 in | Wt 247.2 lb

## 2019-11-14 DIAGNOSIS — M19041 Primary osteoarthritis, right hand: Secondary | ICD-10-CM | POA: Diagnosis not present

## 2019-11-14 DIAGNOSIS — M359 Systemic involvement of connective tissue, unspecified: Secondary | ICD-10-CM

## 2019-11-14 DIAGNOSIS — Z79899 Other long term (current) drug therapy: Secondary | ICD-10-CM

## 2019-11-14 DIAGNOSIS — L920 Granuloma annulare: Secondary | ICD-10-CM | POA: Diagnosis not present

## 2019-11-14 DIAGNOSIS — M7121 Synovial cyst of popliteal space [Baker], right knee: Secondary | ICD-10-CM

## 2019-11-14 DIAGNOSIS — Z8639 Personal history of other endocrine, nutritional and metabolic disease: Secondary | ICD-10-CM | POA: Diagnosis not present

## 2019-11-14 DIAGNOSIS — M19042 Primary osteoarthritis, left hand: Secondary | ICD-10-CM

## 2019-11-14 DIAGNOSIS — Z8679 Personal history of other diseases of the circulatory system: Secondary | ICD-10-CM

## 2019-11-17 LAB — URINALYSIS, ROUTINE W REFLEX MICROSCOPIC
Bilirubin Urine: NEGATIVE
Glucose, UA: NEGATIVE
Hgb urine dipstick: NEGATIVE
Ketones, ur: NEGATIVE
Leukocytes,Ua: NEGATIVE
Nitrite: NEGATIVE
Protein, ur: NEGATIVE
Specific Gravity, Urine: 1.011 (ref 1.001–1.03)
pH: 5.5 (ref 5.0–8.0)

## 2019-11-17 LAB — CBC WITH DIFFERENTIAL/PLATELET
Absolute Monocytes: 542 cells/uL (ref 200–950)
Basophils Absolute: 56 cells/uL (ref 0–200)
Basophils Relative: 0.4 %
Eosinophils Absolute: 195 cells/uL (ref 15–500)
Eosinophils Relative: 1.4 %
HCT: 38.9 % (ref 35.0–45.0)
Hemoglobin: 12.9 g/dL (ref 11.7–15.5)
Lymphs Abs: 7589 cells/uL — ABNORMAL HIGH (ref 850–3900)
MCH: 29.4 pg (ref 27.0–33.0)
MCHC: 33.2 g/dL (ref 32.0–36.0)
MCV: 88.6 fL (ref 80.0–100.0)
MPV: 10.5 fL (ref 7.5–12.5)
Monocytes Relative: 3.9 %
Neutro Abs: 5518 cells/uL (ref 1500–7800)
Neutrophils Relative %: 39.7 %
Platelets: 282 10*3/uL (ref 140–400)
RBC: 4.39 10*6/uL (ref 3.80–5.10)
RDW: 12.7 % (ref 11.0–15.0)
Total Lymphocyte: 54.6 %
WBC: 13.9 10*3/uL — ABNORMAL HIGH (ref 3.8–10.8)

## 2019-11-17 LAB — COMPLETE METABOLIC PANEL WITH GFR
AG Ratio: 1.8 (calc) (ref 1.0–2.5)
ALT: 12 U/L (ref 6–29)
AST: 15 U/L (ref 10–35)
Albumin: 4.2 g/dL (ref 3.6–5.1)
Alkaline phosphatase (APISO): 87 U/L (ref 37–153)
BUN: 18 mg/dL (ref 7–25)
CO2: 27 mmol/L (ref 20–32)
Calcium: 9.6 mg/dL (ref 8.6–10.4)
Chloride: 104 mmol/L (ref 98–110)
Creat: 0.92 mg/dL (ref 0.60–0.93)
GFR, Est African American: 72 mL/min/{1.73_m2} (ref >=60)
GFR, Est Non African American: 62 mL/min/{1.73_m2} (ref >=60)
Globulin: 2.3 g/dL (calc) (ref 1.9–3.7)
Glucose, Bld: 106 mg/dL — ABNORMAL HIGH (ref 65–99)
Potassium: 4 mmol/L (ref 3.5–5.3)
Sodium: 141 mmol/L (ref 135–146)
Total Bilirubin: 0.5 mg/dL (ref 0.2–1.2)
Total Protein: 6.5 g/dL (ref 6.1–8.1)

## 2019-11-17 LAB — ANTI-DNA ANTIBODY, DOUBLE-STRANDED: ds DNA Ab: 9 IU/mL — ABNORMAL HIGH

## 2019-11-17 LAB — C3 AND C4
C3 Complement: 149 mg/dL (ref 83–193)
C4 Complement: 32 mg/dL (ref 15–57)

## 2019-11-17 LAB — ANA: Anti Nuclear Antibody (ANA): NEGATIVE

## 2019-11-17 LAB — SEDIMENTATION RATE: Sed Rate: 11 mm/h (ref 0–30)

## 2019-11-19 NOTE — Progress Notes (Signed)
WBC count is elevated and trending up.  Lymphocytosis noted.  Please refer to hematology for further evaluation.  Rest of CBC WNL.  CMP WNL.  ESR WNL. Complements WNL. UA normal. ANA negative. DsDNA remains positive.  Please advise patient to notify us if she develops any new or worsening symptoms of autoimmune disease.

## 2019-11-20 ENCOUNTER — Telehealth: Payer: Self-pay | Admitting: *Deleted

## 2019-11-20 DIAGNOSIS — D72820 Lymphocytosis (symptomatic): Secondary | ICD-10-CM

## 2019-11-20 NOTE — Telephone Encounter (Signed)
-----  Message from Ofilia Neas, PA-C sent at 11/19/2019  5:23 PM EDT ----- WBC count is elevated and trending up.  Lymphocytosis noted.  Please refer to hematology for further evaluation.  Rest of CBC WNL.  CMP WNL.  ESR WNL. Complements WNL. UA normal. ANA negative. DsDNA remains positive.  Please advise patient to notify  [place] if she develops any new or worsening symptoms of autoimmune disease.

## 2019-12-02 ENCOUNTER — Encounter (HOSPITAL_COMMUNITY): Payer: Self-pay

## 2019-12-03 ENCOUNTER — Inpatient Hospital Stay (HOSPITAL_COMMUNITY): Payer: Medicare Other | Attending: Hematology | Admitting: Hematology

## 2019-12-03 ENCOUNTER — Encounter (HOSPITAL_COMMUNITY): Payer: Self-pay | Admitting: Hematology

## 2019-12-03 ENCOUNTER — Inpatient Hospital Stay (HOSPITAL_COMMUNITY): Payer: Medicare Other

## 2019-12-03 ENCOUNTER — Other Ambulatory Visit: Payer: Self-pay

## 2019-12-03 DIAGNOSIS — Z833 Family history of diabetes mellitus: Secondary | ICD-10-CM | POA: Diagnosis not present

## 2019-12-03 DIAGNOSIS — D72829 Elevated white blood cell count, unspecified: Secondary | ICD-10-CM | POA: Insufficient documentation

## 2019-12-03 DIAGNOSIS — Z803 Family history of malignant neoplasm of breast: Secondary | ICD-10-CM | POA: Insufficient documentation

## 2019-12-03 DIAGNOSIS — E079 Disorder of thyroid, unspecified: Secondary | ICD-10-CM | POA: Insufficient documentation

## 2019-12-03 DIAGNOSIS — M8580 Other specified disorders of bone density and structure, unspecified site: Secondary | ICD-10-CM | POA: Diagnosis not present

## 2019-12-03 DIAGNOSIS — D72820 Lymphocytosis (symptomatic): Secondary | ICD-10-CM

## 2019-12-03 DIAGNOSIS — Z79899 Other long term (current) drug therapy: Secondary | ICD-10-CM | POA: Insufficient documentation

## 2019-12-03 DIAGNOSIS — M069 Rheumatoid arthritis, unspecified: Secondary | ICD-10-CM | POA: Insufficient documentation

## 2019-12-03 DIAGNOSIS — Z87891 Personal history of nicotine dependence: Secondary | ICD-10-CM | POA: Diagnosis not present

## 2019-12-03 DIAGNOSIS — Z90710 Acquired absence of both cervix and uterus: Secondary | ICD-10-CM | POA: Diagnosis not present

## 2019-12-03 DIAGNOSIS — I10 Essential (primary) hypertension: Secondary | ICD-10-CM | POA: Diagnosis not present

## 2019-12-03 DIAGNOSIS — Z800 Family history of malignant neoplasm of digestive organs: Secondary | ICD-10-CM | POA: Diagnosis not present

## 2019-12-03 DIAGNOSIS — Z791 Long term (current) use of non-steroidal anti-inflammatories (NSAID): Secondary | ICD-10-CM | POA: Diagnosis not present

## 2019-12-03 DIAGNOSIS — Z807 Family history of other malignant neoplasms of lymphoid, hematopoietic and related tissues: Secondary | ICD-10-CM | POA: Diagnosis not present

## 2019-12-03 DIAGNOSIS — Z806 Family history of leukemia: Secondary | ICD-10-CM | POA: Insufficient documentation

## 2019-12-03 DIAGNOSIS — Z8261 Family history of arthritis: Secondary | ICD-10-CM | POA: Diagnosis not present

## 2019-12-03 LAB — LACTATE DEHYDROGENASE: LDH: 159 U/L (ref 98–192)

## 2019-12-03 LAB — COMPREHENSIVE METABOLIC PANEL
ALT: 18 U/L (ref 0–44)
AST: 20 U/L (ref 15–41)
Albumin: 4.2 g/dL (ref 3.5–5.0)
Alkaline Phosphatase: 82 U/L (ref 38–126)
Anion gap: 11 (ref 5–15)
BUN: 20 mg/dL (ref 8–23)
CO2: 27 mmol/L (ref 22–32)
Calcium: 9.1 mg/dL (ref 8.9–10.3)
Chloride: 100 mmol/L (ref 98–111)
Creatinine, Ser: 0.89 mg/dL (ref 0.44–1.00)
GFR calc Af Amer: >60 mL/min (ref >60)
GFR calc non Af Amer: >60 mL/min (ref >60)
Glucose, Bld: 122 mg/dL — ABNORMAL HIGH (ref 70–99)
Potassium: 3.5 mmol/L (ref 3.5–5.1)
Sodium: 138 mmol/L (ref 135–145)
Total Bilirubin: 0.5 mg/dL (ref 0.3–1.2)
Total Protein: 7.1 g/dL (ref 6.5–8.1)

## 2019-12-03 LAB — CBC WITH DIFFERENTIAL/PLATELET
Abs Immature Granulocytes: 0.05 10*3/uL (ref 0.00–0.07)
Basophils Absolute: 0.1 10*3/uL (ref 0.0–0.1)
Basophils Relative: 0 %
Eosinophils Absolute: 0.2 10*3/uL (ref 0.0–0.5)
Eosinophils Relative: 2 %
HCT: 40 % (ref 36.0–46.0)
Hemoglobin: 12.6 g/dL (ref 12.0–15.0)
Immature Granulocytes: 0 %
Lymphocytes Relative: 49 %
Lymphs Abs: 7.7 10*3/uL — ABNORMAL HIGH (ref 0.7–4.0)
MCH: 29 pg (ref 26.0–34.0)
MCHC: 31.5 g/dL (ref 30.0–36.0)
MCV: 92.2 fL (ref 80.0–100.0)
Monocytes Absolute: 0.9 10*3/uL (ref 0.1–1.0)
Monocytes Relative: 6 %
Neutro Abs: 6.7 10*3/uL (ref 1.7–7.7)
Neutrophils Relative %: 43 %
Platelets: 262 10*3/uL (ref 150–400)
RBC: 4.34 MIL/uL (ref 3.87–5.11)
RDW: 12.9 % (ref 11.5–15.5)
WBC: 15.6 10*3/uL — ABNORMAL HIGH (ref 4.0–10.5)
nRBC: 0 % (ref 0.0–0.2)

## 2019-12-03 LAB — SAVE SMEAR(SSMR), FOR PROVIDER SLIDE REVIEW

## 2019-12-03 LAB — VITAMIN B12: Vitamin B-12: 177 pg/mL — ABNORMAL LOW (ref 180–914)

## 2019-12-03 LAB — VITAMIN D 25 HYDROXY (VIT D DEFICIENCY, FRACTURES): Vit D, 25-Hydroxy: 24 ng/mL — ABNORMAL LOW (ref 30–100)

## 2019-12-03 NOTE — Progress Notes (Signed)
CONSULT NOTE  Patient Care Team: Scherrie Bateman as PCP - General (Family Medicine)  CHIEF COMPLAINTS/PURPOSE OF CONSULTATION: Lymphocytosis  HISTORY OF PRESENTING ILLNESS:  Jacqueline Casey 73 y.o. female was sent here by her rheumatoid doctor for lymphocytosis.  Patient reports she has known she has had leukocytosis for a couple years.  She reports that she was diagnosed with an autoimmune disorder.  She reports she has rheumatoid arthritis.  She was on Plaquenil for a short period of time.  It was discontinued due to extreme hair loss.  Patient has also had half of her thyroid removed.  She is now taking thyroid medication.  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  She denies any chronic infections.  She denies any steroid or antibiotic use.  She reports she started smoking at the age of 61 and stop smoking at the age of 60.  She smoked roughly 1 pack/day.  She denies any alcohol or illicit drugs.  She denies any B symptoms including night sweats, fevers, chills or unexplained weight loss.  She denies any new medications.  She has not noticed any recent bleeding such as epistasis, hematuria or hematochezia.  She denies any recent chest pain on exertion, shortness of breath on minimal exertion, presyncopal episodes or palpitations.  Patient denies any over-the-counter NSAIDs.  She has no prior history or diagnosis of cancer.  She has a family history of a father with leukemia.  A mother with breast cancer.  Maternal aunt with lung cancer.  Maternal uncle with prostate cancer and a son who had colon cancer and lymphoma.   MEDICAL HISTORY:  Past Medical History:  Diagnosis Date  . Baker's cyst of knee 10/18/2011  . Hypertension   . Osteoarthritis   . Thyroid disease     SURGICAL HISTORY: Past Surgical History:  Procedure Laterality Date  . FOOT SURGERY Right 2017   hammer toe   . HAND SURGERY Left 2014   finger, hand  . SKIN TAG REMOVAL  11/07/2018   stomach and below  left eye   . THYROID SURGERY    . TUBAL LIGATION    . VAGINAL HYSTERECTOMY      SOCIAL HISTORY: Social History   Socioeconomic History  . Marital status: Married    Spouse name: Not on file  . Number of children: 3  . Years of education: 32  . Highest education level: Not on file  Occupational History  . Not on file  Tobacco Use  . Smoking status: Former Smoker    Packs/day: 1.50    Years: 15.00    Pack years: 22.50    Types: Cigarettes    Quit date: 11/14/1999    Years since quitting: 20.0  . Smokeless tobacco: Never Used  Substance and Sexual Activity  . Alcohol use: No  . Drug use: No  . Sexual activity: Not on file  Other Topics Concern  . Not on file  Social History Narrative  . Not on file   Social Determinants of Health   Financial Resource Strain:   . Difficulty of Paying Living Expenses:   Food Insecurity:   . Worried About Charity fundraiser in the Last Year:   . Arboriculturist in the Last Year:   Transportation Needs:   . Film/video editor (Medical):   Marland Kitchen Lack of Transportation (Non-Medical):   Physical Activity:   . Days of Exercise per Week:   . Minutes of Exercise per Session:  Stress:   . Feeling of Stress :   Social Connections:   . Frequency of Communication with Friends and Family:   . Frequency of Social Gatherings with Friends and Family:   . Attends Religious Services:   . Active Member of Clubs or Organizations:   . Attends Archivist Meetings:   Marland Kitchen Marital Status:   Intimate Partner Violence:   . Fear of Current or Ex-Partner:   . Emotionally Abused:   Marland Kitchen Physically Abused:   . Sexually Abused:     FAMILY HISTORY: Family History  Problem Relation Age of Onset  . Cancer Other   . Diabetes Other   . Cancer Mother        breast  . COPD Mother   . Leukemia Father   . Osteoporosis Sister   . Asthma Sister   . Dermatomyositis Sister   . COPD Sister   . Kidney disease Sister   . Cancer Son        lymphoma,  colon  . Diabetes Son   . COPD Daughter   . Osteoporosis Daughter   . Rheum arthritis Daughter     ALLERGIES:  is allergic to sulfa antibiotics.  MEDICATIONS:  Current Outpatient Medications  Medication Sig Dispense Refill  . acetaminophen (TYLENOL 8 HOUR ARTHRITIS PAIN) 650 MG CR tablet Take 650 mg by mouth as needed for pain.    . hydrochlorothiazide (HYDRODIURIL) 12.5 MG tablet Take 12.5 mg by mouth daily.    Marland Kitchen levothyroxine (SYNTHROID, LEVOTHROID) 88 MCG tablet Take 88 mcg by mouth daily.    . diclofenac sodium (VOLTAREN) 1 % GEL Apply 3 grams to 3 large joints up to 3 times daily. (Patient not taking: Reported on 12/03/2019) 3 Tube 3  . hydrochlorothiazide (HYDRODIURIL) 25 MG tablet Take 12.5 mg by mouth daily.     Marland Kitchen ibuprofen (ADVIL,MOTRIN) 200 MG tablet Take 200 mg by mouth every 6 (six) hours as needed.    . triamcinolone cream (KENALOG) 0.1 % APPLY CREAM EXTERNALLY TO AFFECTED AREA TWICE DAILY AS NEEDED  3   No current facility-administered medications for this visit.    REVIEW OF SYSTEMS:   Constitutional: Denies fevers, chills or abnormal night sweats Respiratory: Denies cough, dyspnea or wheezes Cardiovascular: Denies palpitation, chest discomfort or lower extremity swelling Gastrointestinal:  Denies nausea, heartburn or change in bowel habits Skin: Denies abnormal skin rashes Lymphatics: Denies new lymphadenopathy or easy bruising Neurological:Denies numbness, tingling or new weaknesses Behavioral/Psych: Mood is stable, no new changes  All other systems were reviewed with the patient and are negative.  PHYSICAL EXAMINATION: ECOG PERFORMANCE STATUS: 1 - Symptomatic but completely ambulatory  Vitals:   12/03/19 1421  BP: 129/66  Pulse: 95  Resp: 18  Temp: (!) 96 F (35.6 C)  SpO2: 98%   Filed Weights   12/03/19 1421  Weight: 247 lb (112 kg)    GENERAL:alert, no distress and comfortable SKIN: skin color, texture, turgor are normal, no rashes or significant  lesions NECK: supple, thyroid normal size, non-tender, without nodularity LYMPH:  no palpable lymphadenopathy in the cervical, axillary or inguinal LUNGS: clear to auscultation and percussion with normal breathing effort HEART: regular rate & rhythm and no murmurs and no lower extremity edema ABDOMEN:abdomen soft, non-tender and normal bowel sounds Musculoskeletal:no cyanosis of digits and no clubbing  PSYCH: alert & oriented x 3 with fluent speech NEURO: no focal motor/sensory deficits  LABORATORY DATA:  I have reviewed the data as listed Recent Results (from the  past 2160 hour(s))  CBC with Differential/Platelet     Status: Abnormal   Collection Time: 11/14/19 11:13 AM  Result Value Ref Range   WBC 13.9 (H) 3.8 - 10.8 Thousand/uL   RBC 4.39 3.80 - 5.10 Million/uL   Hemoglobin 12.9 11.7 - 15.5 g/dL   HCT 38.9 35.0 - 45.0 %   MCV 88.6 80.0 - 100.0 fL   MCH 29.4 27.0 - 33.0 pg   MCHC 33.2 32.0 - 36.0 g/dL   RDW 12.7 11.0 - 15.0 %   Platelets 282 140 - 400 Thousand/uL   MPV 10.5 7.5 - 12.5 fL   Neutro Abs 5,518 1,500 - 7,800 cells/uL   Lymphs Abs 7,589 (H) 850 - 3,900 cells/uL   Absolute Monocytes 542 200 - 950 cells/uL   Eosinophils Absolute 195 15 - 500 cells/uL   Basophils Absolute 56 0 - 200 cells/uL   Neutrophils Relative % 39.7 %   Total Lymphocyte 54.6 %   Monocytes Relative 3.9 %   Eosinophils Relative 1.4 %   Basophils Relative 0.4 %  COMPLETE METABOLIC PANEL WITH GFR     Status: Abnormal   Collection Time: 11/14/19 11:13 AM  Result Value Ref Range   Glucose, Bld 106 (H) 65 - 99 mg/dL    Comment: .            Fasting reference interval . For someone without known diabetes, a glucose value between 100 and 125 mg/dL is consistent with prediabetes and should be confirmed with a follow-up test. .    BUN 18 7 - 25 mg/dL   Creat 0.92 0.60 - 0.93 mg/dL    Comment: For patients >34 years of age, the reference limit for Creatinine is approximately 13% higher for  people identified as African-American. .    GFR, Est Non African American 62 > OR = 60 mL/min/1.60m2   GFR, Est African American 72 > OR = 60 mL/min/1.74m2   BUN/Creatinine Ratio NOT APPLICABLE 6 - 22 (calc)   Sodium 141 135 - 146 mmol/L   Potassium 4.0 3.5 - 5.3 mmol/L   Chloride 104 98 - 110 mmol/L   CO2 27 20 - 32 mmol/L   Calcium 9.6 8.6 - 10.4 mg/dL   Total Protein 6.5 6.1 - 8.1 g/dL   Albumin 4.2 3.6 - 5.1 g/dL   Globulin 2.3 1.9 - 3.7 g/dL (calc)   AG Ratio 1.8 1.0 - 2.5 (calc)   Total Bilirubin 0.5 0.2 - 1.2 mg/dL   Alkaline phosphatase (APISO) 87 37 - 153 U/L   AST 15 10 - 35 U/L   ALT 12 6 - 29 U/L  Urinalysis, Routine w reflex microscopic     Status: None   Collection Time: 11/14/19 11:13 AM  Result Value Ref Range   Color, Urine YELLOW YELLOW   APPearance CLEAR CLEAR   Specific Gravity, Urine 1.011 1.001 - 1.03   pH 5.5 5.0 - 8.0   Glucose, UA NEGATIVE NEGATIVE   Bilirubin Urine NEGATIVE NEGATIVE   Ketones, ur NEGATIVE NEGATIVE   Hgb urine dipstick NEGATIVE NEGATIVE   Protein, ur NEGATIVE NEGATIVE   Nitrite NEGATIVE NEGATIVE   Leukocytes,Ua NEGATIVE NEGATIVE  Anti-DNA antibody, double-stranded     Status: Abnormal   Collection Time: 11/14/19 11:13 AM  Result Value Ref Range   ds DNA Ab 9 (H) IU/mL    Comment:  IU/mL       Interpretation                            < or = 4    Negative                            5-9         Indeterminate                            > or = 10   Positive .   C3 and C4     Status: None   Collection Time: 11/14/19 11:13 AM  Result Value Ref Range   C3 Complement 149 83 - 193 mg/dL   C4 Complement 32 15 - 57 mg/dL  Sedimentation rate     Status: None   Collection Time: 11/14/19 11:13 AM  Result Value Ref Range   Sed Rate 11 0 - 30 mm/h  ANA     Status: None   Collection Time: 11/14/19 11:13 AM  Result Value Ref Range   Anti Nuclear Antibody (ANA) NEGATIVE NEGATIVE    Comment: ANA IFA is a first  line screen for detecting the presence of up to approximately 150 autoantibodies in various autoimmune diseases. A negative ANA IFA result suggests an ANA-associated autoimmune disease is not present at this time, but is not definitive. If there is high clinical suspicion for Sjogren's syndrome, testing for anti-SS-A/Ro antibody should be considered. Anti-Jo-1 antibody should be considered for clinically suspected inflammatory myopathies. . AC-0: Negative . International Consensus on ANA Patterns (https://www.hernandez-brewer.com/) . For additional information, please refer to http://education.[facility].com/faq/FAQ177 (This link is being provided for informational/ educational purposes only.) .   Vitamin B12     Status: Abnormal   Collection Time: 12/03/19  3:57 PM  Result Value Ref Range   Vitamin B-12 177 (L) 180 - 914 pg/mL    Comment: (NOTE) This assay is not validated for testing neonatal or myeloproliferative syndrome specimens for Vitamin B12 levels. Performed at [facility], [address], [place], [place] [address]   Save Smear [facility])     Status: None   Collection Time: 12/03/19  3:57 PM  Result Value Ref Range   Smear Review SMEAR STAINED AND AVAILABLE FOR REVIEW     Comment: Performed at [facility], [address] [place], [place] [address]  Comprehensive metabolic panel     Status: Abnormal   Collection Time: 12/03/19  3:57 PM  Result Value Ref Range   Sodium 138 135 - 145 mmol/L   Potassium 3.5 3.5 - 5.1 mmol/L   Chloride 100 98 - 111 mmol/L   CO2 27 22 - 32 mmol/L   Glucose, Bld 122 (H) 70 - 99 mg/dL    Comment: Glucose reference range applies only to samples taken after fasting for at least 8 hours.   BUN 20 8 - 23 mg/dL   Creatinine, Ser 0.89 0.44 - 1.00 mg/dL   Calcium 9.1 8.9 - 10.3 mg/dL   Total Protein 7.1 6.5 - 8.1 g/dL   Albumin 4.2 3.5 - 5.0 g/dL   AST 20 15 - 41 U/L   ALT 18 0 - 44 U/L   Alkaline Phosphatase 82 38 - 126 U/L    Total Bilirubin 0.5 0.3 - 1.2 mg/dL   GFR calc non Af Amer >60 >60  mL/min   GFR calc Af Amer >60 >60 mL/min   Anion gap 11 5 - 15    Comment: Performed at [facility], [address] [place], [place] [address]  CBC with Differential/Platelet     Status: Abnormal   Collection Time: 12/03/19  3:57 PM  Result Value Ref Range   WBC 15.6 (H) 4.0 - 10.5 K/uL   RBC 4.34 3.87 - 5.11 MIL/uL   Hemoglobin 12.6 12.0 - 15.0 g/dL   HCT 40.0 36.0 - 46.0 %   MCV 92.2 80.0 - 100.0 fL   MCH 29.0 26.0 - 34.0 pg   MCHC 31.5 30.0 - 36.0 g/dL   RDW 12.9 11.5 - 15.5 %   Platelets 262 150 - 400 K/uL   nRBC 0.0 0.0 - 0.2 %   Neutrophils Relative % 43 %   Neutro Abs 6.7 1.7 - 7.7 K/uL   Lymphocytes Relative 49 %   Lymphs Abs 7.7 (H) 0.7 - 4.0 K/uL   Monocytes Relative 6 %   Monocytes Absolute 0.9 0.1 - 1.0 K/uL   Eosinophils Relative 2 %   Eosinophils Absolute 0.2 0.0 - 0.5 K/uL   Basophils Relative 0 %   Basophils Absolute 0.1 0.0 - 0.1 K/uL   WBC Morphology SMUDGE CELLS     Comment: VARIANT LYMPHOCYTES   Immature Granulocytes 0 %   Abs Immature Granulocytes 0.05 0.00 - 0.07 K/uL    Comment: Performed at [facility], [address] [place], [place] [address]  Lactate dehydrogenase     Status: None   Collection Time: 12/03/19  3:57 PM  Result Value Ref Range   LDH 159 98 - 192 U/L    Comment: Performed at [facility], [address] [place], [place] [address]    RADIOGRAPHIC STUDIES: I have personally reviewed the radiological images as listed and agreed with the findings in the report. I have independently examined the patient and elicited history.  I agree with the HPI as documented by my nurse practitioner Wenda Low, FNP.  I have independently formulated my assessment and plan. ASSESSMENT & PLAN:  Leukocytosis 1.  Lymphocytic leukocytosis: -She is being evaluated for elevated white count at the request of Dr. Estanislado Pandy.  Patient has elevated white count with elevated lymphocytes since  January 2019. -Denies fevers, night sweats or weight loss.  Physical exam does not show any lymphadenopathy or splenomegaly. -History of rheumatoid arthritis for many years. -She quit smoking at age 33. -We will repeat her CBC and review her smear.  We will also check flow cytometry to evaluate for any lymphoproliferative disorders.  We will check LDH.  She will come back in 1 to 2 weeks to discuss results.  2.  Rheumatoid arthritis: -This affects mainly small joints of the hands.  Plaquenil did not help. -She is taking Tylenol and NSAIDs.  Has never been treated with disease modifying agents.  3.  Osteopenia: -DEXA scan on 02/07/2018 shows T score -2.3. -We will check vitamin D level.   All questions were answered. The patient knows to call the clinic with any problems, questions or concerns.     Derek Jack, MD 12/03/19 5:24 PM

## 2019-12-03 NOTE — Assessment & Plan Note (Addendum)
1.  Lymphocytic leukocytosis: -She is being evaluated for elevated white count at the request of Dr. Estanislado Pandy.  Patient has elevated white count with elevated lymphocytes since January 2019. -Denies fevers, night sweats or weight loss.  Physical exam does not show any lymphadenopathy or splenomegaly. -History of rheumatoid arthritis for many years. -She quit smoking at age 74. -We will repeat her CBC and review her smear.  We will also check flow cytometry to evaluate for any lymphoproliferative disorders.  We will check LDH.  She will come back in 1 to 2 weeks to discuss results.  2.  Rheumatoid arthritis: -This affects mainly small joints of the hands.  Plaquenil did not help. -She is taking Tylenol and NSAIDs.  Has never been treated with disease modifying agents.  3.  Osteopenia: -DEXA scan on 02/07/2018 shows T score -2.3. -We will check vitamin D level.

## 2019-12-04 LAB — SURGICAL PATHOLOGY

## 2019-12-04 LAB — PATHOLOGIST SMEAR REVIEW

## 2019-12-17 LAB — FLOW CYTOMETRY

## 2019-12-29 ENCOUNTER — Other Ambulatory Visit: Payer: Self-pay

## 2019-12-29 ENCOUNTER — Inpatient Hospital Stay (HOSPITAL_COMMUNITY): Payer: Medicare Other | Attending: Hematology | Admitting: Hematology

## 2019-12-29 VITALS — BP 134/67 | HR 82 | Temp 96.6°F | Resp 18 | Wt 247.0 lb

## 2019-12-29 DIAGNOSIS — C9110 Chronic lymphocytic leukemia of B-cell type not having achieved remission: Secondary | ICD-10-CM | POA: Diagnosis not present

## 2019-12-29 DIAGNOSIS — M069 Rheumatoid arthritis, unspecified: Secondary | ICD-10-CM | POA: Diagnosis not present

## 2019-12-29 DIAGNOSIS — Z87891 Personal history of nicotine dependence: Secondary | ICD-10-CM | POA: Diagnosis not present

## 2019-12-29 DIAGNOSIS — M1990 Unspecified osteoarthritis, unspecified site: Secondary | ICD-10-CM | POA: Diagnosis not present

## 2019-12-29 DIAGNOSIS — E079 Disorder of thyroid, unspecified: Secondary | ICD-10-CM | POA: Diagnosis not present

## 2019-12-29 DIAGNOSIS — M8580 Other specified disorders of bone density and structure, unspecified site: Secondary | ICD-10-CM | POA: Insufficient documentation

## 2019-12-29 DIAGNOSIS — I10 Essential (primary) hypertension: Secondary | ICD-10-CM | POA: Diagnosis not present

## 2019-12-29 DIAGNOSIS — D72820 Lymphocytosis (symptomatic): Secondary | ICD-10-CM | POA: Diagnosis not present

## 2019-12-29 DIAGNOSIS — E538 Deficiency of other specified B group vitamins: Secondary | ICD-10-CM | POA: Insufficient documentation

## 2019-12-29 DIAGNOSIS — Z791 Long term (current) use of non-steroidal anti-inflammatories (NSAID): Secondary | ICD-10-CM | POA: Diagnosis not present

## 2019-12-29 DIAGNOSIS — Z79899 Other long term (current) drug therapy: Secondary | ICD-10-CM | POA: Diagnosis not present

## 2019-12-29 MED ORDER — CYANOCOBALAMIN 1000 MCG/ML IJ SOLN
INTRAMUSCULAR | Status: AC
  Filled 2019-12-29: qty 1

## 2019-12-29 MED ORDER — CYANOCOBALAMIN 1000 MCG/ML IJ SOLN
1000.0000 ug | Freq: Once | INTRAMUSCULAR | Status: AC
  Administered 2019-12-29: 1000 ug via INTRAMUSCULAR

## 2019-12-29 NOTE — Patient Instructions (Signed)
[place] at [facility] Discharge Instructions  You were seen today by Dr. Delton Coombes. He went over your recent lab results. Your test results show that your B12 is low, we will give you a B12 injection today before you leave. Please start taking a daily B12 1mg , you can get these over the counter. Your labs also showed that you have CLL, Chronic lymphocytic leukemia, which is a type of Leukemia, you do not need any treatment at this time. Please call us if you have any changes in your health, fever, night sweats or unintentional weight loss. He will see you back in 3 months for labs and follow up.   Thank you for choosing [place] at [facility] to provide your oncology and hematology care.  To afford each patient quality time with our provider, please arrive at least 15 minutes before your scheduled appointment time.   If you have a lab appointment with the [place] please come in thru the  Main Entrance and check in at the main information desk  You need to re-schedule your appointment should you arrive 10 or more minutes late.  We strive to give you quality time with our providers, and arriving late affects you and other patients whose appointments are after yours.  Also, if you no show three or more times for appointments you may be dismissed from the clinic at the providers discretion.     Again, thank you for choosing [facility].  Our hope is that these requests will decrease the amount of time that you wait before being seen by our physicians.       _____________________________________________________________  Should you have questions after your visit to [facility], please contact our office at [phone] between the hours of 8:00 a.m. and 4:30 p.m.  Voicemails left after 4:00 p.m. will not be returned until the following business day.  For prescription refill requests, have your pharmacy contact our  office and allow 72 hours.    Cancer Center Support Programs:   > Cancer Support Group  2nd Tuesday of the month 1pm-2pm, Journey Room

## 2019-12-29 NOTE — Progress Notes (Signed)
B12 injection given per orders. See MAR   Patient tolerated it well without problems. Vitals stable and discharged home from clinic via wheelchair. Follow up as scheduled.

## 2019-12-29 NOTE — Progress Notes (Signed)
Jacqueline Casey, [place] [address]   CLINIC:  Medical Oncology/Hematology  PCP:  Jacqueline Casey [place] [id number] [phone]   REASON FOR VISIT:  Follow-up for lymphocytic leukocytosis.   CURRENT THERAPY: Observation.  INTERVAL HISTORY:  Jacqueline Casey 74 y.o. female seen for follow-up of lymphocytic leukocytosis.  Denies any fevers, night sweats or weight loss.  Appetite and energy levels are 100%.  No new onset pains reported.  No recurrent infections in the last 6 months to 1 year.  No recent hospitalizations.    REVIEW OF SYSTEMS:  Review of Systems  All other systems reviewed and are negative.    PAST MEDICAL/SURGICAL HISTORY:  Past Medical History:  Diagnosis Date  . Baker's cyst of knee 10/18/2011  . Hypertension   . Osteoarthritis   . Thyroid disease    Past Surgical History:  Procedure Laterality Date  . FOOT SURGERY Right 2017   hammer toe   . HAND SURGERY Left 2014   finger, hand  . SKIN TAG REMOVAL  11/07/2018   stomach and below left eye   . THYROID SURGERY    . TUBAL LIGATION    . VAGINAL HYSTERECTOMY       SOCIAL HISTORY:  Social History   Socioeconomic History  . Marital status: Married    Spouse name: Not on file  . Number of children: 3  . Years of education: 34  . Highest education level: Not on file  Occupational History  . Not on file  Tobacco Use  . Smoking status: Former Smoker    Packs/day: 1.50    Years: 15.00    Pack years: 22.50    Types: Cigarettes    Quit date: 11/14/1999    Years since quitting: 20.1  . Smokeless tobacco: Never Used  Substance and Sexual Activity  . Alcohol use: No  . Drug use: No  . Sexual activity: Not on file  Other Topics Concern  . Not on file  Social History Narrative  . Not on file   Social Determinants of Health   Financial Resource Strain:   . Difficulty of Paying Living Expenses:   Food Insecurity:   . Worried  About Charity fundraiser in the Last Year:   . Arboriculturist in the Last Year:   Transportation Needs:   . Film/video editor (Medical):   Marland Kitchen Lack of Transportation (Non-Medical):   Physical Activity:   . Days of Exercise per Week:   . Minutes of Exercise per Session:   Stress:   . Feeling of Stress :   Social Connections:   . Frequency of Communication with Friends and Family:   . Frequency of Social Gatherings with Friends and Family:   . Attends Religious Services:   . Active Member of Clubs or Organizations:   . Attends Archivist Meetings:   Marland Kitchen Marital Status:   Intimate Partner Violence:   . Fear of Current or Ex-Partner:   . Emotionally Abused:   Marland Kitchen Physically Abused:   . Sexually Abused:     FAMILY HISTORY:  Family History  Problem Relation Age of Onset  . Cancer Other   . Diabetes Other   . Cancer Mother        breast  . COPD Mother   . Leukemia Father   . Osteoporosis Sister   . Asthma Sister   . Dermatomyositis Sister   . COPD Sister   .  Kidney disease Sister   . Cancer Son        lymphoma, colon  . Diabetes Son   . COPD Daughter   . Osteoporosis Daughter   . Rheum arthritis Daughter     CURRENT MEDICATIONS:  Outpatient Encounter Medications as of 12/29/2019  Medication Sig  . hydrochlorothiazide (HYDRODIURIL) 12.5 MG tablet Take 12.5 mg by mouth daily.  . hydrochlorothiazide (HYDRODIURIL) 25 MG tablet Take 12.5 mg by mouth daily.   Marland Kitchen levothyroxine (SYNTHROID, LEVOTHROID) 88 MCG tablet Take 88 mcg by mouth daily.  Marland Kitchen acetaminophen (TYLENOL 8 HOUR ARTHRITIS PAIN) 650 MG CR tablet Take 650 mg by mouth as needed for pain.  Marland Kitchen diclofenac sodium (VOLTAREN) 1 % GEL Apply 3 grams to 3 large joints up to 3 times daily. (Patient not taking: Reported on 12/03/2019)  . ibuprofen (ADVIL,MOTRIN) 200 MG tablet Take 200 mg by mouth every 6 (six) hours as needed.  . triamcinolone cream (KENALOG) 0.1 % APPLY CREAM EXTERNALLY TO AFFECTED AREA TWICE DAILY AS  NEEDED  . [EXPIRED] cyanocobalamin ((VITAMIN B-12)) injection 1,000 mcg    No facility-administered encounter medications on file as of 12/29/2019.    ALLERGIES:  Allergies  Allergen Reactions  . Sulfa Antibiotics      PHYSICAL EXAM:  ECOG Performance status: 1  Vitals:   12/29/19 1529  BP: 134/67  Pulse: 82  Resp: 18  Temp: (!) 96.6 F (35.9 C)  SpO2: 98%   Filed Weights   12/29/19 1529  Weight: 247 lb (112 kg)   Physical Exam Vitals reviewed.  Constitutional:      Appearance: Normal appearance.  Cardiovascular:     Rate and Rhythm: Normal rate and regular rhythm.     Heart sounds: Normal heart sounds.  Pulmonary:     Effort: Pulmonary effort is normal.     Breath sounds: Normal breath sounds.  Abdominal:     Palpations: Abdomen is soft. There is no mass.  Lymphadenopathy:     Cervical: No cervical adenopathy.  Skin:    General: Skin is warm.  Neurological:     General: No focal deficit present.     Mental Status: She is alert and oriented to person, place, and time.  Psychiatric:        Mood and Affect: Mood normal.        Behavior: Behavior normal.    No axillary or inguinal adenopathy.  LABORATORY DATA:  I have reviewed the labs as listed.  CBC    Component Value Date/Time   WBC 15.6 (H) 12/03/2019 1557   RBC 4.34 12/03/2019 1557   HGB 12.6 12/03/2019 1557   HCT 40.0 12/03/2019 1557   PLT 262 12/03/2019 1557   MCV 92.2 12/03/2019 1557   MCH 29.0 12/03/2019 1557   MCHC 31.5 12/03/2019 1557   RDW 12.9 12/03/2019 1557   LYMPHSABS 7.7 (H) 12/03/2019 1557   MONOABS 0.9 12/03/2019 1557   EOSABS 0.2 12/03/2019 1557   BASOSABS 0.1 12/03/2019 1557   CMP Latest Ref Rng & Units 12/03/2019 11/14/2019 05/16/2019  Glucose 70 - 99 mg/dL 122(H) 106(H) 97  BUN 8 - 23 mg/dL 20 18 15   Creatinine 0.44 - 1.00 mg/dL 0.89 0.92 0.93  Sodium 135 - 145 mmol/L 138 141 141  Potassium 3.5 - 5.1 mmol/L 3.5 4.0 3.9  Chloride 98 - 111 mmol/L 100 104 102  CO2 22 - 32  mmol/L 27 27 28   Calcium 8.9 - 10.3 mg/dL 9.1 9.6 9.7  Total Protein 6.5 -  8.1 g/dL 7.1 6.5 6.7  Total Bilirubin 0.3 - 1.2 mg/dL 0.5 0.5 0.6  Alkaline Phos 38 - 126 U/L 82 - -  AST 15 - 41 U/L 20 15 17   ALT 0 - 44 U/L 18 12 12     DIAGNOSTIC IMAGING:  I have reviewed scans.  ASSESSMENT & PLAN:  CLL (chronic lymphocytic leukemia) ([place]) 1.  Clinical stage 0 CLL: -She was evaluated at the request of Dr. Estanislado Pandy for elevated white count, predominantly lymphocytes since January 2019. -Denies any B symptoms.  Physical exam does not show any lymphadenopathy or splenomegaly. -We reviewed results of flow cytometry from 12/03/2019.  Monoclonal B-cell population with coexpression of CD5 comprises 36% of lymphocytes.  Overall the morphology and immunophenotype favor CLL.  However there is a small chance of it being atypical mantle cell lymphoma. -We talked about normal prognosis of CLL.  Treatment indications include recurrent infections, cytopenias, painful lymphadenopathy or massive splenomegaly. -Labs from 12/03/2019 shows normal hemoglobin and platelet count.  LDH is normal.  White count is 15.7. -No treatment indications at this time.  We will see her back in 3 months for follow-up.  If she continues to be stable, we will switch her to 41-month visits.  2.  Rheumatoid arthritis: -Small joints of the hands involved.  Plaquenil did not help. -She is taking Tylenol and NSAIDs.  3.  Osteopenia: -DEXA scan on 02/07/2018 shows T score -2.3. -Vitamin D level was borderline low at 24.  She was told to take vitamin D 1000 units daily.  4.  Vitamin B12 deficiency: -B12 level was low at 177.  She was given B12 injection today.  She was told to start taking B12 1 mg tablet daily.     Orders placed this encounter:  Orders Placed This Encounter  Procedures  . CBC with Differential  . Comprehensive metabolic panel  . Lactate dehydrogenase  . Vitamin B12      Derek Jack, [place] [phone]

## 2019-12-29 NOTE — Assessment & Plan Note (Signed)
1.  Clinical stage 0 CLL: -She was evaluated at the request of Dr. Estanislado Pandy for elevated white count, predominantly lymphocytes since January 2019. -Denies any B symptoms.  Physical exam does not show any lymphadenopathy or splenomegaly. -We reviewed results of flow cytometry from 12/03/2019.  Monoclonal B-cell population with coexpression of CD5 comprises 36% of lymphocytes.  Overall the morphology and immunophenotype favor CLL.  However there is a small chance of it being atypical mantle cell lymphoma. -We talked about normal prognosis of CLL.  Treatment indications include recurrent infections, cytopenias, painful lymphadenopathy or massive splenomegaly. -Labs from 12/03/2019 shows normal hemoglobin and platelet count.  LDH is normal.  White count is 15.7. -No treatment indications at this time.  We will see her back in 3 months for follow-up.  If she continues to be stable, we will switch her to 43-month visits.  2.  Rheumatoid arthritis: -Small joints of the hands involved.  Plaquenil did not help. -She is taking Tylenol and NSAIDs.  3.  Osteopenia: -DEXA scan on 02/07/2018 shows T score -2.3. -Vitamin D level was borderline low at 24.  She was told to take vitamin D 1000 units daily.  4.  Vitamin B12 deficiency: -B12 level was low at 177.  She was given B12 injection today.  She was told to start taking B12 1 mg tablet daily.

## 2020-02-03 DIAGNOSIS — M47812 Spondylosis without myelopathy or radiculopathy, cervical region: Secondary | ICD-10-CM | POA: Diagnosis not present

## 2020-02-03 DIAGNOSIS — M9901 Segmental and somatic dysfunction of cervical region: Secondary | ICD-10-CM | POA: Diagnosis not present

## 2020-02-04 DIAGNOSIS — M9901 Segmental and somatic dysfunction of cervical region: Secondary | ICD-10-CM | POA: Diagnosis not present

## 2020-02-04 DIAGNOSIS — M47812 Spondylosis without myelopathy or radiculopathy, cervical region: Secondary | ICD-10-CM | POA: Diagnosis not present

## 2020-02-06 DIAGNOSIS — M9901 Segmental and somatic dysfunction of cervical region: Secondary | ICD-10-CM | POA: Diagnosis not present

## 2020-02-06 DIAGNOSIS — M47812 Spondylosis without myelopathy or radiculopathy, cervical region: Secondary | ICD-10-CM | POA: Diagnosis not present

## 2020-02-09 DIAGNOSIS — M9901 Segmental and somatic dysfunction of cervical region: Secondary | ICD-10-CM | POA: Diagnosis not present

## 2020-02-09 DIAGNOSIS — M47812 Spondylosis without myelopathy or radiculopathy, cervical region: Secondary | ICD-10-CM | POA: Diagnosis not present

## 2020-03-29 ENCOUNTER — Other Ambulatory Visit: Payer: Self-pay

## 2020-03-29 ENCOUNTER — Inpatient Hospital Stay (HOSPITAL_COMMUNITY): Payer: Medicare Other | Attending: Hematology

## 2020-03-29 DIAGNOSIS — I10 Essential (primary) hypertension: Secondary | ICD-10-CM | POA: Insufficient documentation

## 2020-03-29 DIAGNOSIS — E079 Disorder of thyroid, unspecified: Secondary | ICD-10-CM | POA: Insufficient documentation

## 2020-03-29 DIAGNOSIS — Z79899 Other long term (current) drug therapy: Secondary | ICD-10-CM | POA: Diagnosis not present

## 2020-03-29 DIAGNOSIS — Z8262 Family history of osteoporosis: Secondary | ICD-10-CM | POA: Insufficient documentation

## 2020-03-29 DIAGNOSIS — C9110 Chronic lymphocytic leukemia of B-cell type not having achieved remission: Secondary | ICD-10-CM | POA: Diagnosis not present

## 2020-03-29 DIAGNOSIS — E538 Deficiency of other specified B group vitamins: Secondary | ICD-10-CM | POA: Insufficient documentation

## 2020-03-29 DIAGNOSIS — Z8261 Family history of arthritis: Secondary | ICD-10-CM | POA: Diagnosis not present

## 2020-03-29 DIAGNOSIS — M8580 Other specified disorders of bone density and structure, unspecified site: Secondary | ICD-10-CM | POA: Diagnosis not present

## 2020-03-29 DIAGNOSIS — M069 Rheumatoid arthritis, unspecified: Secondary | ICD-10-CM | POA: Insufficient documentation

## 2020-03-29 DIAGNOSIS — Z87891 Personal history of nicotine dependence: Secondary | ICD-10-CM | POA: Insufficient documentation

## 2020-03-29 DIAGNOSIS — D72820 Lymphocytosis (symptomatic): Secondary | ICD-10-CM

## 2020-03-29 DIAGNOSIS — Z833 Family history of diabetes mellitus: Secondary | ICD-10-CM | POA: Insufficient documentation

## 2020-03-29 LAB — CBC WITH DIFFERENTIAL/PLATELET
Abs Immature Granulocytes: 0.06 10*3/uL (ref 0.00–0.07)
Basophils Absolute: 0 10*3/uL (ref 0.0–0.1)
Basophils Relative: 0 %
Eosinophils Absolute: 0.4 10*3/uL (ref 0.0–0.5)
Eosinophils Relative: 3 %
HCT: 39.1 % (ref 36.0–46.0)
Hemoglobin: 12.2 g/dL (ref 12.0–15.0)
Lymphocytes Relative: 55 %
Lymphs Abs: 7.3 10*3/uL — ABNORMAL HIGH (ref 0.7–4.0)
MCH: 28.6 pg (ref 26.0–34.0)
MCHC: 31.2 g/dL (ref 30.0–36.0)
MCV: 91.6 fL (ref 80.0–100.0)
Monocytes Absolute: 0.5 10*3/uL (ref 0.1–1.0)
Monocytes Relative: 4 %
Neutro Abs: 5.1 10*3/uL (ref 1.7–7.7)
Neutrophils Relative %: 38 %
Platelets: 260 10*3/uL (ref 150–400)
RBC: 4.27 MIL/uL (ref 3.87–5.11)
RDW: 13.3 % (ref 11.5–15.5)
WBC: 13.3 10*3/uL — ABNORMAL HIGH (ref 4.0–10.5)
nRBC: 0 % (ref 0.0–0.2)

## 2020-03-29 LAB — COMPREHENSIVE METABOLIC PANEL
ALT: 15 U/L (ref 0–44)
AST: 18 U/L (ref 15–41)
Albumin: 4.1 g/dL (ref 3.5–5.0)
Alkaline Phosphatase: 82 U/L (ref 38–126)
Anion gap: 8 (ref 5–15)
BUN: 20 mg/dL (ref 8–23)
CO2: 27 mmol/L (ref 22–32)
Calcium: 8.8 mg/dL — ABNORMAL LOW (ref 8.9–10.3)
Chloride: 103 mmol/L (ref 98–111)
Creatinine, Ser: 1.35 mg/dL — ABNORMAL HIGH (ref 0.44–1.00)
GFR calc Af Amer: 45 mL/min — ABNORMAL LOW (ref >60)
GFR calc non Af Amer: 39 mL/min — ABNORMAL LOW (ref >60)
Glucose, Bld: 110 mg/dL — ABNORMAL HIGH (ref 70–99)
Potassium: 3.8 mmol/L (ref 3.5–5.1)
Sodium: 138 mmol/L (ref 135–145)
Total Bilirubin: 0.5 mg/dL (ref 0.3–1.2)
Total Protein: 7.2 g/dL (ref 6.5–8.1)

## 2020-03-29 LAB — LACTATE DEHYDROGENASE: LDH: 163 U/L (ref 98–192)

## 2020-03-29 LAB — VITAMIN B12: Vitamin B-12: 378 pg/mL (ref 180–914)

## 2020-04-05 ENCOUNTER — Other Ambulatory Visit: Payer: Self-pay

## 2020-04-05 ENCOUNTER — Inpatient Hospital Stay (HOSPITAL_COMMUNITY): Payer: Medicare Other | Admitting: Hematology

## 2020-04-05 VITALS — BP 144/74 | HR 105 | Temp 98.4°F | Resp 18 | Wt 247.8 lb

## 2020-04-05 DIAGNOSIS — Z87891 Personal history of nicotine dependence: Secondary | ICD-10-CM | POA: Diagnosis not present

## 2020-04-05 DIAGNOSIS — Z79899 Other long term (current) drug therapy: Secondary | ICD-10-CM | POA: Diagnosis not present

## 2020-04-05 DIAGNOSIS — Z8261 Family history of arthritis: Secondary | ICD-10-CM | POA: Diagnosis not present

## 2020-04-05 DIAGNOSIS — M069 Rheumatoid arthritis, unspecified: Secondary | ICD-10-CM | POA: Diagnosis not present

## 2020-04-05 DIAGNOSIS — M8580 Other specified disorders of bone density and structure, unspecified site: Secondary | ICD-10-CM | POA: Diagnosis not present

## 2020-04-05 DIAGNOSIS — C9110 Chronic lymphocytic leukemia of B-cell type not having achieved remission: Secondary | ICD-10-CM | POA: Diagnosis not present

## 2020-04-05 DIAGNOSIS — Z8262 Family history of osteoporosis: Secondary | ICD-10-CM | POA: Diagnosis not present

## 2020-04-05 DIAGNOSIS — I10 Essential (primary) hypertension: Secondary | ICD-10-CM | POA: Diagnosis not present

## 2020-04-05 DIAGNOSIS — Z833 Family history of diabetes mellitus: Secondary | ICD-10-CM | POA: Diagnosis not present

## 2020-04-05 DIAGNOSIS — E538 Deficiency of other specified B group vitamins: Secondary | ICD-10-CM | POA: Diagnosis not present

## 2020-04-05 DIAGNOSIS — E079 Disorder of thyroid, unspecified: Secondary | ICD-10-CM | POA: Diagnosis not present

## 2020-04-05 NOTE — Progress Notes (Signed)
[place], [place] [address]   CLINIC:  Medical Oncology/Hematology  PCP:  Scherrie Bateman [address] / [place] [address]  [phone]  REASON FOR VISIT:  Follow-up for CLL  PRIOR THERAPY: None  CURRENT THERAPY: Observation  INTERVAL HISTORY:  [name], a 74 y.o. female, returns for routine follow-up for her CLL. Taesha was last seen on 12/29/2019.  Today she reports feeling well. She denies any recent infections, F/C, night sweats, or swellings. She takes Tylenol for her rheumatoid arthritis as needed. She drinks plenty of fluids daily.  She will see her PCP in September.  REVIEW OF SYSTEMS:  Review of Systems  Constitutional: Negative for appetite change and fatigue.  Musculoskeletal: Positive for arthralgias (5/10 bilat knees pain).  All other systems reviewed and are negative.   PAST MEDICAL/SURGICAL HISTORY:  Past Medical History:  Diagnosis Date  . Baker's cyst of knee 10/18/2011  . Hypertension   . Osteoarthritis   . Thyroid disease    Past Surgical History:  Procedure Laterality Date  . FOOT SURGERY Right 2017   hammer toe   . HAND SURGERY Left 2014   finger, hand  . SKIN TAG REMOVAL  11/07/2018   stomach and below left eye   . THYROID SURGERY    . TUBAL LIGATION    . VAGINAL HYSTERECTOMY      SOCIAL HISTORY:  Social History   Socioeconomic History  . Marital status: Married    Spouse name: Not on file  . Number of children: 3  . Years of education: 28  . Highest education level: Not on file  Occupational History  . Not on file  Tobacco Use  . Smoking status: Former Smoker    Packs/day: 1.50    Years: 15.00    Pack years: 22.50    Types: Cigarettes    Quit date: 11/14/1999    Years since quitting: 20.4  . Smokeless tobacco: Never Used  Vaping Use  . Vaping Use: Never used  Substance and Sexual Activity  . Alcohol use: No  . Drug use: No  . Sexual activity:  Not on file  Other Topics Concern  . Not on file  Social History Narrative  . Not on file   Social Determinants of Health   Financial Resource Strain:   . Difficulty of Paying Living Expenses:   Food Insecurity:   . Worried About Charity fundraiser in the Last Year:   . Arboriculturist in the Last Year:   Transportation Needs:   . Film/video editor (Medical):   Marland Kitchen Lack of Transportation (Non-Medical):   Physical Activity:   . Days of Exercise per Week:   . Minutes of Exercise per Session:   Stress:   . Feeling of Stress :   Social Connections:   . Frequency of Communication with Friends and Family:   . Frequency of Social Gatherings with Friends and Family:   . Attends Religious Services:   . Active Member of Clubs or Organizations:   . Attends Archivist Meetings:   Marland Kitchen Marital Status:   Intimate Partner Violence:   . Fear of Current or Ex-Partner:   . Emotionally Abused:   Marland Kitchen Physically Abused:   . Sexually Abused:     FAMILY HISTORY:  Family History  Problem Relation Age of Onset  . Cancer Other   . Diabetes Other   . Cancer Mother  breast  . COPD Mother   . Leukemia Father   . Osteoporosis Sister   . Asthma Sister   . Dermatomyositis Sister   . COPD Sister   . Kidney disease Sister   . Cancer Son        lymphoma, colon  . Diabetes Son   . COPD Daughter   . Osteoporosis Daughter   . Rheum arthritis Daughter     CURRENT MEDICATIONS:  Current Outpatient Medications  Medication Sig Dispense Refill  . acetaminophen (TYLENOL 8 HOUR ARTHRITIS PAIN) 650 MG CR tablet Take 650 mg by mouth as needed for pain.    Marland Kitchen diclofenac sodium (VOLTAREN) 1 % GEL Apply 3 grams to 3 large joints up to 3 times daily. 3 Tube 3  . hydrochlorothiazide (HYDRODIURIL) 12.5 MG tablet Take 12.5 mg by mouth daily.    Marland Kitchen ibuprofen (ADVIL,MOTRIN) 200 MG tablet Take 200 mg by mouth every 6 (six) hours as needed.    Marland Kitchen levothyroxine (SYNTHROID, LEVOTHROID) 88 MCG tablet  Take 88 mcg by mouth daily.    Marland Kitchen triamcinolone cream (KENALOG) 0.1 % APPLY CREAM EXTERNALLY TO AFFECTED AREA TWICE DAILY AS NEEDED  3  . vitamin B-12 (CYANOCOBALAMIN) 500 MCG tablet Take 500 mcg by mouth 2 (two) times daily.     No current facility-administered medications for this visit.    ALLERGIES:  Allergies  Allergen Reactions  . Sulfa Antibiotics     PHYSICAL EXAM:  Performance status (ECOG): 1 - Symptomatic but completely ambulatory  There were no vitals filed for this visit. Wt Readings from Last 3 Encounters:  12/29/19 247 lb (112 kg)  12/03/19 247 lb (112 kg)  11/14/19 247 lb 3.2 oz (112.1 kg)   Physical Exam Vitals reviewed.  Constitutional:      Appearance: Normal appearance. She is obese.  Cardiovascular:     Rate and Rhythm: Normal rate and regular rhythm.     Pulses: Normal pulses.     Heart sounds: Normal heart sounds.  Pulmonary:     Effort: Pulmonary effort is normal.     Breath sounds: Normal breath sounds.  Abdominal:     Palpations: Abdomen is soft. There is no mass.     Tenderness: There is no abdominal tenderness.  Lymphadenopathy:     Cervical: No cervical adenopathy.     Upper Body:     Right upper body: No supraclavicular or axillary adenopathy.     Left upper body: No supraclavicular or axillary adenopathy.  Neurological:     General: No focal deficit present.     Mental Status: She is alert and oriented to person, place, and time.  Psychiatric:        Mood and Affect: Mood normal.        Behavior: Behavior normal.     LABORATORY DATA:  I have reviewed the labs as listed.  CBC Latest Ref Rng & Units 03/29/2020 12/03/2019 11/14/2019  WBC 4.0 - 10.5 K/uL 13.3(H) 15.6(H) 13.9(H)  Hemoglobin 12.0 - 15.0 g/dL 12.2 12.6 12.9  Hematocrit 36 - 46 % 39.1 40.0 38.9  Platelets 150 - 400 K/uL 260 262 282   CMP Latest Ref Rng & Units 03/29/2020 12/03/2019 11/14/2019  Glucose 70 - 99 mg/dL 110(H) 122(H) 106(H)  BUN 8 - 23 mg/dL 20 20 18   Creatinine  0.44 - 1.00 mg/dL 1.35(H) 0.89 0.92  Sodium 135 - 145 mmol/L 138 138 141  Potassium 3.5 - 5.1 mmol/L 3.8 3.5 4.0  Chloride 98 - 111  mmol/L 103 100 104  CO2 22 - 32 mmol/L 27 27 27   Calcium 8.9 - 10.3 mg/dL 8.8(L) 9.1 9.6  Total Protein 6.5 - 8.1 g/dL 7.2 7.1 6.5  Total Bilirubin 0.3 - 1.2 mg/dL 0.5 0.5 0.5  Alkaline Phos 38 - 126 U/L 82 82 -  AST 15 - 41 U/L 18 20 15   ALT 0 - 44 U/L 15 18 12       Component Value Date/Time   RBC 4.27 03/29/2020 1540   MCV 91.6 03/29/2020 1540   MCH 28.6 03/29/2020 1540   MCHC 31.2 03/29/2020 1540   RDW 13.3 03/29/2020 1540   LYMPHSABS 7.3 (H) 03/29/2020 1540   MONOABS 0.5 03/29/2020 1540   EOSABS 0.4 03/29/2020 1540   BASOSABS 0.0 03/29/2020 1540   Lab Results  Component Value Date   LDH 163 03/29/2020   LDH 159 12/03/2019   Lab Results  Component Value Date   VD25OH 24.00 (L) 12/03/2019    DIAGNOSTIC IMAGING:  I have independently reviewed the scans and discussed with the patient. No results found.   ASSESSMENT:  1.  Clinical stage 0 CLL: -She was evaluated at the request of Dr. Estanislado Pandy for elevated white count, predominantly lymphocytes since January 2019. -Denies any B symptoms.  Physical exam does not show any lymphadenopathy or splenomegaly. -We reviewed results of flow cytometry from 12/03/2019.  Monoclonal B-cell population with coexpression of CD5 comprises 36% of lymphocytes.  Overall the morphology and immunophenotype favor CLL.  However there is a small chance of it being atypical mantle cell lymphoma.  2.  Rheumatoid arthritis: -Small joints of the hands involved.  Plaquenil did not help. -She is taking Tylenol and NSAIDs.  3.  Osteopenia: -DEXA scan on 02/07/2018 shows T score -2.3. -Vitamin D level was borderline low at 24.  4.  Vitamin B12 deficiency: -B12 level was low at 177.  She was given B12 injection today.  She was told to start taking B12 1 mg tablet daily.   PLAN:  1.  Clinical stage 0 CLL: -She does  not have any B symptoms or recurrent infections.  CBC shows normal hemoglobin and platelets.  No palpable adenopathy or splenomegaly. -No indication for treatment at this time. -She will come back in 6 months for follow-up with repeat labs.  2.  Rheumatoid arthritis: -Continue Tylenol and NSAIDs.  3.  Osteopenia: -Continue vitamin D 1000 units daily.  4.  Vitamin B12 deficiency: -Vitamin B12 was 378.  Continue B12 1 mg tablet daily.   Orders placed this encounter:  No orders of the defined types were placed in this encounter.    Derek Jack, MD [place] [phone]   I, Milinda Antis, am acting as a scribe for Dr. Sanda Linger.  I, Derek Jack MD, have reviewed the above documentation for accuracy and completeness, and I agree with the above.

## 2020-04-05 NOTE — Patient Instructions (Signed)
[facility] at [facility] Discharge Instructions  You were seen today by Dr. Katragadda. He went over your recent results. Dr. Katragadda will see you back in 6 months for labs and follow up.   Thank you for choosing [facility] at [facility] to provide your oncology and hematology care.  To afford each patient quality time with our provider, please arrive at least 15 minutes before your scheduled appointment time.   If you have a lab appointment with the Cancer Center please come in thru the Main Entrance and check in at the main information desk  You need to re-schedule your appointment should you arrive 10 or more minutes late.  We strive to give you quality time with our providers, and arriving late affects you and other patients whose appointments are after yours.  Also, if you no show three or more times for appointments you may be dismissed from the clinic at the providers discretion.     Again, thank you for choosing [facility].  Our hope is that these requests will decrease the amount of time that you wait before being seen by our physicians.       _____________________________________________________________  Should you have questions after your visit to [facility], please contact our office at [phone] between the hours of 8:00 a.m. and 4:30 p.m.  Voicemails left after 4:00 p.m. will not be returned until the following business day.  For prescription refill requests, have your pharmacy contact our office and allow 72 hours.    Cancer Center Support Programs:   > Cancer Support Group  2nd Tuesday of the month 1pm-2pm, Journey Room    

## 2020-05-04 NOTE — Progress Notes (Signed)
Office Visit Note  Patient: Jacqueline Casey             Date of Birth: 04-Aug-1946           MRN: [id number]             PCP: Jake Samples, PA-C Referring: Jake Samples, [place]* Visit Date: 05/18/2020 Occupation: '@GUAROCC' @  Subjective:  Pain in both knee joints   History of Present Illness: Jacqueline Casey is a 74 y.o. female with history of autoimmune disease and osteoarthritis.    She is not taking any immunosuppressive agents at this time.  She denies any signs or symptoms of an autoimmune disease flare recently.  Patient reports that since her last office visit she was diagnosed with CLL but will not require treatment.  She will continue to have lab work on a regular basis for monitoring.  Patient reports that her fatigue has improved since having a B12 injection recently.  She states that she continues to have intermittent arthralgias and joint stiffness.  She experiences intermittent pain in her right CMC joint in both knee joints.  She notices intermittent warmth and swelling in her right knee as well as a Baker's cyst in the posterior aspect.  She feels as though the cyst has started to reduce in size.  She uses voltaren gel topically as needed for pain relief.  She denies any recent facial rashes but will be following up with her dermatologist for routine appointment.  She denies any oral or nasal ulcerations.  She denies any symptoms of Raynaud's.  She has not had any sicca symptoms.  She denies any enlarged lymph nodes.    Activities of Daily Living:  Patient reports morning stiffness for  A few  minutes.   Patient Reports nocturnal pain.  Difficulty dressing/grooming: Denies Difficulty climbing stairs: Reports Difficulty getting out of chair: Reports Difficulty using hands for taps, buttons, cutlery, and/or writing: Reports  Review of Systems  Constitutional: Negative for fatigue.  HENT: Negative for mouth sores, mouth dryness and nose dryness.   Eyes:  Negative for pain, itching and dryness.  Respiratory: Negative for shortness of breath, wheezing and difficulty breathing.   Cardiovascular: Negative for chest pain and palpitations.  Gastrointestinal: Negative for blood in stool, constipation and diarrhea.  Endocrine: Negative for increased urination.  Genitourinary: Negative for difficulty urinating and painful urination.  Musculoskeletal: Positive for arthralgias, joint pain, joint swelling and morning stiffness. Negative for myalgias, muscle tenderness and myalgias.  Skin: Negative for color change, rash and redness.  Allergic/Immunologic: Negative for susceptible to infections.  Neurological: Negative for dizziness, numbness, headaches, memory loss and weakness.  Hematological: Positive for bruising/bleeding tendency.  Psychiatric/Behavioral: Positive for sleep disturbance. Negative for confusion.    PMFS History:  Patient Active Problem List   Diagnosis Date Noted  . CLL (chronic lymphocytic leukemia) ([place]) 12/29/2019  . Leukocytosis 12/03/2019  . Baker's cyst of knee 10/18/2011  . Knee mass 10/04/2011    Past Medical History:  Diagnosis Date  . Baker's cyst of knee 10/18/2011  . CLL (chronic lymphocytic leukemia) ([place])    per patient   . Hypertension   . Osteoarthritis   . Thyroid disease     Family History  Problem Relation Age of Onset  . Cancer Other   . Diabetes Other   . Cancer Mother        breast  . COPD Mother   . Leukemia Father   . Osteoporosis Sister   .  Asthma Sister   . Dermatomyositis Sister   . COPD Sister   . Kidney disease Sister   . Cancer Son        lymphoma, colon  . Diabetes Son   . COPD Daughter   . Osteoporosis Daughter   . Rheum arthritis Daughter    Past Surgical History:  Procedure Laterality Date  . FOOT SURGERY Right 2017   hammer toe   . HAND SURGERY Left 2014   finger, hand  . SKIN TAG REMOVAL  11/07/2018   stomach and below left eye   . THYROID SURGERY    . TUBAL  LIGATION    . VAGINAL HYSTERECTOMY     Social History   Social History Narrative  . Not on file    There is no immunization history on file for this patient.   Objective: Vital Signs: BP (!) 157/84 (BP Location: Left Arm, Patient Position: Sitting, Cuff Size: Normal)   Pulse 71   Resp 17   Ht 5' 8.5" (1.74 m)   Wt 245 lb (111.1 kg)   BMI 36.71 kg/m    Physical Exam Vitals and nursing note reviewed.  Constitutional:      Appearance: She is well-developed.  HENT:     Head: Normocephalic and atraumatic.  Eyes:     Conjunctiva/sclera: Conjunctivae normal.  Pulmonary:     Effort: Pulmonary effort is normal.  Abdominal:     Palpations: Abdomen is soft.  Musculoskeletal:     Cervical back: Normal range of motion.  Skin:    General: Skin is warm and dry.     Capillary Refill: Capillary refill takes less than 2 seconds.  Neurological:     Mental Status: She is alert and oriented to person, place, and time.  Psychiatric:        Behavior: Behavior normal.      Musculoskeletal Exam: C-spine, thoracic spine, and lumbar spine good ROM.  No midline spinal tenderness.  No SI joint tenderness.  Shoulder joints, elbow joints, wrist joints, MCPs, PIPs, and DIPs good ROM with no synovitis.  PIP and DIP thickening consistent with osteoarthritis of both hands.  CMC joint prominence bilaterally, R>L.  Complete fist formation bilaterally.  Hip joints, knee joints, ankle joints, MTPs, PIPs, and DIPs good ROM with no synovitis.  Warmth of the right knee joint noted.  Baker's cyst was palpable in the popliteal space of the right knee.  Tenderness over the right ankle joint.  PIP and DIP thickening consistent with osteoarthritis of both feet.   CDAI Exam: CDAI Score: -- Patient Global: --; Provider Global: -- Swollen: --; Tender: -- Joint Exam 05/18/2020   No joint exam has been documented for this visit   There is currently no information documented on the homunculus. Go to the  Rheumatology activity and complete the homunculus joint exam.  Investigation: No additional findings.  Imaging: No results found.  Recent Labs: Lab Results  Component Value Date   WBC 13.3 (H) 03/29/2020   HGB 12.2 03/29/2020   PLT 260 03/29/2020   NA 138 03/29/2020   K 3.8 03/29/2020   CL 103 03/29/2020   CO2 27 03/29/2020   GLUCOSE 110 (H) 03/29/2020   BUN 20 03/29/2020   CREATININE 1.35 (H) 03/29/2020   BILITOT 0.5 03/29/2020   ALKPHOS 82 03/29/2020   AST 18 03/29/2020   ALT 15 03/29/2020   PROT 7.2 03/29/2020   ALBUMIN 4.1 03/29/2020   CALCIUM 8.8 (L) 03/29/2020   GFRAA 45 (  L) 03/29/2020    Speciality Comments: PLQ eye exam: 03/06/2018 normal. Dr. Marvel Plan. Follow up in 6 months.  Procedures:  No procedures performed Allergies: Sulfa antibiotics   Assessment / Plan:     Visit Diagnoses: Autoimmune disease ([place]) - ANA 1:160 NH, dsDNA 7, C3/C4 WNL, sed rate WNL: She has not had any signs or symptoms of a systemic autoimmune disease.  She is not taking any immunosuppressive agents at this time.  She previously took Plaquenil but discontinued due to no clinical response as well as increased hair loss.  Lab work from 11/14/2019 was reviewed with the patient today in the office.  ANA was negative, ESR within normal limits, complements within normal limits, double-stranded DNA was 9.  Her double-stranded ENA continues to trend up slightly over time but she has not had any clinical features of active disease.  She has not had any recent facial rashes, symptoms of Raynaud's, oral or nasal ulcerations, enlarged lymph nodes, chest pain, palpitations, or shortness of breath.she experiences intermittent pain in her right [facility] joint as well as both knee joints due to underlying osteoarthritis.  She has warmth, limited extension, and crepitus of the right knee joint on examination today.  She does not want to add any medications or have a cortisone injection at this time.  She plans on  continue to use Voltaren gel topically as needed for pain relief.  We will repeat the following lab work today.  She does not require any immunosuppressive therapy at this time.  She was advised to notify us if she develops any new or worsening symptoms.  She will follow-up in the office in 6 months.- Plan: ANA, Anti-DNA antibody, double-stranded, C3 and C4, Sedimentation rate  High risk medication use - D/c PLQ-inadequate response and SE of hair loss. She does not require immunosuppressive therapy at this time.     Primary osteoarthritis of both hands: She has PIP and DIP thickening consistent with osteoarthritis. She has [place] joint prominence and tenderness bilaterally, R>L.  The importance of joint protection and muscle strengthening were discussed.  She plans on continuing to use her copper arthritis gloves and applying voltaren gel as needed for pain relief.   Synovial cyst of right popliteal space: Palpable but no tender.  She uses voltaren gel topically as needed for symptomatic relief.  She continues to have chronic pain in her right knee joint. She has warmth and slightly limited extension with crepitus on exam today.  She declined x-rays or a cortisone injection at this time.  She plans on continuing to use voltaren gel topically as needed.   Osteopenia of multiple sites:DEXA on 02/07/18: The BMD measured at Femur Neck Left is 0.719 g/cm2 with a T-score of -2.3-osteopenia.  She is overdue to update DEXA.  She has an upcoming appointment with her PCP on 05/27/20 and plans on further discussing a repeat DEXA at that visit.  She has not had any recent falls or fractures.   CLL (chronic lymphocytic leukemia) [facility]): Since her last office visit she was diagnosed with CLL and has been followed closely at [place].  The plan is observation at this time with routine lab work.  Other medical conditions are listed as follows:   Granuloma annulare - She is followed by dermatology  History  of hypertension  History of hypothyroidism    Orders: Orders Placed This Encounter  Procedures  . ANA  . Anti-DNA antibody, double-stranded  . C3 and C4  . Sedimentation  rate   No orders of the defined types were placed in this encounter.     Follow-Up Instructions: Return in about 5 months (around 10/18/2020) for Autoimmune Disease, Osteoarthritis.   Ofilia Neas, PA-C  Note - This record has been created using Dragon software.  Chart creation errors have been sought, but may not always  have been located. Such creation errors do not reflect on  the standard of medical care.

## 2020-05-18 ENCOUNTER — Ambulatory Visit: Payer: Medicare Other | Admitting: Physician Assistant

## 2020-05-18 ENCOUNTER — Other Ambulatory Visit: Payer: Self-pay

## 2020-05-18 ENCOUNTER — Encounter: Payer: Self-pay | Admitting: Physician Assistant

## 2020-05-18 VITALS — BP 157/84 | HR 71 | Resp 17 | Ht 68.5 in | Wt 245.0 lb

## 2020-05-18 DIAGNOSIS — M19042 Primary osteoarthritis, left hand: Secondary | ICD-10-CM

## 2020-05-18 DIAGNOSIS — L920 Granuloma annulare: Secondary | ICD-10-CM

## 2020-05-18 DIAGNOSIS — M19041 Primary osteoarthritis, right hand: Secondary | ICD-10-CM | POA: Diagnosis not present

## 2020-05-18 DIAGNOSIS — Z79899 Other long term (current) drug therapy: Secondary | ICD-10-CM

## 2020-05-18 DIAGNOSIS — C9110 Chronic lymphocytic leukemia of B-cell type not having achieved remission: Secondary | ICD-10-CM

## 2020-05-18 DIAGNOSIS — M8589 Other specified disorders of bone density and structure, multiple sites: Secondary | ICD-10-CM

## 2020-05-18 DIAGNOSIS — M7121 Synovial cyst of popliteal space [Baker], right knee: Secondary | ICD-10-CM | POA: Diagnosis not present

## 2020-05-18 DIAGNOSIS — M359 Systemic involvement of connective tissue, unspecified: Secondary | ICD-10-CM

## 2020-05-18 DIAGNOSIS — Z8679 Personal history of other diseases of the circulatory system: Secondary | ICD-10-CM

## 2020-05-18 DIAGNOSIS — Z8639 Personal history of other endocrine, nutritional and metabolic disease: Secondary | ICD-10-CM

## 2020-05-21 LAB — ANTI-NUCLEAR AB-TITER (ANA TITER): ANA Titer 1: 1:40 {titer} — ABNORMAL HIGH

## 2020-05-21 LAB — ANTI-DNA ANTIBODY, DOUBLE-STRANDED: ds DNA Ab: 13 [IU]/mL — ABNORMAL HIGH

## 2020-05-21 LAB — C3 AND C4
C3 Complement: 153 mg/dL (ref 83–193)
C4 Complement: 34 mg/dL (ref 15–57)

## 2020-05-21 LAB — SEDIMENTATION RATE: Sed Rate: 9 mm/h (ref 0–30)

## 2020-05-21 LAB — ANA: Anti Nuclear Antibody (ANA): POSITIVE — AB

## 2020-05-21 NOTE — Progress Notes (Signed)
Positive ANA, positive double-stranded DNA, double-stranded DNA titer has increased but sed rate and complements are normal.  As patient is asymptomatic we will continue to monitor.  Please advise patient to contact us in case she develops any increased symptoms.

## 2020-05-27 DIAGNOSIS — I10 Essential (primary) hypertension: Secondary | ICD-10-CM | POA: Diagnosis not present

## 2020-05-27 DIAGNOSIS — E039 Hypothyroidism, unspecified: Secondary | ICD-10-CM | POA: Diagnosis not present

## 2020-05-27 DIAGNOSIS — Z0000 Encounter for general adult medical examination without abnormal findings: Secondary | ICD-10-CM | POA: Diagnosis not present

## 2020-05-27 DIAGNOSIS — C9190 Lymphoid leukemia, unspecified not having achieved remission: Secondary | ICD-10-CM | POA: Diagnosis not present

## 2020-06-03 DIAGNOSIS — H6123 Impacted cerumen, bilateral: Secondary | ICD-10-CM | POA: Diagnosis not present

## 2020-06-24 DIAGNOSIS — L821 Other seborrheic keratosis: Secondary | ICD-10-CM | POA: Diagnosis not present

## 2020-06-24 DIAGNOSIS — L920 Granuloma annulare: Secondary | ICD-10-CM | POA: Diagnosis not present

## 2020-06-24 DIAGNOSIS — Z85828 Personal history of other malignant neoplasm of skin: Secondary | ICD-10-CM | POA: Diagnosis not present

## 2020-06-24 DIAGNOSIS — L858 Other specified epidermal thickening: Secondary | ICD-10-CM | POA: Diagnosis not present

## 2020-06-24 DIAGNOSIS — Z08 Encounter for follow-up examination after completed treatment for malignant neoplasm: Secondary | ICD-10-CM | POA: Diagnosis not present

## 2020-10-01 ENCOUNTER — Other Ambulatory Visit (HOSPITAL_COMMUNITY): Payer: Self-pay

## 2020-10-01 DIAGNOSIS — D72820 Lymphocytosis (symptomatic): Secondary | ICD-10-CM

## 2020-10-01 DIAGNOSIS — C9110 Chronic lymphocytic leukemia of B-cell type not having achieved remission: Secondary | ICD-10-CM

## 2020-10-04 ENCOUNTER — Inpatient Hospital Stay (HOSPITAL_COMMUNITY): Payer: Medicare Other | Attending: Hematology

## 2020-10-04 ENCOUNTER — Other Ambulatory Visit: Payer: Self-pay

## 2020-10-04 DIAGNOSIS — I10 Essential (primary) hypertension: Secondary | ICD-10-CM | POA: Insufficient documentation

## 2020-10-04 DIAGNOSIS — G8929 Other chronic pain: Secondary | ICD-10-CM | POA: Insufficient documentation

## 2020-10-04 DIAGNOSIS — D72820 Lymphocytosis (symptomatic): Secondary | ICD-10-CM

## 2020-10-04 DIAGNOSIS — E079 Disorder of thyroid, unspecified: Secondary | ICD-10-CM | POA: Diagnosis not present

## 2020-10-04 DIAGNOSIS — M8580 Other specified disorders of bone density and structure, unspecified site: Secondary | ICD-10-CM | POA: Diagnosis not present

## 2020-10-04 DIAGNOSIS — Z8262 Family history of osteoporosis: Secondary | ICD-10-CM | POA: Insufficient documentation

## 2020-10-04 DIAGNOSIS — C9110 Chronic lymphocytic leukemia of B-cell type not having achieved remission: Secondary | ICD-10-CM | POA: Insufficient documentation

## 2020-10-04 DIAGNOSIS — E538 Deficiency of other specified B group vitamins: Secondary | ICD-10-CM | POA: Diagnosis not present

## 2020-10-04 DIAGNOSIS — Z87891 Personal history of nicotine dependence: Secondary | ICD-10-CM | POA: Insufficient documentation

## 2020-10-04 DIAGNOSIS — Z79899 Other long term (current) drug therapy: Secondary | ICD-10-CM | POA: Insufficient documentation

## 2020-10-04 DIAGNOSIS — Z833 Family history of diabetes mellitus: Secondary | ICD-10-CM | POA: Insufficient documentation

## 2020-10-04 DIAGNOSIS — Z8261 Family history of arthritis: Secondary | ICD-10-CM | POA: Diagnosis not present

## 2020-10-04 DIAGNOSIS — M069 Rheumatoid arthritis, unspecified: Secondary | ICD-10-CM | POA: Insufficient documentation

## 2020-10-04 LAB — CBC WITH DIFFERENTIAL/PLATELET
Abs Immature Granulocytes: 0.05 10*3/uL (ref 0.00–0.07)
Basophils Absolute: 0 10*3/uL (ref 0.0–0.1)
Basophils Relative: 0 %
Eosinophils Absolute: 0.3 10*3/uL (ref 0.0–0.5)
Eosinophils Relative: 2 %
HCT: 39.1 % (ref 36.0–46.0)
Hemoglobin: 12.5 g/dL (ref 12.0–15.0)
Immature Granulocytes: 0 %
Lymphocytes Relative: 51 %
Lymphs Abs: 5.8 10*3/uL — ABNORMAL HIGH (ref 0.7–4.0)
MCH: 29.3 pg (ref 26.0–34.0)
MCHC: 32 g/dL (ref 30.0–36.0)
MCV: 91.8 fL (ref 80.0–100.0)
Monocytes Absolute: 0.5 10*3/uL (ref 0.1–1.0)
Monocytes Relative: 4 %
Neutro Abs: 5 10*3/uL (ref 1.7–7.7)
Neutrophils Relative %: 43 %
Platelets: 270 10*3/uL (ref 150–400)
RBC: 4.26 MIL/uL (ref 3.87–5.11)
RDW: 13.2 % (ref 11.5–15.5)
WBC: 11.6 10*3/uL — ABNORMAL HIGH (ref 4.0–10.5)
nRBC: 0 % (ref 0.0–0.2)

## 2020-10-04 LAB — COMPREHENSIVE METABOLIC PANEL
ALT: 13 U/L (ref 0–44)
AST: 18 U/L (ref 15–41)
Albumin: 4 g/dL (ref 3.5–5.0)
Alkaline Phosphatase: 76 U/L (ref 38–126)
Anion gap: 9 (ref 5–15)
BUN: 15 mg/dL (ref 8–23)
CO2: 28 mmol/L (ref 22–32)
Calcium: 9 mg/dL (ref 8.9–10.3)
Chloride: 100 mmol/L (ref 98–111)
Creatinine, Ser: 0.97 mg/dL (ref 0.44–1.00)
GFR, Estimated: >60 mL/min (ref >60)
Glucose, Bld: 108 mg/dL — ABNORMAL HIGH (ref 70–99)
Potassium: 3.4 mmol/L — ABNORMAL LOW (ref 3.5–5.1)
Sodium: 137 mmol/L (ref 135–145)
Total Bilirubin: 0.6 mg/dL (ref 0.3–1.2)
Total Protein: 6.7 g/dL (ref 6.5–8.1)

## 2020-10-04 LAB — VITAMIN D 25 HYDROXY (VIT D DEFICIENCY, FRACTURES): Vit D, 25-Hydroxy: 38.02 ng/mL (ref 30–100)

## 2020-10-04 LAB — VITAMIN B12: Vitamin B-12: 435 pg/mL (ref 180–914)

## 2020-10-04 LAB — LACTATE DEHYDROGENASE: LDH: 159 U/L (ref 98–192)

## 2020-10-05 NOTE — Progress Notes (Signed)
Office Visit Note  Patient: Jacqueline Casey             Date of Birth: 08-02-1946           MRN: [id number]             PCP: Scherrie Bateman Referring: Jake Samples, [place]* Visit Date: 10/19/2020 Occupation: @GUAROCC @  Subjective:  Joint pain.   History of Present Illness: Jacqueline Casey is a 75 y.o. female with a history of autoimmune disease and osteoarthritis.  She states she continues to have some pain and stiffness in her hands.  She has noticed some swelling over her right wrist joint.  She states she takes over-the-counter medications which help her symptoms.  None of the other joints are painful or hurting.  Activities of Daily Living:  Patient reports morning stiffness for 0 minutes.   Patient Denies nocturnal pain.  Difficulty dressing/grooming: Reports Difficulty climbing stairs: Denies Difficulty getting out of chair: Reports Difficulty using hands for taps, buttons, cutlery, and/or writing: Reports  Review of Systems  Constitutional: Negative for fatigue, night sweats, weight gain and weight loss.  HENT: Positive for nose dryness. Negative for mouth sores, trouble swallowing, trouble swallowing and mouth dryness.   Eyes: Negative for pain, redness, itching, visual disturbance and dryness.  Respiratory: Negative for cough, shortness of breath and difficulty breathing.   Cardiovascular: Negative for chest pain, palpitations, hypertension, irregular heartbeat and swelling in legs/feet.  Gastrointestinal: Negative for blood in stool, constipation and diarrhea.  Endocrine: Negative for increased urination.  Genitourinary: Negative for difficulty urinating and vaginal dryness.  Musculoskeletal: Positive for arthralgias, joint pain and muscle weakness. Negative for joint swelling, myalgias, morning stiffness, muscle tenderness and myalgias.  Skin: Negative for color change, rash, hair loss, redness, skin tightness, ulcers and sensitivity to sunlight.   Allergic/Immunologic: Negative for susceptible to infections.  Neurological: Positive for memory loss and weakness. Negative for dizziness, numbness, headaches and night sweats.  Hematological: Positive for bruising/bleeding tendency. Negative for swollen glands.  Psychiatric/Behavioral: Negative for depressed mood, confusion and sleep disturbance. The patient is not nervous/anxious.     PMFS History:  Patient Active Problem List   Diagnosis Date Noted  . CLL (chronic lymphocytic leukemia) (Jacqueline Casey) 12/29/2019  . Leukocytosis 12/03/2019  . Baker's cyst of knee 10/18/2011  . Knee mass 10/04/2011    Past Medical History:  Diagnosis Date  . Baker's cyst of knee 10/18/2011  . CLL (chronic lymphocytic leukemia) (Jacqueline Casey)    per patient   . Hypertension   . Osteoarthritis   . Thyroid disease     Family History  Problem Relation Age of Onset  . Cancer Other   . Diabetes Other   . Cancer Mother        breast  . COPD Mother   . Leukemia Father   . Osteoporosis Sister   . Asthma Sister   . Dermatomyositis Sister   . COPD Sister   . Kidney disease Sister   . Cancer Son        lymphoma, colon  . Diabetes Son   . COPD Daughter   . Osteoporosis Daughter   . Rheum arthritis Daughter    Past Surgical History:  Procedure Laterality Date  . FOOT SURGERY Right 2017   hammer toe   . HAND SURGERY Left 2014   finger, hand  . SKIN TAG REMOVAL  11/07/2018   stomach and below left eye   . THYROID SURGERY    .  TUBAL LIGATION    . VAGINAL HYSTERECTOMY     Social History   Social History Narrative  . Not on file    There is no immunization history on file for this patient.   Objective: Vital Signs: BP (!) 150/82 (BP Location: Left Arm, Patient Position: Sitting, Cuff Size: Normal)   Pulse 71   Resp 16   Ht 5' 8.5" (1.74 m)   Wt 243 lb 12.8 oz (110.6 kg)   BMI 36.53 kg/m    Physical Exam Vitals and nursing note reviewed.  Constitutional:      Appearance: She is well-developed  and well-nourished.  HENT:     Head: Normocephalic and atraumatic.  Eyes:     Extraocular Movements: EOM normal.     Conjunctiva/sclera: Conjunctivae normal.  Cardiovascular:     Rate and Rhythm: Normal rate and regular rhythm.     Pulses: Intact distal pulses.     Heart sounds: Normal heart sounds.  Pulmonary:     Effort: Pulmonary effort is normal.     Breath sounds: Normal breath sounds.  Abdominal:     General: Bowel sounds are normal.     Palpations: Abdomen is soft.  Musculoskeletal:     Cervical back: Normal range of motion.  Lymphadenopathy:     Cervical: No cervical adenopathy.  Skin:    General: Skin is warm and dry.     Capillary Refill: Capillary refill takes less than 2 seconds.  Neurological:     Mental Status: She is alert and oriented to person, place, and time.  Psychiatric:        Mood and Affect: Mood and affect normal.        Behavior: Behavior normal.      Musculoskeletal Exam: C-spine was in good range of motion.  Shoulder joints, elbow joints and wrist joints with good range of motion.  She has some extensor tenosynovitis of her bilateral wrist joint.  She had ulnar deviation in her MCPs bilaterally but no synovitis was noted.  Hip joints, knee joints, ankles with good range of motion.  There was no tenderness over MTPs.  CDAI Exam: CDAI Score: -- Patient Global: --; Provider Global: -- Swollen: 0 ; Tender: 0  Joint Exam 10/19/2020   No joint exam has been documented for this visit   There is currently no information documented on the homunculus. Go to the Rheumatology activity and complete the homunculus joint exam.  Investigation: No additional findings.  Imaging: No results found.  Recent Labs: Lab Results  Component Value Date   WBC 11.6 (H) 10/04/2020   HGB 12.5 10/04/2020   PLT 270 10/04/2020   NA 137 10/04/2020   K 3.4 (L) 10/04/2020   CL 100 10/04/2020   CO2 28 10/04/2020   GLUCOSE 108 (H) 10/04/2020   BUN 15 10/04/2020    CREATININE 0.97 10/04/2020   BILITOT 0.6 10/04/2020   ALKPHOS 76 10/04/2020   AST 18 10/04/2020   ALT 13 10/04/2020   PROT 6.7 10/04/2020   ALBUMIN 4.0 10/04/2020   CALCIUM 9.0 10/04/2020   GFRAA 45 (L) 03/29/2020    Speciality Comments: PLQ eye exam: 03/06/2018 normal. Dr. Marvel Plan. Follow up in 6 months.  Procedures:  No procedures performed Allergies: Sulfa antibiotics   Assessment / Plan:     Visit Diagnoses: Autoimmune disease ([place]) - ANA 1:160 NH, dsDNA 7, C3/C4 WNL, sed rate WNL, Inflammatory arthritis. -X-rays obtained in 2019 showed MCP narrowing consistent with inflammatory arthritis.  She also  has subcutaneous nodules over her bilateral elbows.  It raises concern of possible seronegative rheumatoid arthritis.  She is trying to Plaquenil in the past but discontinued due to intolerance.  She did not want any other immunosuppressive agents in the past.  She believes her symptoms are well controlled with over-the-counter medications.  I will obtain autoimmune labs today.  I will call her back with the results.  We discussed obtaining x-rays at the follow-up visit.  Plan: Urinalysis, Routine w reflex microscopic, Sedimentation rate, ANA, Anti-DNA antibody, double-stranded, C3 and C4, Sedimentation rate  High risk medication use - D/c PLQ-inadequate response and SE of hair loss. She does not require immunosuppressive therapy at this time.    Primary osteoarthritis of both hands-she has some DIP and PIP prominence and some discomfort.  Joint protection was discussed.  Synovial cyst of right popliteal space  Granuloma annulare - She is followed by dermatology  Osteopenia of multiple sites - DEXA on 02/07/18: The BMD measured at Femur Neck Left is 0.719 g/cm2 with a T-score of -2.3-osteopenia.  I have advised her to get bone density with her PCP.  CLL (chronic lymphocytic leukemia) ([place]) - she was diagnosed with CLL and has been followed closely at [place].  Her last  WBC count was better.  History of hypertension-her blood pressure is elevated.  Have advised her to follow-up closely with her PCP.  History of hypothyroidism  Educated about COVID-19 virus infection-patient does not want to be vaccinated.  She understands the risk of not getting vaccination.  Use of mask, social distancing and hand hygiene was discussed.  Orders: Orders Placed This Encounter  Procedures  . Urinalysis, Routine w reflex microscopic  . Sedimentation rate  . ANA  . Anti-DNA antibody, double-stranded  . C3 and C4  . Sedimentation rate   No orders of the defined types were placed in this encounter.   .  Follow-Up Instructions: Return in about 5 months (around 03/21/2021) for +ANA, OA.  Will call back with the lab results.   Bo Merino, MD  Note - This record has been created using Editor, commissioning.  Chart creation errors have been sought, but may not always  have been located. Such creation errors do not reflect on  the standard of medical care.

## 2020-10-07 NOTE — Progress Notes (Signed)
[place], [place] [address]   CLINIC:  Medical Oncology/Hematology  PCP:  Scherrie Bateman [address] / [place] [address]  [phone]  REASON FOR VISIT:  Follow-up for CLL  PRIOR THERAPY: None  CURRENT THERAPY: Observation  INTERVAL HISTORY:  Jacqueline Casey, a 75 y.o. female, returns for routine follow-up for her CLL. Jacqueline Casey was last seen on 04/05/2020.  She denies any fevers, night sweats or weight loss in the last 6 months.  Denies any infections or hospitalizations or ER visits.  Reports appetite 100%.  Energy levels are 50%.  Chronic pain in her legs is stable.   REVIEW OF SYSTEMS:  Review of Systems  All other systems reviewed and are negative.   PAST MEDICAL/SURGICAL HISTORY:  Past Medical History:  Diagnosis Date  . Baker's cyst of knee 10/18/2011  . CLL (chronic lymphocytic leukemia) ([place])    per patient   . Hypertension   . Osteoarthritis   . Thyroid disease    Past Surgical History:  Procedure Laterality Date  . FOOT SURGERY Right 2017   hammer toe   . HAND SURGERY Left 2014   finger, hand  . SKIN TAG REMOVAL  11/07/2018   stomach and below left eye   . THYROID SURGERY    . TUBAL LIGATION    . VAGINAL HYSTERECTOMY      SOCIAL HISTORY:  Social History   Socioeconomic History  . Marital status: Married    Spouse name: Not on file  . Number of children: 3  . Years of education: 92  . Highest education level: Not on file  Occupational History  . Not on file  Tobacco Use  . Smoking status: Former Smoker    Packs/day: 1.50    Years: 15.00    Pack years: 22.50    Types: Cigarettes    Quit date: 11/14/1999    Years since quitting: 20.9  . Smokeless tobacco: Never Used  Vaping Use  . Vaping Use: Never used  Substance and Sexual Activity  . Alcohol use: No  . Drug use: No  . Sexual activity: Not on file  Other Topics Concern  . Not on file  Social History Narrative  .  Not on file   Social Determinants of Health   Financial Resource Strain: Not on file  Food Insecurity: Not on file  Transportation Needs: Not on file  Physical Activity: Not on file  Stress: Not on file  Social Connections: Not on file  Intimate Partner Violence: Not on file    FAMILY HISTORY:  Family History  Problem Relation Age of Onset  . Cancer Other   . Diabetes Other   . Cancer Mother        breast  . COPD Mother   . Leukemia Father   . Osteoporosis Sister   . Asthma Sister   . Dermatomyositis Sister   . COPD Sister   . Kidney disease Sister   . Cancer Son        lymphoma, colon  . Diabetes Son   . COPD Daughter   . Osteoporosis Daughter   . Rheum arthritis Daughter     CURRENT MEDICATIONS:  Current Outpatient Medications  Medication Sig Dispense Refill  . acetaminophen (TYLENOL 8 HOUR ARTHRITIS PAIN) 650 MG CR tablet Take 650 mg by mouth as needed for pain.    Marland Kitchen diclofenac sodium (VOLTAREN) 1 % GEL Apply 3 grams to 3 large  joints up to 3 times daily. 3 Tube 3  . hydrochlorothiazide (HYDRODIURIL) 12.5 MG tablet Take 12.5 mg by mouth daily.    Marland Kitchen ibuprofen (ADVIL,MOTRIN) 200 MG tablet Take 200 mg by mouth every 6 (six) hours as needed. (Patient not taking: Reported on 05/18/2020)    . levothyroxine (SYNTHROID, LEVOTHROID) 88 MCG tablet Take 88 mcg by mouth daily.    Marland Kitchen triamcinolone cream (KENALOG) 0.1 % APPLY CREAM EXTERNALLY TO AFFECTED AREA TWICE DAILY AS NEEDED  3  . vitamin B-12 (CYANOCOBALAMIN) 500 MCG tablet Take 500 mcg by mouth 2 (two) times daily.     No current facility-administered medications for this visit.    ALLERGIES:  Allergies  Allergen Reactions  . Sulfa Antibiotics     PHYSICAL EXAM:  Performance status (ECOG): 1 - Symptomatic but completely ambulatory  There were no vitals filed for this visit. Wt Readings from Last 3 Encounters:  05/18/20 245 lb (111.1 kg)  04/05/20 247 lb 12.8 oz (112.4 kg)  12/29/19 247 lb (112 kg)   Physical  Exam Vitals reviewed.  Constitutional:      Appearance: Normal appearance.  Cardiovascular:     Rate and Rhythm: Normal rate and regular rhythm.     Heart sounds: Normal heart sounds.  Pulmonary:     Effort: Pulmonary effort is normal.     Breath sounds: Normal breath sounds.  Abdominal:     General: There is no distension.     Palpations: Abdomen is soft. There is no mass.  Lymphadenopathy:     Cervical: No cervical adenopathy.  Skin:    General: Skin is warm.  Neurological:     General: No focal deficit present.     Mental Status: She is alert and oriented to person, place, and time.  Psychiatric:        Mood and Affect: Mood normal.        Behavior: Behavior normal.     LABORATORY DATA:  I have reviewed the labs as listed.  CBC Latest Ref Rng & Units 10/04/2020 03/29/2020 12/03/2019  WBC 4.0 - 10.5 K/uL 11.6(H) 13.3(H) 15.6(H)  Hemoglobin 12.0 - 15.0 g/dL 12.5 12.2 12.6  Hematocrit 36.0 - 46.0 % 39.1 39.1 40.0  Platelets 150 - 400 K/uL 270 260 262   CMP Latest Ref Rng & Units 10/04/2020 03/29/2020 12/03/2019  Glucose 70 - 99 mg/dL 108(H) 110(H) 122(H)  BUN 8 - 23 mg/dL 15 20 20   Creatinine 0.44 - 1.00 mg/dL 0.97 1.35(H) 0.89  Sodium 135 - 145 mmol/L 137 138 138  Potassium 3.5 - 5.1 mmol/L 3.4(L) 3.8 3.5  Chloride 98 - 111 mmol/L 100 103 100  CO2 22 - 32 mmol/L 28 27 27   Calcium 8.9 - 10.3 mg/dL 9.0 8.8(L) 9.1  Total Protein 6.5 - 8.1 g/dL 6.7 7.2 7.1  Total Bilirubin 0.3 - 1.2 mg/dL 0.6 0.5 0.5  Alkaline Phos 38 - 126 U/L 76 82 82  AST 15 - 41 U/L 18 18 20   ALT 0 - 44 U/L 13 15 18       Component Value Date/Time   RBC 4.26 10/04/2020 1348   MCV 91.8 10/04/2020 1348   MCH 29.3 10/04/2020 1348   MCHC 32.0 10/04/2020 1348   RDW 13.2 10/04/2020 1348   LYMPHSABS 5.8 (H) 10/04/2020 1348   MONOABS 0.5 10/04/2020 1348   EOSABS 0.3 10/04/2020 1348   BASOSABS 0.0 10/04/2020 1348   Lab Results  Component Value Date   LDH 159 10/04/2020   LDH  163 03/29/2020   LDH 159  12/03/2019   Lab Results  Component Value Date   VD25OH 38.02 10/04/2020   VD25OH 24.00 (L) 12/03/2019    DIAGNOSTIC IMAGING:  I have independently reviewed the scans and discussed with the patient. No results found.   ASSESSMENT:  1. Clinical stage 0 CLL: -She was evaluated at the request of Dr. Estanislado Pandy for elevated white count, predominantly lymphocytes since January 2019. -Denies any B symptoms. Physical exam does not show any lymphadenopathy or splenomegaly. -We reviewed results of flow cytometry from 12/03/2019. Monoclonal B-cell population with coexpression of CD5 comprises 36% of lymphocytes. Overall the morphology and immunophenotype favor CLL. However there is a small chance of it being atypical mantle cell lymphoma.  2. Rheumatoid arthritis: -Small joints of the hands involved. Plaquenil did not help. -She is taking Tylenol and NSAIDs.  3. Osteopenia: -DEXA scan on 02/07/2018 shows T score -2.3. -Vitamin D level was borderline low at 24.  4. Vitamin B12 deficiency: -B12 level was low at 177. She was given B12 injection today. She was told to start taking B12 1 mg tablet daily.   PLAN:  1. Clinical stage 0 CLL: -She does not have any B symptoms or recurrent infections. -Physical examination did not reveal any palpable adenopathy or splenomegaly. -Labs from 10/04/2020 shows white count improved 11.6 with a increased absolute lymphocyte count consistent with CLL.  LFTs are normal.  LDH was normal. -RTC 6 months with repeat labs and physical exam.  2. Rheumatoid arthritis: -Continue Tylenol and NSAIDs.  Follow-up with Dr. Estanislado Pandy.  3. Osteopenia: -Continue vitamin D supplements.  Vitamin D is 38.  4. Vitamin B12 deficiency: -Continue B12 1 mg tablet daily.  B12 level is 435.  Orders placed this encounter:  No orders of the defined types were placed in this encounter.    Derek Jack, MD [place] [phone]   I, Milinda Antis, am acting as a scribe for Dr. Sanda Linger.  I, Derek Jack MD, have reviewed the above documentation for accuracy and completeness, and I agree with the above.

## 2020-10-11 ENCOUNTER — Other Ambulatory Visit: Payer: Self-pay

## 2020-10-11 ENCOUNTER — Inpatient Hospital Stay (HOSPITAL_COMMUNITY): Payer: Medicare Other | Admitting: Hematology

## 2020-10-11 VITALS — BP 147/71 | HR 84 | Temp 97.2°F | Resp 18 | Wt 243.7 lb

## 2020-10-11 DIAGNOSIS — Z87891 Personal history of nicotine dependence: Secondary | ICD-10-CM | POA: Diagnosis not present

## 2020-10-11 DIAGNOSIS — I10 Essential (primary) hypertension: Secondary | ICD-10-CM | POA: Diagnosis not present

## 2020-10-11 DIAGNOSIS — G8929 Other chronic pain: Secondary | ICD-10-CM | POA: Diagnosis not present

## 2020-10-11 DIAGNOSIS — E538 Deficiency of other specified B group vitamins: Secondary | ICD-10-CM | POA: Diagnosis not present

## 2020-10-11 DIAGNOSIS — M8580 Other specified disorders of bone density and structure, unspecified site: Secondary | ICD-10-CM | POA: Diagnosis not present

## 2020-10-11 DIAGNOSIS — C9110 Chronic lymphocytic leukemia of B-cell type not having achieved remission: Secondary | ICD-10-CM

## 2020-10-11 DIAGNOSIS — Z8261 Family history of arthritis: Secondary | ICD-10-CM | POA: Diagnosis not present

## 2020-10-11 DIAGNOSIS — Z79899 Other long term (current) drug therapy: Secondary | ICD-10-CM | POA: Diagnosis not present

## 2020-10-11 DIAGNOSIS — Z833 Family history of diabetes mellitus: Secondary | ICD-10-CM | POA: Diagnosis not present

## 2020-10-11 DIAGNOSIS — M069 Rheumatoid arthritis, unspecified: Secondary | ICD-10-CM | POA: Diagnosis not present

## 2020-10-11 DIAGNOSIS — Z8262 Family history of osteoporosis: Secondary | ICD-10-CM | POA: Diagnosis not present

## 2020-10-11 DIAGNOSIS — E079 Disorder of thyroid, unspecified: Secondary | ICD-10-CM | POA: Diagnosis not present

## 2020-10-11 NOTE — Patient Instructions (Signed)
[facility] at [facility] Discharge Instructions  You were seen and examined today by Dr. Katragadda. Please follow up as scheduled.    Thank you for choosing [facility] at [facility] to provide your oncology and hematology care.  To afford each patient quality time with our provider, please arrive at least 15 minutes before your scheduled appointment time.   If you have a lab appointment with the Cancer Center please come in thru the Main Entrance and check in at the main information desk.  You need to re-schedule your appointment should you arrive 10 or more minutes late.  We strive to give you quality time with our providers, and arriving late affects you and other patients whose appointments are after yours.  Also, if you no show three or more times for appointments you may be dismissed from the clinic at the providers discretion.     Again, thank you for choosing [facility].  Our hope is that these requests will decrease the amount of time that you wait before being seen by our physicians.       _____________________________________________________________  Should you have questions after your visit to  Cancer Center, please contact our office at [phone] and follow the prompts.  Our office hours are 8:00 a.m. and 4:30 p.m. Monday - Friday.  Please note that voicemails left after 4:00 p.m. may not be returned until the following business day.  We are closed weekends and major holidays.  You do have access to a nurse 24-7, just call the main number to the clinic [phone] and do not press any options, hold on the line and a nurse will answer the phone.    For prescription refill requests, have your pharmacy contact our office and allow 72 hours.    Due to Covid, you will need to wear a mask upon entering the hospital. If you do not have a mask, a mask will be given to you at the Main Entrance upon arrival. For  doctor visits, patients may have 1 support person age 18 or older with them. For treatment visits, patients can not have anyone with them due to social distancing guidelines and our immunocompromised population.      

## 2020-10-19 ENCOUNTER — Ambulatory Visit: Payer: Medicare Other | Admitting: Rheumatology

## 2020-10-19 ENCOUNTER — Encounter: Payer: Self-pay | Admitting: Rheumatology

## 2020-10-19 ENCOUNTER — Other Ambulatory Visit: Payer: Self-pay

## 2020-10-19 VITALS — BP 150/82 | HR 71 | Resp 16 | Ht 68.5 in | Wt 243.8 lb

## 2020-10-19 DIAGNOSIS — Z7189 Other specified counseling: Secondary | ICD-10-CM

## 2020-10-19 DIAGNOSIS — L920 Granuloma annulare: Secondary | ICD-10-CM

## 2020-10-19 DIAGNOSIS — M7121 Synovial cyst of popliteal space [Baker], right knee: Secondary | ICD-10-CM | POA: Diagnosis not present

## 2020-10-19 DIAGNOSIS — Z79899 Other long term (current) drug therapy: Secondary | ICD-10-CM | POA: Diagnosis not present

## 2020-10-19 DIAGNOSIS — M359 Systemic involvement of connective tissue, unspecified: Secondary | ICD-10-CM

## 2020-10-19 DIAGNOSIS — Z8639 Personal history of other endocrine, nutritional and metabolic disease: Secondary | ICD-10-CM | POA: Diagnosis not present

## 2020-10-19 DIAGNOSIS — C9110 Chronic lymphocytic leukemia of B-cell type not having achieved remission: Secondary | ICD-10-CM

## 2020-10-19 DIAGNOSIS — Z8679 Personal history of other diseases of the circulatory system: Secondary | ICD-10-CM

## 2020-10-19 DIAGNOSIS — M8589 Other specified disorders of bone density and structure, multiple sites: Secondary | ICD-10-CM

## 2020-10-19 DIAGNOSIS — M19042 Primary osteoarthritis, left hand: Secondary | ICD-10-CM

## 2020-10-19 DIAGNOSIS — M19041 Primary osteoarthritis, right hand: Secondary | ICD-10-CM | POA: Diagnosis not present

## 2020-10-21 ENCOUNTER — Other Ambulatory Visit (HOSPITAL_COMMUNITY): Payer: Self-pay | Admitting: Family Medicine

## 2020-10-21 DIAGNOSIS — E2839 Other primary ovarian failure: Secondary | ICD-10-CM

## 2020-10-21 LAB — URINALYSIS, ROUTINE W REFLEX MICROSCOPIC
Bilirubin Urine: NEGATIVE
Glucose, UA: NEGATIVE
Hgb urine dipstick: NEGATIVE
Ketones, ur: NEGATIVE
Leukocytes,Ua: NEGATIVE
Nitrite: NEGATIVE
Protein, ur: NEGATIVE
Specific Gravity, Urine: 1.009 (ref 1.001–1.03)
pH: 6.5 (ref 5.0–8.0)

## 2020-10-21 LAB — C3 AND C4
C3 Complement: 150 mg/dL (ref 83–193)
C4 Complement: 34 mg/dL (ref 15–57)

## 2020-10-21 LAB — ANTI-NUCLEAR AB-TITER (ANA TITER): ANA Titer 1: 1:40 {titer} — ABNORMAL HIGH

## 2020-10-21 LAB — ANA: Anti Nuclear Antibody (ANA): POSITIVE — AB

## 2020-10-21 LAB — ANTI-DNA ANTIBODY, DOUBLE-STRANDED: ds DNA Ab: 13 IU/mL — ABNORMAL HIGH

## 2020-10-21 LAB — SEDIMENTATION RATE: Sed Rate: 11 mm/h (ref 0–30)

## 2020-10-28 ENCOUNTER — Other Ambulatory Visit: Payer: Self-pay

## 2020-10-28 ENCOUNTER — Ambulatory Visit (HOSPITAL_COMMUNITY)
Admission: RE | Admit: 2020-10-28 | Discharge: 2020-10-28 | Disposition: A | Discharge status: Home / Self Care | Payer: Medicare Other | Source: Ambulatory Visit | Attending: Family Medicine | Admitting: Family Medicine

## 2020-10-28 DIAGNOSIS — Z780 Asymptomatic menopausal state: Secondary | ICD-10-CM | POA: Diagnosis not present

## 2020-10-28 DIAGNOSIS — M810 Age-related osteoporosis without current pathological fracture: Secondary | ICD-10-CM | POA: Diagnosis not present

## 2020-10-28 DIAGNOSIS — E2839 Other primary ovarian failure: Secondary | ICD-10-CM | POA: Insufficient documentation

## 2020-12-20 DIAGNOSIS — Z1231 Encounter for screening mammogram for malignant neoplasm of breast: Secondary | ICD-10-CM | POA: Diagnosis not present

## 2021-03-08 NOTE — Progress Notes (Signed)
Office Visit Note  Patient: Jacqueline Casey             Date of Birth: 07-30-1946           MRN: [id number]             PCP: Jake Samples, PA-C Referring: Jake Samples, [place]* Visit Date: 03/22/2021 Occupation: '@GUAROCC' @  Subjective:  [place]   History of Present Illness: SHANIGUA GIBB is a 75 y.o. female with history of autoimmune disease and osteoarthritis.  She is not taking any immunosuppressive agents at this time.  She was previously taking Plaquenil but discontinued due to hair loss.  She denies any signs or symptoms of an autoimmune disease flare recently.  She has not had any recent facial rashes.  She denies any oral or nasal ulcerations.  She uses Visine for Clear Eyes when she experiences itching in her eyes but denies any dryness at this time.  She denies any symptoms of Raynaud's.  She has not had any pleuritic chest pain, shortness of breath, or palpitations.  She denies any increased hair loss recently.  She continues to have chronic pain in both hands and both knee joints.  She has ongoing swelling in her right breast and pain in the left [facility] joint today.  She has been trying to go to the pool 2 to 3 days a week for exercise which has been helping with the pain in her knees and feet.  She has had some increased discomfort at the base of the right fifth metatarsal but denies any injury.  She is a previous patient of Dr. Paulla Dolly and plans on following up if her symptoms persist or worsen.  She denies any other new concerns at this time.    Activities of Daily Living:  Patient reports morning stiffness for 2 hours.   Patient Denies nocturnal pain.  Difficulty dressing/grooming: Denies Difficulty climbing stairs: Reports Difficulty getting out of chair: Reports Difficulty using hands for taps, buttons, cutlery, and/or writing: Reports  Review of Systems  Constitutional:  Negative for fatigue.  HENT:  Negative for mouth sores, mouth dryness and nose  dryness.   Eyes:  Positive for itching. Negative for pain, visual disturbance and dryness.  Respiratory:  Negative for cough, hemoptysis, shortness of breath and difficulty breathing.   Cardiovascular:  Negative for chest pain, palpitations, hypertension and swelling in legs/feet.  Gastrointestinal:  Negative for blood in stool, constipation and diarrhea.  Endocrine: Negative for increased urination.  Genitourinary:  Negative for difficulty urinating and painful urination.  Musculoskeletal:  Positive for joint pain, joint pain, joint swelling, myalgias, morning stiffness and myalgias. Negative for muscle weakness and muscle tenderness.  Skin:  Negative for color change, pallor, rash, hair loss, nodules/bumps, redness, skin tightness, ulcers and sensitivity to sunlight.  Allergic/Immunologic: Negative for susceptible to infections.  Neurological:  Negative for dizziness, numbness, headaches, memory loss and weakness.  Hematological:  Positive for bruising/bleeding tendency. Negative for swollen glands.  Psychiatric/Behavioral:  Negative for depressed mood, confusion and sleep disturbance. The patient is not nervous/anxious.    PMFS History:  Patient Active Problem List   Diagnosis Date Noted   CLL (chronic lymphocytic leukemia) ([place]) 12/29/2019   Leukocytosis 12/03/2019   Baker's cyst of knee 10/18/2011   Knee mass 10/04/2011    Past Medical History:  Diagnosis Date   Baker's cyst of knee 10/18/2011   CLL (chronic lymphocytic leukemia) ([place])    per patient    Hypertension  Osteoarthritis    Thyroid disease     Family History  Problem Relation Age of Onset   Cancer Other    Diabetes Other    Cancer Mother        breast   COPD Mother    Leukemia Father    Osteoporosis Sister    Asthma Sister    Dermatomyositis Sister    COPD Sister    Kidney disease Sister    Cancer Son        lymphoma, colon   Diabetes Son    COPD Daughter    Osteoporosis Daughter    Rheum arthritis  Daughter    Past Surgical History:  Procedure Laterality Date   FOOT SURGERY Right 2017   hammer toe    HAND SURGERY Left 2014   finger, hand   SKIN TAG REMOVAL  11/07/2018   stomach and below left eye    THYROID SURGERY     TUBAL LIGATION     VAGINAL HYSTERECTOMY     Social History   Social History Narrative   Not on file    There is no immunization history on file for this patient.   Objective: Vital Signs: BP 136/80 (BP Location: Left Wrist, Patient Position: Sitting, Cuff Size: Normal)   Pulse (!) 103   Ht '5\' 9"'  (1.753 m)   Wt 234 lb 9.6 oz (106.4 kg)   BMI 34.64 kg/m    Physical Exam Vitals and nursing note reviewed.  Constitutional:      Appearance: She is well-developed.  HENT:     Head: Normocephalic and atraumatic.  Eyes:     Conjunctiva/sclera: Conjunctivae normal.  Pulmonary:     Effort: Pulmonary effort is normal.  Abdominal:     Palpations: Abdomen is soft.  Musculoskeletal:     Cervical back: Normal range of motion.  Skin:    General: Skin is warm and dry.     Capillary Refill: Capillary refill takes less than 2 seconds.  Neurological:     Mental Status: She is alert and oriented to person, place, and time.  Psychiatric:        Behavior: Behavior normal.     Musculoskeletal Exam: C-spine, thoracic spine, and lumbar spine good ROM.  No midline spinal tenderness or SI joint tenderness.  Shoulder joints and elbow joints have good ROM.  Radial styloid tenosynovitis of the right wrist noted.  CMC joint prominence and thickening noted. Tenderness over the left CMC joint.  PIP and DIP thickening consistent with OA of both hands.  Complete fist formation bilaterally.  Hip joints good ROM with no discomfort.  No tenderness over trochanteric bursa.  Knee joints have painful ROM with crepitus and mild warmth.  No effusion noted.  Ankle joints have good ROM with no tenderness or joint swelling.  Tenderness and inflammation at the base of the right 5th  metatarsal.  PIP and DIP thickening consistent with osteoarthritis of both feet.   CDAI Exam: CDAI Score: -- Patient Global: --; Provider Global: -- Swollen: --; Tender: -- Joint Exam 03/22/2021   No joint exam has been documented for this visit   There is currently no information documented on the homunculus. Go to the Rheumatology activity and complete the homunculus joint exam.  Investigation: No additional findings.  Imaging: No results found.  Recent Labs: Lab Results  Component Value Date   WBC 11.6 (H) 10/04/2020   HGB 12.5 10/04/2020   PLT 270 10/04/2020   NA 137 10/04/2020  K 3.4 (L) 10/04/2020   CL 100 10/04/2020   CO2 28 10/04/2020   GLUCOSE 108 (H) 10/04/2020   BUN 15 10/04/2020   CREATININE 0.97 10/04/2020   BILITOT 0.6 10/04/2020   ALKPHOS 76 10/04/2020   AST 18 10/04/2020   ALT 13 10/04/2020   PROT 6.7 10/04/2020   ALBUMIN 4.0 10/04/2020   CALCIUM 9.0 10/04/2020   GFRAA 45 (L) 03/29/2020    Speciality Comments: PLQ eye exam: 03/06/2018 normal. Dr. Marvel Plan. Follow up in 6 months.  Procedures:  No procedures performed Allergies: Sulfa antibiotics      Assessment / Plan:     Visit Diagnoses: Autoimmune disease ([place]) - ANA 1:160 NH, dsDNA 7, C3/C4 WNL, sed rate WNL, Inflammatory arthritis. -X-rays obtained in 2019 showed MCP narrowing consistent with inflammatory arthritis: She has not developed any new or worsening symptoms since her last office visit.  She has not taking any immunosuppressive agents at this time and does not want to discuss treatment options.  She previously took Plaquenil but discontinued due to inadequate response and hair loss.  She continues to have chronic pain in both hands and both knee joints.  Radial styloid tenosynovitis of the right wrist was noted on examination today.  Warmth of both knees noted.  She has been exercising in the pool 2 to 3 days a week which has been improving some of her discomfort in her knees and feet.   She has not had any recent rashes.  We discussed the importance of wearing sunscreen SPF greater than 50 on a daily basis especially while working out in the pool.  She has not had any oral or nasal ulcerations.  She is not experiencing any sicca symptoms but uses Visine Clear Eyes for eye itching.  She has not had any symptoms of Raynaud's and no signs of sclerodactyly or digital ulcerations were noted.  She has not had any increased shortness of breath, pleuritic chest pain, or palpitations.  Lab work from 10/30/20 was reviewed today in the office: ANA 1:40NH, ESR 11, dsDNA 13, and complements WNL.  We will recheck the following lab work today.  She was advised to notify us if she develops any new or worsening symptoms or would like to discuss other treatment options.  She will follow-up in the office in 6 months.- Plan: CBC with Differential/Platelet, COMPLETE METABOLIC PANEL WITH GFR, Protein / creatinine ratio, urine, Anti-DNA antibody, double-stranded, C3 and C4, Sedimentation rate  High risk medication use - D/c PLQ-inadequate response and SE of hair loss. She does not require immunosuppressive therapy at this time.    Primary osteoarthritis of both hands: She has PIP and DIP thickening consistent with osteoarthritis of both hands.  CMC joint prominence and thickening noted bilaterally.  She has tenderness over the left [facility] joint on examination today.  We discussed the importance of joint protection and muscle strengthening.  Synovial cyst of right popliteal space: Resolved  Pain in right foot - She presents today with increased discomfort in the right foot which started several months ago.  She has tenderness and inflammation at the base of the right fifth metatarsal.  She has not had any injury prior to the onset of symptoms.  She is a previous patient of Dr. Paulla Dolly but has not scheduled an appointment for further evaluation yet.  X-rays of the right foot were obtained today for further evaluation.   She was strongly encouraged to schedule an appointment with Dr. Paulla Dolly if her symptoms persist or  worsen.  Plan: XR Foot Complete Right  Granuloma annulare - She is followed by dermatology.   Age-related osteoporosis without current pathological fracture - DEXA on 02/07/18: The BMD measured at Femur Neck Left is 0.719 g/cm2 with a T-score of -2.3-osteopenia. She had an updated DEXA on 10/28/20: The BMD measured at Femur Neck Left is 0.697 g/cm2 with a T-score of -2.5.  Discussed DEXA results with the patient and recommended initiating treatment but she declined at this time.  She would like to further discuss treatment options with her PCP.  She was strongly encouraged to remain on a calcium and vitamin D supplement daily.  CLL (chronic lymphocytic leukemia) ([place]) - She was diagnosed with CLL and has been followed closely at [facility].  History of hypertension: BP was 136/80 today in the office.   History of hypothyroidism   Orders: Orders Placed This Encounter  Procedures   XR Foot Complete Right   CBC with Differential/Platelet   COMPLETE METABOLIC PANEL WITH GFR   Protein / creatinine ratio, urine   Anti-DNA antibody, double-stranded   C3 and C4   Sedimentation rate    No orders of the defined types were placed in this encounter.    Follow-Up Instructions: Return in about 6 months (around 09/22/2021) for Autoimmune Disease, Osteoarthritis.   Ofilia Neas, PA-C  Note - This record has been created using Dragon software.  Chart creation errors have been sought, but may not always  have been located. Such creation errors do not reflect on  the standard of medical care.

## 2021-03-22 ENCOUNTER — Ambulatory Visit: Payer: Medicare Other | Admitting: Physician Assistant

## 2021-03-22 ENCOUNTER — Other Ambulatory Visit: Payer: Self-pay

## 2021-03-22 ENCOUNTER — Encounter: Payer: Self-pay | Admitting: Physician Assistant

## 2021-03-22 ENCOUNTER — Ambulatory Visit: Payer: Self-pay

## 2021-03-22 VITALS — BP 136/80 | HR 103 | Ht 69.0 in | Wt 234.6 lb

## 2021-03-22 DIAGNOSIS — C9110 Chronic lymphocytic leukemia of B-cell type not having achieved remission: Secondary | ICD-10-CM

## 2021-03-22 DIAGNOSIS — Z79899 Other long term (current) drug therapy: Secondary | ICD-10-CM

## 2021-03-22 DIAGNOSIS — M810 Age-related osteoporosis without current pathological fracture: Secondary | ICD-10-CM | POA: Diagnosis not present

## 2021-03-22 DIAGNOSIS — M359 Systemic involvement of connective tissue, unspecified: Secondary | ICD-10-CM | POA: Diagnosis not present

## 2021-03-22 DIAGNOSIS — M8589 Other specified disorders of bone density and structure, multiple sites: Secondary | ICD-10-CM

## 2021-03-22 DIAGNOSIS — M19041 Primary osteoarthritis, right hand: Secondary | ICD-10-CM | POA: Diagnosis not present

## 2021-03-22 DIAGNOSIS — M7121 Synovial cyst of popliteal space [Baker], right knee: Secondary | ICD-10-CM | POA: Diagnosis not present

## 2021-03-22 DIAGNOSIS — Z8679 Personal history of other diseases of the circulatory system: Secondary | ICD-10-CM | POA: Diagnosis not present

## 2021-03-22 DIAGNOSIS — Z8639 Personal history of other endocrine, nutritional and metabolic disease: Secondary | ICD-10-CM

## 2021-03-22 DIAGNOSIS — L920 Granuloma annulare: Secondary | ICD-10-CM

## 2021-03-22 DIAGNOSIS — M79671 Pain in right foot: Secondary | ICD-10-CM

## 2021-03-22 DIAGNOSIS — M19042 Primary osteoarthritis, left hand: Secondary | ICD-10-CM

## 2021-03-22 NOTE — Progress Notes (Signed)
Please call the patient to review x-ray results.  Dr. Bennie Dallas recommends following up with Dr. Paulla Dolly.

## 2021-03-23 LAB — PROTEIN / CREATININE RATIO, URINE
Creatinine, Urine: 94 mg/dL (ref 20–275)
Protein/Creat Ratio: 96 mg/g creat (ref 21–161)
Protein/Creatinine Ratio: 0.096 mg/mg creat (ref 0.021–0.161)
Total Protein, Urine: 9 mg/dL (ref 5–24)

## 2021-03-23 LAB — CBC WITH DIFFERENTIAL/PLATELET
Absolute Monocytes: 752 cells/uL (ref 200–950)
Basophils Absolute: 66 cells/uL (ref 0–200)
Basophils Relative: 0.5 %
Eosinophils Absolute: 264 cells/uL (ref 15–500)
Eosinophils Relative: 2 %
HCT: 39 % (ref 35.0–45.0)
Hemoglobin: 12.6 g/dL (ref 11.7–15.5)
Lymphs Abs: 5874 cells/uL — ABNORMAL HIGH (ref 850–3900)
MCH: 28.5 pg (ref 27.0–33.0)
MCHC: 32.3 g/dL (ref 32.0–36.0)
MCV: 88.2 fL (ref 80.0–100.0)
MPV: 9.9 fL (ref 7.5–12.5)
Monocytes Relative: 5.7 %
Neutro Abs: 6244 cells/uL (ref 1500–7800)
Neutrophils Relative %: 47.3 %
Platelets: 277 10*3/uL (ref 140–400)
RBC: 4.42 10*6/uL (ref 3.80–5.10)
RDW: 13.1 % (ref 11.0–15.0)
Total Lymphocyte: 44.5 %
WBC: 13.2 10*3/uL — ABNORMAL HIGH (ref 3.8–10.8)

## 2021-03-23 LAB — COMPLETE METABOLIC PANEL WITH GFR
AG Ratio: 1.9 (calc) (ref 1.0–2.5)
ALT: 14 U/L (ref 6–29)
AST: 23 U/L (ref 10–35)
Albumin: 4 g/dL (ref 3.6–5.1)
Alkaline phosphatase (APISO): 83 U/L (ref 37–153)
BUN/Creatinine Ratio: 13 (calc) (ref 6–22)
BUN: 13 mg/dL (ref 7–25)
CO2: 30 mmol/L (ref 20–32)
Calcium: 9.1 mg/dL (ref 8.6–10.4)
Chloride: 101 mmol/L (ref 98–110)
Creat: 1.02 mg/dL — ABNORMAL HIGH (ref 0.60–1.00)
Globulin: 2.1 g/dL (calc) (ref 1.9–3.7)
Glucose, Bld: 118 mg/dL — ABNORMAL HIGH (ref 65–99)
Potassium: 3.6 mmol/L (ref 3.5–5.3)
Sodium: 138 mmol/L (ref 135–146)
Total Bilirubin: 0.5 mg/dL (ref 0.2–1.2)
Total Protein: 6.1 g/dL (ref 6.1–8.1)
eGFR: 58 mL/min/{1.73_m2} — ABNORMAL LOW (ref >=60)

## 2021-03-23 LAB — SEDIMENTATION RATE: Sed Rate: 9 mm/h (ref 0–30)

## 2021-03-23 LAB — ANTI-DNA ANTIBODY, DOUBLE-STRANDED: ds DNA Ab: 11 IU/mL — ABNORMAL HIGH

## 2021-03-23 LAB — C3 AND C4
C3 Complement: 134 mg/dL (ref 83–193)
C4 Complement: 28 mg/dL (ref 15–57)

## 2021-03-23 NOTE — Progress Notes (Signed)
WBC count remains elevated but stable.  Patient has known history of CLL.  Please forward lab work to oncologist since she has an upcoming appt on 04/27/21.    Creatinine is borderline elevated-1.02.  GFR is slightly low-58. Likely due to HCTZ use, but please advise the patient to avoid NSAID use-ibuprofen is listed on med list.  We will continue to monitor.   Protein creatinine ratio is WNL.  Complements and ESR WNL.  dsDNA remains slightly positive but has improved.  Labs are not consistent with a flare.  No further recommendations at this time.

## 2021-04-27 ENCOUNTER — Other Ambulatory Visit: Payer: Self-pay

## 2021-04-27 ENCOUNTER — Inpatient Hospital Stay (HOSPITAL_COMMUNITY): Payer: Medicare Other | Attending: Hematology

## 2021-04-27 DIAGNOSIS — Z87891 Personal history of nicotine dependence: Secondary | ICD-10-CM | POA: Diagnosis not present

## 2021-04-27 DIAGNOSIS — M8580 Other specified disorders of bone density and structure, unspecified site: Secondary | ICD-10-CM | POA: Insufficient documentation

## 2021-04-27 DIAGNOSIS — E538 Deficiency of other specified B group vitamins: Secondary | ICD-10-CM | POA: Insufficient documentation

## 2021-04-27 DIAGNOSIS — Z79899 Other long term (current) drug therapy: Secondary | ICD-10-CM | POA: Insufficient documentation

## 2021-04-27 DIAGNOSIS — M069 Rheumatoid arthritis, unspecified: Secondary | ICD-10-CM | POA: Insufficient documentation

## 2021-04-27 DIAGNOSIS — C9110 Chronic lymphocytic leukemia of B-cell type not having achieved remission: Secondary | ICD-10-CM | POA: Diagnosis not present

## 2021-04-27 LAB — CBC WITH DIFFERENTIAL/PLATELET
Abs Immature Granulocytes: 0.08 10*3/uL — ABNORMAL HIGH (ref 0.00–0.07)
Basophils Absolute: 0 10*3/uL (ref 0.0–0.1)
Basophils Relative: 0 %
Eosinophils Absolute: 0.2 10*3/uL (ref 0.0–0.5)
Eosinophils Relative: 2 %
HCT: 39.2 % (ref 36.0–46.0)
Hemoglobin: 12.6 g/dL (ref 12.0–15.0)
Immature Granulocytes: 1 %
Lymphocytes Relative: 43 %
Lymphs Abs: 5.9 10*3/uL — ABNORMAL HIGH (ref 0.7–4.0)
MCH: 29 pg (ref 26.0–34.0)
MCHC: 32.1 g/dL (ref 30.0–36.0)
MCV: 90.3 fL (ref 80.0–100.0)
Monocytes Absolute: 0.8 10*3/uL (ref 0.1–1.0)
Monocytes Relative: 6 %
Neutro Abs: 6.6 10*3/uL (ref 1.7–7.7)
Neutrophils Relative %: 48 %
Platelets: 233 10*3/uL (ref 150–400)
RBC: 4.34 MIL/uL (ref 3.87–5.11)
RDW: 14.1 % (ref 11.5–15.5)
WBC: 13.6 10*3/uL — ABNORMAL HIGH (ref 4.0–10.5)
nRBC: 0 % (ref 0.0–0.2)

## 2021-04-27 LAB — COMPREHENSIVE METABOLIC PANEL
ALT: 19 U/L (ref 0–44)
AST: 23 U/L (ref 15–41)
Albumin: 4 g/dL (ref 3.5–5.0)
Alkaline Phosphatase: 82 U/L (ref 38–126)
Anion gap: 9 (ref 5–15)
BUN: 14 mg/dL (ref 8–23)
CO2: 27 mmol/L (ref 22–32)
Calcium: 9 mg/dL (ref 8.9–10.3)
Chloride: 102 mmol/L (ref 98–111)
Creatinine, Ser: 0.81 mg/dL (ref 0.44–1.00)
GFR, Estimated: >60 mL/min (ref >60)
Glucose, Bld: 122 mg/dL — ABNORMAL HIGH (ref 70–99)
Potassium: 3.5 mmol/L (ref 3.5–5.1)
Sodium: 138 mmol/L (ref 135–145)
Total Bilirubin: 0.7 mg/dL (ref 0.3–1.2)
Total Protein: 6.6 g/dL (ref 6.5–8.1)

## 2021-04-27 LAB — LACTATE DEHYDROGENASE: LDH: 168 U/L (ref 98–192)

## 2021-05-03 NOTE — Progress Notes (Signed)
[place], [place] [address]   CLINIC:  Medical Oncology/Hematology  PCP:  Scherrie Bateman [address] / [place] [address] [phone]   REASON FOR VISIT:  Follow-up for CLL  PRIOR THERAPY: none  NGS Results: not done  CURRENT THERAPY: surveillance  INTERVAL HISTORY:  [name], a 75 y.o. female, returns for routine follow-up of her CLL. Jacqueline Casey was last seen on 10/12/2019.   Today she reports feeling good. She denies any recent hospitalizations, infections, fevers, and night sweats. She takes tylenol prn for her RA.   REVIEW OF SYSTEMS:  Review of Systems  Constitutional:  Negative for appetite change and fatigue (75%).  Gastrointestinal:  Positive for constipation.  All other systems reviewed and are negative.  PAST MEDICAL/SURGICAL HISTORY:  Past Medical History:  Diagnosis Date   Baker's cyst of knee 10/18/2011   CLL (chronic lymphocytic leukemia) ([place])    per patient    Hypertension    Osteoarthritis    Thyroid disease    Past Surgical History:  Procedure Laterality Date   FOOT SURGERY Right 2017   hammer toe    HAND SURGERY Left 2014   finger, hand   SKIN TAG REMOVAL  11/07/2018   stomach and below left eye    THYROID SURGERY     TUBAL LIGATION     VAGINAL HYSTERECTOMY      SOCIAL HISTORY:  Social History   Socioeconomic History   Marital status: Married    Spouse name: Not on file   Number of children: 3   Years of education: 12   Highest education level: Not on file  Occupational History   Not on file  Tobacco Use   Smoking status: Former    Packs/day: 1.50    Years: 15.00    Pack years: 22.50    Types: Cigarettes    Quit date: 07/1999    Years since quitting: 21.7   Smokeless tobacco: Never  Vaping Use   Vaping Use: Never used  Substance and Sexual Activity   Alcohol use: No   Drug use: No   Sexual activity: Not on file  Other Topics Concern   Not on  file  Social History Narrative   Not on file   Social Determinants of Health   Financial Resource Strain: Not on file  Food Insecurity: Not on file  Transportation Needs: No Transportation Needs   Lack of Transportation (Medical): No   Lack of Transportation (Non-Medical): No  Physical Activity: Inactive   Days of Exercise per Week: 0 days   Minutes of Exercise per Session: 0 min  Stress: Not on file  Social Connections: Not on file  Intimate Partner Violence: Not At Risk   Fear of Current or Ex-Partner: No   Emotionally Abused: No   Physically Abused: No   Sexually Abused: No    FAMILY HISTORY:  Family History  Problem Relation Age of Onset   Cancer Other    Diabetes Other    Cancer Mother        breast   COPD Mother    Leukemia Father    Osteoporosis Sister    Asthma Sister    Dermatomyositis Sister    COPD Sister    Kidney disease Sister    Cancer Son        lymphoma, colon   Diabetes Son    COPD Daughter    Osteoporosis Daughter    Rheum  arthritis Daughter     CURRENT MEDICATIONS:  Current Outpatient Medications  Medication Sig Dispense Refill   acetaminophen (TYLENOL) 650 MG CR tablet Take 650 mg by mouth as needed for pain.     b complex vitamins capsule Take 1 capsule by mouth daily.     CALCIUM PO Take by mouth daily.     diclofenac sodium (VOLTAREN) 1 % GEL Apply 3 grams to 3 large joints up to 3 times daily. 3 Tube 3   hydrochlorothiazide (HYDRODIURIL) 12.5 MG tablet Take 12.5 mg by mouth daily.     ibuprofen (ADVIL,MOTRIN) 200 MG tablet Take 200 mg by mouth every 6 (six) hours as needed.     levothyroxine (SYNTHROID, LEVOTHROID) 88 MCG tablet Take 88 mcg by mouth daily.     triamcinolone cream (KENALOG) 0.1 % APPLY CREAM EXTERNALLY TO AFFECTED AREA TWICE DAILY AS NEEDED  3   vitamin B-12 (CYANOCOBALAMIN) 500 MCG tablet Take 500 mcg by mouth 2 (two) times daily. (Patient not taking: Reported on 03/22/2021)     VITAMIN D PO Take by mouth daily.     No  current facility-administered medications for this visit.    ALLERGIES:  Allergies  Allergen Reactions   Sulfa Antibiotics     PHYSICAL EXAM:  Performance status (ECOG): 1 - Symptomatic but completely ambulatory  There were no vitals filed for this visit. Wt Readings from Last 3 Encounters:  03/22/21 234 lb 9.6 oz (106.4 kg)  10/19/20 243 lb 12.8 oz (110.6 kg)  10/11/20 243 lb 11.2 oz (110.5 kg)   Physical Exam Vitals reviewed.  Constitutional:      Appearance: Normal appearance.     Comments: In wheelchair  Cardiovascular:     Rate and Rhythm: Normal rate and regular rhythm.     Pulses: Normal pulses.     Heart sounds: Normal heart sounds.  Pulmonary:     Effort: Pulmonary effort is normal.     Breath sounds: Normal breath sounds.  Abdominal:     Palpations: Abdomen is soft. There is no hepatomegaly, splenomegaly or mass.     Tenderness: There is no abdominal tenderness.  Lymphadenopathy:     Cervical: No cervical adenopathy.     Right cervical: No superficial cervical adenopathy.    Left cervical: No superficial cervical adenopathy.     Upper Body:     Right upper body: No supraclavicular, axillary or pectoral adenopathy.     Left upper body: No supraclavicular, axillary or pectoral adenopathy.  Neurological:     General: No focal deficit present.     Mental Status: She is alert and oriented to person, place, and time.  Psychiatric:        Mood and Affect: Mood normal.        Behavior: Behavior normal.     LABORATORY DATA:  I have reviewed the labs as listed.  CBC Latest Ref Rng & Units 04/27/2021 03/22/2021 10/04/2020  WBC 4.0 - 10.5 K/uL 13.6(H) 13.2(H) 11.6(H)  Hemoglobin 12.0 - 15.0 g/dL 12.6 12.6 12.5  Hematocrit 36.0 - 46.0 % 39.2 39.0 39.1  Platelets 150 - 400 K/uL 233 277 270   CMP Latest Ref Rng & Units 04/27/2021 03/22/2021 10/04/2020  Glucose 70 - 99 mg/dL 122(H) 118(H) 108(H)  BUN 8 - 23 mg/dL '14 13 15  '$ Creatinine 0.44 - 1.00 mg/dL 0.81 1.02(H) 0.97   Sodium 135 - 145 mmol/L 138 138 137  Potassium 3.5 - 5.1 mmol/L 3.5 3.6 3.4(L)  Chloride 98 -  111 mmol/L 102 101 100  CO2 22 - 32 mmol/L '27 30 28  '$ Calcium 8.9 - 10.3 mg/dL 9.0 9.1 9.0  Total Protein 6.5 - 8.1 g/dL 6.6 6.1 6.7  Total Bilirubin 0.3 - 1.2 mg/dL 0.7 0.5 0.6  Alkaline Phos 38 - 126 U/L 82 - 76  AST 15 - 41 U/L '23 23 18  '$ ALT 0 - 44 U/L '19 14 13    '$ DIAGNOSTIC IMAGING:  I have independently reviewed the scans and discussed with the patient. No results found.   ASSESSMENT:  1.  Clinical stage 0 CLL: -She was evaluated at the request of Dr. Estanislado Pandy for elevated white count, predominantly lymphocytes since January 2019. -Denies any B symptoms.  Physical exam does not show any lymphadenopathy or splenomegaly. -We reviewed results of flow cytometry from 12/03/2019.  Monoclonal B-cell population with coexpression of CD5 comprises 36% of lymphocytes.  Overall the morphology and immunophenotype favor CLL.  However there is a small chance of it being atypical mantle cell lymphoma.   2.  Rheumatoid arthritis: -Small joints of the hands involved.  Plaquenil did not help. -She is taking Tylenol and NSAIDs.   3.  Osteopenia: -DEXA scan on 02/07/2018 shows T score -2.3. -Vitamin D level was borderline low at 24.   4.  Vitamin B12 deficiency: -B12 level was low at 177.  She was given B12 injection today.  She was told to start taking B12 1 mg tablet daily.   PLAN:  1.  Clinical stage 0 CLL: - She does not report any fevers, night sweats or weight loss.  No recurrent infections. - Physical examination does not reveal any palpable adenopathy or splenomegaly. - Review of labs from 04/27/2021 shows normal LDH 168.  LFTs are normal.  White count is stable around 13.6, differential showing predominantly lymphocytosis.  Hemoglobin and platelet count was completely normal. - RTC 6 months with repeat labs and physical exam.   2.  Rheumatoid arthritis: - Continue Tylenol and NSAIDs.   Follow-up with Dr. Estanislado Pandy.   3.  Osteopenia: - She will continue vitamin D supplements.   4.  Vitamin B12 deficiency: - She will continue B12 tablet daily.   Orders placed this encounter:  No orders of the defined types were placed in this encounter.    Derek Jack, MD [place] [phone]   I, Thana Ates, am acting as a scribe for Dr. Derek Jack.  I, Derek Jack MD, have reviewed the above documentation for accuracy and completeness, and I agree with the above.

## 2021-05-04 ENCOUNTER — Inpatient Hospital Stay (HOSPITAL_COMMUNITY): Payer: Medicare Other | Admitting: Hematology

## 2021-05-04 ENCOUNTER — Other Ambulatory Visit: Payer: Self-pay

## 2021-05-04 DIAGNOSIS — E538 Deficiency of other specified B group vitamins: Secondary | ICD-10-CM | POA: Diagnosis not present

## 2021-05-04 DIAGNOSIS — C9110 Chronic lymphocytic leukemia of B-cell type not having achieved remission: Secondary | ICD-10-CM

## 2021-05-04 DIAGNOSIS — M069 Rheumatoid arthritis, unspecified: Secondary | ICD-10-CM | POA: Diagnosis not present

## 2021-05-04 DIAGNOSIS — Z79899 Other long term (current) drug therapy: Secondary | ICD-10-CM | POA: Diagnosis not present

## 2021-05-04 DIAGNOSIS — M8580 Other specified disorders of bone density and structure, unspecified site: Secondary | ICD-10-CM | POA: Diagnosis not present

## 2021-05-04 DIAGNOSIS — Z87891 Personal history of nicotine dependence: Secondary | ICD-10-CM | POA: Diagnosis not present

## 2021-05-04 NOTE — Patient Instructions (Addendum)
[facility] at [facility] Discharge Instructions  You were seen today by Dr. Katragadda. He went over your recent results. Dr. Katragadda will see you back in 6 months for labs and follow up.   Thank you for choosing [facility] at [facility] to provide your oncology and hematology care.  To afford each patient quality time with our provider, please arrive at least 15 minutes before your scheduled appointment time.   If you have a lab appointment with the Cancer Center please come in thru the Main Entrance and check in at the main information desk  You need to re-schedule your appointment should you arrive 10 or more minutes late.  We strive to give you quality time with our providers, and arriving late affects you and other patients whose appointments are after yours.  Also, if you no show three or more times for appointments you may be dismissed from the clinic at the providers discretion.     Again, thank you for choosing [facility].  Our hope is that these requests will decrease the amount of time that you wait before being seen by our physicians.       _____________________________________________________________  Should you have questions after your visit to [facility], please contact our office at [phone] between the hours of 8:00 a.m. and 4:30 p.m.  Voicemails left after 4:00 p.m. will not be returned until the following business day.  For prescription refill requests, have your pharmacy contact our office and allow 72 hours.    Cancer Center Support Programs:   > Cancer Support Group  2nd Tuesday of the month 1pm-2pm, Journey Room    

## 2021-05-06 DIAGNOSIS — U071 COVID-19: Secondary | ICD-10-CM | POA: Diagnosis not present

## 2021-06-10 DIAGNOSIS — I10 Essential (primary) hypertension: Secondary | ICD-10-CM | POA: Diagnosis not present

## 2021-06-10 DIAGNOSIS — E063 Autoimmune thyroiditis: Secondary | ICD-10-CM | POA: Diagnosis not present

## 2021-06-10 DIAGNOSIS — E039 Hypothyroidism, unspecified: Secondary | ICD-10-CM | POA: Diagnosis not present

## 2021-06-10 DIAGNOSIS — Z13220 Encounter for screening for lipoid disorders: Secondary | ICD-10-CM | POA: Diagnosis not present

## 2021-06-10 DIAGNOSIS — J302 Other seasonal allergic rhinitis: Secondary | ICD-10-CM | POA: Diagnosis not present

## 2021-06-10 DIAGNOSIS — Z0000 Encounter for general adult medical examination without abnormal findings: Secondary | ICD-10-CM | POA: Diagnosis not present

## 2021-06-10 DIAGNOSIS — M069 Rheumatoid arthritis, unspecified: Secondary | ICD-10-CM | POA: Diagnosis not present

## 2021-06-10 DIAGNOSIS — Z0001 Encounter for general adult medical examination with abnormal findings: Secondary | ICD-10-CM | POA: Diagnosis not present

## 2021-06-30 DIAGNOSIS — M47812 Spondylosis without myelopathy or radiculopathy, cervical region: Secondary | ICD-10-CM | POA: Diagnosis not present

## 2021-06-30 DIAGNOSIS — M9901 Segmental and somatic dysfunction of cervical region: Secondary | ICD-10-CM | POA: Diagnosis not present

## 2021-07-04 DIAGNOSIS — M47812 Spondylosis without myelopathy or radiculopathy, cervical region: Secondary | ICD-10-CM | POA: Diagnosis not present

## 2021-07-04 DIAGNOSIS — M9901 Segmental and somatic dysfunction of cervical region: Secondary | ICD-10-CM | POA: Diagnosis not present

## 2021-07-07 DIAGNOSIS — M9901 Segmental and somatic dysfunction of cervical region: Secondary | ICD-10-CM | POA: Diagnosis not present

## 2021-07-07 DIAGNOSIS — M47812 Spondylosis without myelopathy or radiculopathy, cervical region: Secondary | ICD-10-CM | POA: Diagnosis not present

## 2021-07-11 DIAGNOSIS — M47812 Spondylosis without myelopathy or radiculopathy, cervical region: Secondary | ICD-10-CM | POA: Diagnosis not present

## 2021-07-11 DIAGNOSIS — M9901 Segmental and somatic dysfunction of cervical region: Secondary | ICD-10-CM | POA: Diagnosis not present

## 2021-07-29 DIAGNOSIS — M069 Rheumatoid arthritis, unspecified: Secondary | ICD-10-CM | POA: Diagnosis not present

## 2021-07-29 DIAGNOSIS — C9190 Lymphoid leukemia, unspecified not having achieved remission: Secondary | ICD-10-CM | POA: Diagnosis not present

## 2021-07-29 DIAGNOSIS — I10 Essential (primary) hypertension: Secondary | ICD-10-CM | POA: Diagnosis not present

## 2021-07-29 DIAGNOSIS — E039 Hypothyroidism, unspecified: Secondary | ICD-10-CM | POA: Diagnosis not present

## 2021-09-15 NOTE — Progress Notes (Signed)
° °Office Visit Note ° °Patient: Jacqueline Casey             °Date of Birth: 09/12/1945           °MRN: [id number]             °PCP: Jackson, Samantha J, PA-C °Referring: Jackson, Samantha J, PA* °Visit Date: 09/27/2021 °Occupation: @GUAROCC@ ° °Subjective:  °Pain in both knees  ° °History of Present Illness: Jacqueline Casey is a 76 y.o. female with history of autoimmune disease, osteoarthritis, and osteoporosis. She is not currently taking any immunosuppressive agents.  She reports that has been experiencing increased pain and swelling in both knee joints over the past several months.  She return to water aerobics on Monday for the first time since the COVID-19 pandemic.  She plans on trying to go to water aerobics 3 days a week.  She would also like to try to work on weight loss and lower extremity muscle strengthening to try to improve her knee joint pain.  She has been taking Tylenol as needed for pain relief.  She continues to experience some pain and stiffness in both hands especially her right hand.  She wears arthritis compression gloves on a regular basis.  She has not had any symptoms of Raynaud's recently.  She denies any recent rashes, hair loss, or photosensitivity.  She has not had any oral or nasal ulcerations.  She does have some mouth dryness especially first thing in the morning but has not had any eye dryness.  She denies any swollen lymph nodes, increased fatigue, or fevers.  She has not had any shortness of breath, pleuritic chest pain, or palpitations. °She denies any recent falls or fractures.  She continues to take a calcium and vitamin D supplement on a daily basis. ° °Activities of Daily Living:  °Patient reports morning stiffness for 1 hour.   °Patient Reports nocturnal pain.  °Difficulty dressing/grooming: Denies °Difficulty climbing stairs: Denies °Difficulty getting out of chair: Reports °Difficulty using hands for taps, buttons, cutlery, and/or writing: Denies ° °Review of Systems   °Constitutional:  Negative for fatigue.  °HENT:  Positive for mouth dryness. Negative for mouth sores and nose dryness.   °Eyes:  Negative for pain, visual disturbance and dryness.  °Respiratory:  Negative for cough, hemoptysis, shortness of breath and difficulty breathing.   °Cardiovascular:  Positive for swelling in legs/feet. Negative for chest pain, palpitations and hypertension.  °Gastrointestinal:  Negative for blood in stool, constipation and diarrhea.  °Endocrine: Positive for increased urination.  °Genitourinary:  Negative for difficulty urinating and painful urination.  °Musculoskeletal:  Positive for joint pain, joint pain, joint swelling and morning stiffness. Negative for myalgias, muscle weakness, muscle tenderness and myalgias.  °Skin:  Negative for color change, pallor, rash, hair loss, nodules/bumps, skin tightness, ulcers and sensitivity to sunlight.  °Allergic/Immunologic: Negative for susceptible to infections.  °Neurological:  Positive for weakness. Negative for dizziness, numbness and headaches.  °Hematological:  Positive for bruising/bleeding tendency. Negative for swollen glands.  °Psychiatric/Behavioral:  Negative for depressed mood and sleep disturbance. The patient is not nervous/anxious.   ° °PMFS History:  °Patient Active Problem List  ° Diagnosis Date Noted  ° CLL (chronic lymphocytic leukemia) ([facility]) 12/29/2019  ° Leukocytosis 12/03/2019  ° Baker's cyst of knee 10/18/2011  ° Knee mass 10/04/2011  °  °Past Medical History:  °Diagnosis Date  ° Baker's cyst of knee 10/18/2011  ° CLL (chronic lymphocytic leukemia) ([facility])   ° per patient   °   Hypertension   ° Osteoarthritis   ° Thyroid disease   °  °Family History  °Problem Relation Age of Onset  ° Cancer Other   ° Diabetes Other   ° Cancer Mother   °     breast  ° COPD Mother   ° Leukemia Father   ° Osteoporosis Sister   ° Asthma Sister   ° Dermatomyositis Sister   ° COPD Sister   ° Kidney disease Sister   ° Cancer Son   °     lymphoma, colon   ° Diabetes Son   ° COPD Daughter   ° Osteoporosis Daughter   ° Rheum arthritis Daughter   ° °Past Surgical History:  °Procedure Laterality Date  ° FOOT SURGERY Right 2017  ° hammer toe   ° HAND SURGERY Left 2014  ° finger, hand  ° SKIN TAG REMOVAL  11/07/2018  ° stomach and below left eye   ° THYROID SURGERY    ° TUBAL LIGATION    ° VAGINAL HYSTERECTOMY    ° °Social History  ° °Social History Narrative  ° Not on file  ° ° °There is no immunization history on file for this patient.  ° °Objective: °Vital Signs: BP (!) 155/80 (BP Location: Left Arm, Patient Position: Sitting, Cuff Size: Normal)    Pulse 96    Resp 17    Ht 5' 8.5" (1.74 m)    Wt 243 lb (110.2 kg)    BMI 36.41 kg/m²   ° °Physical Exam °Vitals and nursing note reviewed.  °Constitutional:   °   Appearance: She is well-developed.  °HENT:  °   Head: Normocephalic and atraumatic.  °Eyes:  °   Conjunctiva/sclera: Conjunctivae normal.  °Cardiovascular:  °   Rate and Rhythm: Normal rate and regular rhythm.  °   Heart sounds: Normal heart sounds.  °Pulmonary:  °   Effort: Pulmonary effort is normal.  °   Breath sounds: Normal breath sounds.  °Abdominal:  °   General: Bowel sounds are normal.  °   Palpations: Abdomen is soft.  °Musculoskeletal:  °   Cervical back: Normal range of motion.  °Lymphadenopathy:  °   Cervical: No cervical adenopathy.  °Skin: °   General: Skin is warm and dry.  °   Capillary Refill: Capillary refill takes less than 2 seconds.  °   Comments: Mild facial erythema consistent with malar rash was noted  °Neurological:  °   Mental Status: She is alert and oriented to person, place, and time.  °Psychiatric:     °   Behavior: Behavior normal.  °  ° °Musculoskeletal Exam: C-spine, thoracic spine, and lumbar spine good ROM.  No midline spinal tenderness. Shoulder joints, elbow joints, wrist joints, MCPs, PIPs, and DIPs good ROM with no synovitis. CMC joint thickening bilaterally.  PIP and DIP thickening consistent with osteoarthritis of both  hands. Hip joints have good ROM with no discomfort.  Painful ROM of both knees.  Baker's cyst palpable behind both knees. Ankle joints good ROM with no synovitis.  ° °CDAI Exam: °CDAI Score: -- °Patient Global: --; Provider Global: -- °Swollen: --; Tender: -- °Joint Exam 09/27/2021  ° °No joint exam has been documented for this visit  ° °There is currently no information documented on the homunculus. Go to the Rheumatology activity and complete the homunculus joint exam. ° °Investigation: °No additional findings. ° °Imaging: °No results found. ° °Recent Labs: °Lab Results  °Component Value Date  ° WBC   13.6 (H) 04/27/2021   HGB 12.6 04/27/2021   PLT 233 04/27/2021   NA 138 04/27/2021   K 3.5 04/27/2021   CL 102 04/27/2021   CO2 27 04/27/2021   GLUCOSE 122 (H) 04/27/2021   BUN 14 04/27/2021   CREATININE 0.81 04/27/2021   BILITOT 0.7 04/27/2021   ALKPHOS 82 04/27/2021   AST 23 04/27/2021   ALT 19 04/27/2021   PROT 6.6 04/27/2021   ALBUMIN 4.0 04/27/2021   CALCIUM 9.0 04/27/2021   GFRAA 45 (L) 03/29/2020    Speciality Comments: PLQ eye exam: 03/06/2018 normal. Dr. Marvel Plan. Follow up in 6 months.  Procedures:  No procedures performed Allergies: Sulfa antibiotics   Assessment / Plan:     Visit Diagnoses: Autoimmune disease ([place]) - ANA 1:160 NH, dsDNA 7, C3/C4 WNL, sed rate WNL, Inflammatory arthritis. X-rays obtained in 2019 showed MCP narrowing consistent with inflammatory arthritis: She has not had any signs or symptoms of a flare since her last office visit.  She is not currently taking any immunosuppressive agents and does not require immunosuppression at this time.  She previously tried Plaquenil with an inadequate response as well as hair loss. She continues to have chronic pain in both knee joints but no warmth or effusion was noted on examination today.  She also has intermittent pain and stiffness in both hands but no synovitis was noted.  She wears arthritis compression gloves as  needed.  She has restarted going to water aerobics and plans on working on weight loss. A mild malar rash was noted on examination today.  She has not had any other new rashes.  No photosensitivity.  She has not had any symptoms of Raynaud's.  No digital ulcerations or signs of gangrene were noted.  No cervical lymphadenopathy was noted on exam.  Her lungs were clear to auscultation.  She has not had any shortness of breath, pleuritic chest pain, or palpitations.  She has not had any recent hair loss and no signs of alopecia were noted on examination today.  She has not developed any new or worsening symptoms since her last office visit. Lab work from 03/22/21 was reviewed with the patient today in the office: Double-stranded DNA 11, complements within normal limits, ESR within normal limits, protein creatinine ratio was normal.  The following lab work will be updated today. She does not require immunosuppressive therapy at this time.  Discussed signs and symptoms to monitor for closely.  She was advised to notify us if she develops any signs or symptoms of a flare.  She will follow-up in the office in 6 months. - Plan: ANA, Urinalysis, Routine w reflex microscopic, CBC with Differential/Platelet, COMPLETE METABOLIC PANEL WITH GFR, Anti-DNA antibody, double-stranded, C3 and C4, Sedimentation rate  High risk medication use - D/c PLQ-inadequate response and SE of hair loss. She does not require immunosuppressive therapy at this time.   - Plan: CBC with Differential/Platelet, COMPLETE METABOLIC PANEL WITH GFR  Primary osteoarthritis of both hands: She has PIP and DIP thickening consistent with osteoarthritis of both hands.  CMC joint prominence noted bilaterally.  She experiences intermittent pain in her right CMC joint.  She wears arthritis compression gloves as needed.  Discussed the importance of joint protection and muscle strengthening.    Synovial cyst of right popliteal space: Unchanged.  She continues  to have chronic pain in the right knee joint.  No warmth or effusion was noted on examination today.  She is not experiencing any mechanical symptoms.  She has not had any recent falls.  Discussed the importance of lower extremity muscle strengthening.  She has returned to water aerobics and plans on going at least 3 days a week.  She also would like to try to work on weight loss.  Synovial cyst of left knee: Baker's cyst was palpable in the popliteal space of the left knee.  Good range of motion of the left knee joint noted.  No warmth or effusion was noted.  Granuloma annulare - She is followed by dermatology.   Age-related osteoporosis without current pathological fracture - DEXA on 10/28/20: The BMD measured at Femur Neck Left is 0.697 g/cm2 with a T-score of -2.5.  She is taking a calcium vitamin D supplement daily.  She has declined treatment in the past.  She has not had any recent falls or fractures.  Her next bone density will be due in March 2024.  Other medical conditions are listed as follows:  CLL (chronic lymphocytic leukemia) ([place]) - She was diagnosed with CLL and has been followed closely at [facility].  History of hypertension  History of hypothyroidism  Orders: Orders Placed This Encounter  Procedures   ANA   Urinalysis, Routine w reflex microscopic   CBC with Differential/Platelet   COMPLETE METABOLIC PANEL WITH GFR   Anti-DNA antibody, double-stranded   C3 and C4   Sedimentation rate   No orders of the defined types were placed in this encounter.     Follow-Up Instructions: Return in about 6 months (around 03/27/2022) for Autoimmune Disease, Osteoarthritis, Osteoporosis.   Ofilia Neas, PA-C  Note - This record has been created using Dragon software.  Chart creation errors have been sought, but may not always  have been located. Such creation errors do not reflect on  the standard of medical care.

## 2021-09-27 ENCOUNTER — Other Ambulatory Visit: Payer: Self-pay

## 2021-09-27 ENCOUNTER — Ambulatory Visit (INDEPENDENT_AMBULATORY_CARE_PROVIDER_SITE_OTHER): Payer: Medicare HMO | Admitting: Physician Assistant

## 2021-09-27 ENCOUNTER — Encounter: Payer: Self-pay | Admitting: Physician Assistant

## 2021-09-27 VITALS — BP 155/80 | HR 96 | Resp 17 | Ht 68.5 in | Wt 243.0 lb

## 2021-09-27 DIAGNOSIS — M7121 Synovial cyst of popliteal space [Baker], right knee: Secondary | ICD-10-CM | POA: Diagnosis not present

## 2021-09-27 DIAGNOSIS — Z79899 Other long term (current) drug therapy: Secondary | ICD-10-CM

## 2021-09-27 DIAGNOSIS — M810 Age-related osteoporosis without current pathological fracture: Secondary | ICD-10-CM

## 2021-09-27 DIAGNOSIS — C9110 Chronic lymphocytic leukemia of B-cell type not having achieved remission: Secondary | ICD-10-CM | POA: Diagnosis not present

## 2021-09-27 DIAGNOSIS — M359 Systemic involvement of connective tissue, unspecified: Secondary | ICD-10-CM | POA: Diagnosis not present

## 2021-09-27 DIAGNOSIS — M19042 Primary osteoarthritis, left hand: Secondary | ICD-10-CM

## 2021-09-27 DIAGNOSIS — L920 Granuloma annulare: Secondary | ICD-10-CM | POA: Diagnosis not present

## 2021-09-27 DIAGNOSIS — Z8639 Personal history of other endocrine, nutritional and metabolic disease: Secondary | ICD-10-CM | POA: Diagnosis not present

## 2021-09-27 DIAGNOSIS — M19041 Primary osteoarthritis, right hand: Secondary | ICD-10-CM | POA: Diagnosis not present

## 2021-09-27 DIAGNOSIS — M7122 Synovial cyst of popliteal space [Baker], left knee: Secondary | ICD-10-CM

## 2021-09-27 DIAGNOSIS — Z8679 Personal history of other diseases of the circulatory system: Secondary | ICD-10-CM | POA: Diagnosis not present

## 2021-09-28 NOTE — Progress Notes (Signed)
WBC count remains elevated but is stable.  Absolute lymphocyte count remains elevated.  Patient has a history of CLL.  GFR is borderline low.  Creatinine WNL.  Rest of CMP WNL.   ESR and UA normal.   Complements  WNL.

## 2021-09-30 LAB — URINALYSIS, ROUTINE W REFLEX MICROSCOPIC
Bilirubin Urine: NEGATIVE
Glucose, UA: NEGATIVE
Hgb urine dipstick: NEGATIVE
Ketones, ur: NEGATIVE
Leukocytes,Ua: NEGATIVE
Nitrite: NEGATIVE
Protein, ur: NEGATIVE
Specific Gravity, Urine: 1.011 (ref 1.001–1.035)
pH: 5.5 (ref 5.0–8.0)

## 2021-09-30 LAB — CBC WITH DIFFERENTIAL/PLATELET
Absolute Monocytes: 625 cells/uL (ref 200–950)
Basophils Absolute: 53 cells/uL (ref 0–200)
Basophils Relative: 0.4 %
Eosinophils Absolute: 213 cells/uL (ref 15–500)
Eosinophils Relative: 1.6 %
HCT: 39.2 % (ref 35.0–45.0)
Hemoglobin: 13.1 g/dL (ref 11.7–15.5)
Lymphs Abs: 6557 cells/uL — ABNORMAL HIGH (ref 850–3900)
MCH: 30 pg (ref 27.0–33.0)
MCHC: 33.4 g/dL (ref 32.0–36.0)
MCV: 89.7 fL (ref 80.0–100.0)
MPV: 10.3 fL (ref 7.5–12.5)
Monocytes Relative: 4.7 %
Neutro Abs: 5852 cells/uL (ref 1500–7800)
Neutrophils Relative %: 44 %
Platelets: 269 10*3/uL (ref 140–400)
RBC: 4.37 10*6/uL (ref 3.80–5.10)
RDW: 12.2 % (ref 11.0–15.0)
Total Lymphocyte: 49.3 %
WBC: 13.3 10*3/uL — ABNORMAL HIGH (ref 3.8–10.8)

## 2021-09-30 LAB — ANTI-DNA ANTIBODY, DOUBLE-STRANDED: ds DNA Ab: 12 IU/mL — ABNORMAL HIGH

## 2021-09-30 LAB — COMPLETE METABOLIC PANEL WITH GFR
AG Ratio: 1.7 (calc) (ref 1.0–2.5)
ALT: 15 U/L (ref 6–29)
AST: 18 U/L (ref 10–35)
Albumin: 4.2 g/dL (ref 3.6–5.1)
Alkaline phosphatase (APISO): 83 U/L (ref 37–153)
BUN: 17 mg/dL (ref 7–25)
CO2: 30 mmol/L (ref 20–32)
Calcium: 9.3 mg/dL (ref 8.6–10.4)
Chloride: 100 mmol/L (ref 98–110)
Creat: 0.99 mg/dL (ref 0.60–1.00)
Globulin: 2.5 g/dL (calc) (ref 1.9–3.7)
Glucose, Bld: 99 mg/dL (ref 65–99)
Potassium: 3.9 mmol/L (ref 3.5–5.3)
Sodium: 138 mmol/L (ref 135–146)
Total Bilirubin: 0.6 mg/dL (ref 0.2–1.2)
Total Protein: 6.7 g/dL (ref 6.1–8.1)
eGFR: 59 mL/min/{1.73_m2} — ABNORMAL LOW (ref >=60)

## 2021-09-30 LAB — ANA: Anti Nuclear Antibody (ANA): POSITIVE — AB

## 2021-09-30 LAB — C3 AND C4
C3 Complement: 155 mg/dL (ref 83–193)
C4 Complement: 33 mg/dL (ref 15–57)

## 2021-09-30 LAB — SEDIMENTATION RATE: Sed Rate: 14 mm/h (ref 0–30)

## 2021-09-30 LAB — ANTI-NUCLEAR AB-TITER (ANA TITER): ANA Titer 1: 1:80 {titer} — ABNORMAL HIGH

## 2021-09-30 NOTE — Progress Notes (Signed)
ANA and double-stranded DNA are stable.  No change in treatment advised.

## 2021-10-26 ENCOUNTER — Inpatient Hospital Stay (HOSPITAL_COMMUNITY): Payer: Medicare HMO | Attending: Hematology

## 2021-10-26 DIAGNOSIS — Z807 Family history of other malignant neoplasms of lymphoid, hematopoietic and related tissues: Secondary | ICD-10-CM | POA: Insufficient documentation

## 2021-10-26 DIAGNOSIS — Z803 Family history of malignant neoplasm of breast: Secondary | ICD-10-CM | POA: Insufficient documentation

## 2021-10-26 DIAGNOSIS — M069 Rheumatoid arthritis, unspecified: Secondary | ICD-10-CM | POA: Diagnosis not present

## 2021-10-26 DIAGNOSIS — I10 Essential (primary) hypertension: Secondary | ICD-10-CM | POA: Insufficient documentation

## 2021-10-26 DIAGNOSIS — C9110 Chronic lymphocytic leukemia of B-cell type not having achieved remission: Secondary | ICD-10-CM

## 2021-10-26 DIAGNOSIS — Z806 Family history of leukemia: Secondary | ICD-10-CM | POA: Diagnosis not present

## 2021-10-26 DIAGNOSIS — E538 Deficiency of other specified B group vitamins: Secondary | ICD-10-CM | POA: Diagnosis not present

## 2021-10-26 DIAGNOSIS — Z90710 Acquired absence of both cervix and uterus: Secondary | ICD-10-CM | POA: Diagnosis not present

## 2021-10-26 DIAGNOSIS — M8580 Other specified disorders of bone density and structure, unspecified site: Secondary | ICD-10-CM | POA: Diagnosis not present

## 2021-10-26 DIAGNOSIS — Z87891 Personal history of nicotine dependence: Secondary | ICD-10-CM | POA: Insufficient documentation

## 2021-10-26 LAB — CBC WITH DIFFERENTIAL/PLATELET
Band Neutrophils: 1 %
Basophils Absolute: 0 10*3/uL (ref 0.0–0.1)
Basophils Relative: 0 %
Eosinophils Absolute: 0.1 10*3/uL (ref 0.0–0.5)
Eosinophils Relative: 1 %
HCT: 41.2 % (ref 36.0–46.0)
Hemoglobin: 13.5 g/dL (ref 12.0–15.0)
Lymphocytes Relative: 50 %
Lymphs Abs: 6.5 10*3/uL — ABNORMAL HIGH (ref 0.7–4.0)
MCH: 30.2 pg (ref 26.0–34.0)
MCHC: 32.8 g/dL (ref 30.0–36.0)
MCV: 92.2 fL (ref 80.0–100.0)
Monocytes Absolute: 0.3 10*3/uL (ref 0.1–1.0)
Monocytes Relative: 2 %
Neutro Abs: 6.1 10*3/uL (ref 1.7–7.7)
Neutrophils Relative %: 46 %
Platelets: 257 10*3/uL (ref 150–400)
RBC: 4.47 MIL/uL (ref 3.87–5.11)
RDW: 12.9 % (ref 11.5–15.5)
WBC: 12.9 10*3/uL — ABNORMAL HIGH (ref 4.0–10.5)
nRBC: 0 % (ref 0.0–0.2)

## 2021-10-26 LAB — COMPREHENSIVE METABOLIC PANEL
ALT: 17 U/L (ref 0–44)
AST: 22 U/L (ref 15–41)
Albumin: 4.2 g/dL (ref 3.5–5.0)
Alkaline Phosphatase: 81 U/L (ref 38–126)
Anion gap: 5 (ref 5–15)
BUN: 18 mg/dL (ref 8–23)
CO2: 27 mmol/L (ref 22–32)
Calcium: 9.6 mg/dL (ref 8.9–10.3)
Chloride: 103 mmol/L (ref 98–111)
Creatinine, Ser: 0.9 mg/dL (ref 0.44–1.00)
GFR, Estimated: >60 mL/min (ref >60)
Glucose, Bld: 109 mg/dL — ABNORMAL HIGH (ref 70–99)
Potassium: 4.1 mmol/L (ref 3.5–5.1)
Sodium: 135 mmol/L (ref 135–145)
Total Bilirubin: 0.4 mg/dL (ref 0.3–1.2)
Total Protein: 7.2 g/dL (ref 6.5–8.1)

## 2021-10-26 LAB — LACTATE DEHYDROGENASE: LDH: 161 U/L (ref 98–192)

## 2021-11-02 ENCOUNTER — Other Ambulatory Visit: Payer: Self-pay

## 2021-11-02 ENCOUNTER — Inpatient Hospital Stay (HOSPITAL_BASED_OUTPATIENT_CLINIC_OR_DEPARTMENT_OTHER): Payer: Medicare HMO | Admitting: Hematology

## 2021-11-02 VITALS — BP 138/80 | HR 92 | Temp 98.3°F | Resp 18 | Ht 68.5 in | Wt 245.7 lb

## 2021-11-02 DIAGNOSIS — Z807 Family history of other malignant neoplasms of lymphoid, hematopoietic and related tissues: Secondary | ICD-10-CM | POA: Diagnosis not present

## 2021-11-02 DIAGNOSIS — Z803 Family history of malignant neoplasm of breast: Secondary | ICD-10-CM | POA: Diagnosis not present

## 2021-11-02 DIAGNOSIS — Z806 Family history of leukemia: Secondary | ICD-10-CM | POA: Diagnosis not present

## 2021-11-02 DIAGNOSIS — C9110 Chronic lymphocytic leukemia of B-cell type not having achieved remission: Secondary | ICD-10-CM

## 2021-11-02 DIAGNOSIS — M069 Rheumatoid arthritis, unspecified: Secondary | ICD-10-CM | POA: Diagnosis not present

## 2021-11-02 DIAGNOSIS — M8580 Other specified disorders of bone density and structure, unspecified site: Secondary | ICD-10-CM | POA: Diagnosis not present

## 2021-11-02 DIAGNOSIS — E538 Deficiency of other specified B group vitamins: Secondary | ICD-10-CM | POA: Diagnosis not present

## 2021-11-02 DIAGNOSIS — I10 Essential (primary) hypertension: Secondary | ICD-10-CM | POA: Diagnosis not present

## 2021-11-02 DIAGNOSIS — Z87891 Personal history of nicotine dependence: Secondary | ICD-10-CM | POA: Diagnosis not present

## 2021-11-02 DIAGNOSIS — Z90710 Acquired absence of both cervix and uterus: Secondary | ICD-10-CM | POA: Diagnosis not present

## 2021-11-02 NOTE — Progress Notes (Signed)
? ?[facility] ?[address] ?[place], [place] [address] ? ? ?CLINIC:  ?Medical Oncology/Hematology ? ?PCP:  ?Jake Samples, PA-C ?[address] / [place] [address] ?[phone] ? ? ?REASON FOR VISIT:  ?Follow-up for CLL ? ?PRIOR THERAPY: none ? ?NGS Results: not done ? ?CURRENT THERAPY: surveillance ? ?BRIEF ONCOLOGIC HISTORY:  ?Oncology History  ? No history exists.  ? ? ?CANCER STAGING: ? Cancer Staging  ?No matching staging information was found for the patient. ? ?INTERVAL HISTORY:  ?[name], a 76 y.o. female, returns for routine follow-up of her CLL. Jacqueline Casey was last seen on 05/04/2021.  ? ?Today she reports feeling good. She denies fevers, night sweats, weight loss, and recent infections. She reports a rash behind her right elbow.  ? ?REVIEW OF SYSTEMS:  ?Review of Systems  ?Constitutional:  Negative for appetite change, fatigue, fever and unexpected weight change.  ?Endocrine: Negative for hot flashes.  ?Musculoskeletal:  Positive for arthralgias.  ?Skin:  Positive for rash (R elbow).  ?Neurological:  Positive for numbness.  ?All other systems reviewed and are negative. ? ?PAST MEDICAL/SURGICAL HISTORY:  ?Past Medical History:  ?Diagnosis Date  ? Baker's cyst of knee 10/18/2011  ? CLL (chronic lymphocytic leukemia) ([place])   ? per patient   ? Hypertension   ? Osteoarthritis   ? Thyroid disease   ? ?Past Surgical History:  ?Procedure Laterality Date  ? FOOT SURGERY Right 2017  ? hammer toe   ? HAND SURGERY Left 2014  ? finger, hand  ? SKIN TAG REMOVAL  11/07/2018  ? stomach and below left eye   ? THYROID SURGERY    ? TUBAL LIGATION    ? VAGINAL HYSTERECTOMY    ? ? ?SOCIAL HISTORY:  ?Social History  ? ?Socioeconomic History  ? Marital status: Married  ?  Spouse name: Not on file  ? Number of children: 3  ? Years of education: 91  ? Highest education level: Not on file  ?Occupational History  ? Not on file  ?Tobacco Use  ? Smoking status: Former  ?  Packs/day: 1.50  ?   Years: 15.00  ?  Pack years: 22.50  ?  Types: Cigarettes  ?  Quit date: 07/1999  ?  Years since quitting: 22.2  ? Smokeless tobacco: Never  ?Vaping Use  ? Vaping Use: Never used  ?Substance and Sexual Activity  ? Alcohol use: No  ? Drug use: No  ? Sexual activity: Not on file  ?Other Topics Concern  ? Not on file  ?Social History Narrative  ? Not on file  ? ?Social Determinants of Health  ? ?Financial Resource Strain: Not on file  ?Food Insecurity: Not on file  ?Transportation Needs: Not on file  ?Physical Activity: Not on file  ?Stress: Not on file  ?Social Connections: Not on file  ?Intimate Partner Violence: Not on file  ? ? ?FAMILY HISTORY:  ?Family History  ?Problem Relation Age of Onset  ? Cancer Other   ? Diabetes Other   ? Cancer Mother   ?     breast  ? COPD Mother   ? Leukemia Father   ? Osteoporosis Sister   ? Asthma Sister   ? Dermatomyositis Sister   ? COPD Sister   ? Kidney disease Sister   ? Cancer Son   ?     lymphoma, colon  ? Diabetes Son   ? COPD Daughter   ? Osteoporosis Daughter   ?  Rheum arthritis Daughter   ? ? ?CURRENT MEDICATIONS:  ?Current Outpatient Medications  ?Medication Sig Dispense Refill  ? acetaminophen (TYLENOL) 650 MG CR tablet Take 650 mg by mouth as needed for pain.    ? b complex vitamins capsule Take 1 capsule by mouth daily.    ? CALCIUM PO Take by mouth daily.    ? diclofenac sodium (VOLTAREN) 1 % GEL Apply 3 grams to 3 large joints up to 3 times daily. 3 Tube 3  ? hydrochlorothiazide (HYDRODIURIL) 12.5 MG tablet Take 12.5 mg by mouth daily.    ? ibuprofen (ADVIL,MOTRIN) 200 MG tablet Take 200 mg by mouth every 6 (six) hours as needed.    ? levothyroxine (SYNTHROID, LEVOTHROID) 88 MCG tablet Take 88 mcg by mouth daily.    ? triamcinolone cream (KENALOG) 0.1 %   3  ? vitamin B-12 (CYANOCOBALAMIN) 500 MCG tablet Take 500 mcg by mouth 2 (two) times daily.    ? VITAMIN D PO Take by mouth daily.    ? ?No current facility-administered medications for this visit.  ? ? ?ALLERGIES:   ?Allergies  ?Allergen Reactions  ? Sulfa Antibiotics   ? ? ?PHYSICAL EXAM:  ?Performance status (ECOG): 1 - Symptomatic but completely ambulatory ? ?Vitals:  ? 11/02/21 1518  ?BP: 138/80  ?Pulse: 92  ?Resp: 18  ?Temp: 98.3 ?F (36.8 ?C)  ?SpO2: 99%  ? ?Wt Readings from Last 3 Encounters:  ?11/02/21 245 lb 11.2 oz (111.4 kg)  ?09/27/21 243 lb (110.2 kg)  ?03/22/21 234 lb 9.6 oz (106.4 kg)  ? ?Physical Exam ?Vitals reviewed.  ?Constitutional:   ?   Appearance: Normal appearance. She is obese.  ?Cardiovascular:  ?   Rate and Rhythm: Normal rate and regular rhythm.  ?   Pulses: Normal pulses.  ?   Heart sounds: Normal heart sounds.  ?Pulmonary:  ?   Effort: Pulmonary effort is normal.  ?   Breath sounds: Normal breath sounds.  ?Abdominal:  ?   Palpations: Abdomen is soft. There is no hepatomegaly, splenomegaly or mass.  ?   Tenderness: There is no abdominal tenderness.  ?Lymphadenopathy:  ?   Cervical: No cervical adenopathy.  ?   Right cervical: No superficial cervical adenopathy. ?   Left cervical: No superficial cervical adenopathy.  ?   Upper Body:  ?   Right upper body: No supraclavicular or axillary adenopathy.  ?   Left upper body: No supraclavicular or axillary adenopathy.  ?   Lower Body: No right inguinal adenopathy. No left inguinal adenopathy.  ?Neurological:  ?   General: No focal deficit present.  ?   Mental Status: She is alert and oriented to person, place, and time.  ?Psychiatric:     ?   Mood and Affect: Mood normal.     ?   Behavior: Behavior normal.  ?  ? ?LABORATORY DATA:  ?I have reviewed the labs as listed.  ?CBC Latest Ref Rng & Units 10/26/2021 09/27/2021 04/27/2021  ?WBC 4.0 - 10.5 K/uL 12.9(H) 13.3(H) 13.6(H)  ?Hemoglobin 12.0 - 15.0 g/dL 13.5 13.1 12.6  ?Hematocrit 36.0 - 46.0 % 41.2 39.2 39.2  ?Platelets 150 - 400 K/uL 257 269 233  ? ?CMP Latest Ref Rng & Units 10/26/2021 09/27/2021 04/27/2021  ?Glucose 70 - 99 mg/dL 109(H) 99 122(H)  ?BUN 8 - 23 mg/dL '18 17 14  '$ ?Creatinine 0.44 - 1.00 mg/dL 0.90 0.99  0.81  ?Sodium 135 - 145 mmol/L 135 138 138  ?Potassium 3.5 -  5.1 mmol/L 4.1 3.9 3.5  ?Chloride 98 - 111 mmol/L 103 100 102  ?CO2 22 - 32 mmol/L '27 30 27  '$ ?Calcium 8.9 - 10.3 mg/dL 9.6 9.3 9.0  ?Total Protein 6.5 - 8.1 g/dL 7.2 6.7 6.6  ?Total Bilirubin 0.3 - 1.2 mg/dL 0.4 0.6 0.7  ?Alkaline Phos 38 - 126 U/L 81 - 82  ?AST 15 - 41 U/L '22 18 23  '$ ?ALT 0 - 44 U/L '17 15 19  '$ ? ? ?DIAGNOSTIC IMAGING:  ?I have independently reviewed the scans and discussed with the patient. ?No results found.  ? ?ASSESSMENT:  ?1.  Clinical stage 0 CLL: ?-She was evaluated at the request of Dr. Estanislado Pandy for elevated white count, predominantly lymphocytes since January 2019. ?-Denies any B symptoms.  Physical exam does not show any lymphadenopathy or splenomegaly. ?-We reviewed results of flow cytometry from 12/03/2019.  Monoclonal B-cell population with coexpression of CD5 comprises 36% of lymphocytes.  Overall the morphology and immunophenotype favor CLL.  However there is a small chance of it being atypical mantle cell lymphoma. ?  ?2.  Rheumatoid arthritis: ?-Small joints of the hands involved.  Plaquenil did not help. ?-She is taking Tylenol and NSAIDs. ?  ?3.  Osteopenia: ?-DEXA scan on 02/07/2018 shows T score -2.3. ?-Vitamin D level was borderline low at 24. ?  ?4.  Vitamin B12 deficiency: ?-B12 level was low at 177.  She was given B12 injection today.  She was told to start taking B12 1 mg tablet daily. ? ? ?PLAN:  ?1.  Clinical stage 0 CLL: ?- She does not report any fevers, night sweats or weight loss in the last 6 months.  No infections. ?- Physical examination does not reveal any palpable adenopathy or splenomegaly. ?- Reviewed labs today which showed normal LFTs and LDH.  CBC was grossly normal with mildly elevated white count 12.9, predominantly lymphocytes at 50% differential.  Hemoglobin was normal. ?- No indications for treatment at this time.  RTC 6 months for follow-up. ?  ?2.  Rheumatoid arthritis: ?- Continue Tylenol and  NSAIDs and follow-up with Dr. Estanislado Pandy. ?  ?3.  Osteopenia: ?- Continue vitamin D supplements. ?  ?4.  Vitamin B12 deficiency: ?- Continue B12 tablet daily. ?  ?Orders placed this encounter:  ?No orders

## 2021-11-02 NOTE — Patient Instructions (Signed)
[facility] at [facility] ?Discharge Instructions ? ?You were seen and examined today by Dr. Delton Coombes. He reviewed your most recent labs and everything looks stable. Please keep follow up appointments as scheduled in 6 months. ? ? ?Thank you for choosing [facility] at [facility] to provide your oncology and hematology care.  To afford each patient quality time with our provider, please arrive at least 15 minutes before your scheduled appointment time.  ? ?If you have a lab appointment with the [facility] please come in thru the Main Entrance and check in at the main information desk. ? ?You need to re-schedule your appointment should you arrive 10 or more minutes late.  We strive to give you quality time with our providers, and arriving late affects you and other patients whose appointments are after yours.  Also, if you no show three or more times for appointments you may be dismissed from the clinic at the providers discretion.     ?Again, thank you for choosing [facility].  Our hope is that these requests will decrease the amount of time that you wait before being seen by our physicians.       ?_____________________________________________________________ ? ?Should you have questions after your visit to [facility], please contact our office at [phone] and follow the prompts.  Our office hours are 8:00 a.m. and 4:30 p.m. Monday - Friday.  Please note that voicemails left after 4:00 p.m. may not be returned until the following business day.  We are closed weekends and major holidays.  You do have access to a nurse 24-7, just call the main number to the clinic [phone] and do not press any options, hold on the line and a nurse will answer the phone.   ? ?For prescription refill requests, have your pharmacy contact our office and allow 72 hours.   ? ?Due to Covid, you will need to wear a mask upon entering the hospital. If you  do not have a mask, a mask will be given to you at the Main Entrance upon arrival. For doctor visits, patients may have 1 support person age 80 or older with them. For treatment visits, patients can not have anyone with them due to social distancing guidelines and our immunocompromised population.  ? ?  ?

## 2022-02-23 DIAGNOSIS — L918 Other hypertrophic disorders of the skin: Secondary | ICD-10-CM | POA: Diagnosis not present

## 2022-02-23 DIAGNOSIS — L920 Granuloma annulare: Secondary | ICD-10-CM | POA: Diagnosis not present

## 2022-02-23 DIAGNOSIS — L821 Other seborrheic keratosis: Secondary | ICD-10-CM | POA: Diagnosis not present

## 2022-02-23 DIAGNOSIS — L820 Inflamed seborrheic keratosis: Secondary | ICD-10-CM | POA: Diagnosis not present

## 2022-02-23 DIAGNOSIS — L304 Erythema intertrigo: Secondary | ICD-10-CM | POA: Diagnosis not present

## 2022-03-02 DIAGNOSIS — H11151 Pinguecula, right eye: Secondary | ICD-10-CM | POA: Diagnosis not present

## 2022-03-02 DIAGNOSIS — H01001 Unspecified blepharitis right upper eyelid: Secondary | ICD-10-CM | POA: Diagnosis not present

## 2022-03-07 DIAGNOSIS — Z0100 Encounter for examination of eyes and vision without abnormal findings: Secondary | ICD-10-CM | POA: Diagnosis not present

## 2022-03-14 NOTE — Progress Notes (Deleted)
Office Visit Note  Patient: Jacqueline Casey             Date of Birth: [birth year]           MRN: [id number]             PCP: Jake Samples, PA-C Referring: Jake Samples, [place]* Visit Date: 03/28/2022 Occupation: '@GUAROCC' @  Subjective:    History of Present Illness: Jacqueline Casey is a 76 y.o. female with history of autoimmune disease and osteoarthritis. She is not currently taking any immunosuppressive agents.   Lab work from 09/27/21 was reviewed today in the office: ANA 1:80NS, dsDNA 12, complements WNL, and ESR WNL.  Activities of Daily Living:  Patient reports morning stiffness for *** {minute/hour:19697}.   Patient {ACTIONS;DENIES/REPORTS:21021675::"Denies"} nocturnal pain.  Difficulty dressing/grooming: {ACTIONS;DENIES/REPORTS:21021675::"Denies"} Difficulty climbing stairs: {ACTIONS;DENIES/REPORTS:21021675::"Denies"} Difficulty getting out of chair: {ACTIONS;DENIES/REPORTS:21021675::"Denies"} Difficulty using hands for taps, buttons, cutlery, and/or writing: {ACTIONS;DENIES/REPORTS:21021675::"Denies"}  No Rheumatology ROS completed.   PMFS History:  Patient Active Problem List   Diagnosis Date Noted   CLL (chronic lymphocytic leukemia) ([place]) 12/29/2019   Leukocytosis 12/03/2019   Baker's cyst of knee 10/18/2011   Knee mass 10/04/2011    Past Medical History:  Diagnosis Date   Baker's cyst of knee 10/18/2011   CLL (chronic lymphocytic leukemia) ([facility])    per patient    Hypertension    Osteoarthritis    Thyroid disease     Family History  Problem Relation Age of Onset   Cancer Other    Diabetes Other    Cancer Mother        breast   COPD Mother    Leukemia Father    Osteoporosis Sister    Asthma Sister    Dermatomyositis Sister    COPD Sister    Kidney disease Sister    Cancer Son        lymphoma, colon   Diabetes Son    COPD Daughter    Osteoporosis Daughter    Rheum arthritis Daughter    Past Surgical History:  Procedure Laterality  Date   FOOT SURGERY Right 2017   hammer toe    HAND SURGERY Left 2014   finger, hand   SKIN TAG REMOVAL  11/07/2018   stomach and below left eye    THYROID SURGERY     TUBAL LIGATION     VAGINAL HYSTERECTOMY     Social History   Social History Narrative   Not on file    There is no immunization history on file for this patient.   Objective: Vital Signs: There were no vitals taken for this visit.   Physical Exam Vitals and nursing note reviewed.  Constitutional:      Appearance: She is well-developed.  HENT:     Head: Normocephalic and atraumatic.  Eyes:     Conjunctiva/sclera: Conjunctivae normal.  Cardiovascular:     Rate and Rhythm: Normal rate and regular rhythm.     Heart sounds: Normal heart sounds.  Pulmonary:     Effort: Pulmonary effort is normal.     Breath sounds: Normal breath sounds.  Abdominal:     General: Bowel sounds are normal.     Palpations: Abdomen is soft.  Musculoskeletal:     Cervical back: Normal range of motion.  Skin:    General: Skin is warm and dry.     Capillary Refill: Capillary refill takes less than 2 seconds.  Neurological:     Mental Status: She is alert  and oriented to person, place, and time.  Psychiatric:        Behavior: Behavior normal.      Musculoskeletal Exam: ***  CDAI Exam: CDAI Score: -- Patient Global: --; Provider Global: -- Swollen: --; Tender: -- Joint Exam 03/28/2022   No joint exam has been documented for this visit   There is currently no information documented on the homunculus. Go to the Rheumatology activity and complete the homunculus joint exam.  Investigation: No additional findings.  Imaging: No results found.  Recent Labs: Lab Results  Component Value Date   WBC 12.9 (H) 10/26/2021   HGB 13.5 10/26/2021   PLT 257 10/26/2021   NA 135 10/26/2021   K 4.1 10/26/2021   CL 103 10/26/2021   CO2 27 10/26/2021   GLUCOSE 109 (H) 10/26/2021   BUN 18 10/26/2021   CREATININE 0.90 10/26/2021    BILITOT 0.4 10/26/2021   ALKPHOS 81 10/26/2021   AST 22 10/26/2021   ALT 17 10/26/2021   PROT 7.2 10/26/2021   ALBUMIN 4.2 10/26/2021   CALCIUM 9.6 10/26/2021   GFRAA 45 (L) 03/29/2020    Speciality Comments: PLQ eye exam: 03/06/2018 normal. Dr. Marvel Plan. Follow up in 6 months.  Procedures:  No procedures performed Allergies: Sulfa antibiotics   Assessment / Plan:     Visit Diagnoses: No diagnosis found.  Orders: No orders of the defined types were placed in this encounter.  No orders of the defined types were placed in this encounter.   Face-to-face time spent with patient was *** minutes. Greater than 50% of time was spent in counseling and coordination of care.  Follow-Up Instructions: No follow-ups on file.   Earnestine Mealing, CMA  Note - This record has been created using Editor, commissioning.  Chart creation errors have been sought, but may not always  have been located. Such creation errors do not reflect on  the standard of medical care.

## 2022-03-22 ENCOUNTER — Ambulatory Visit: Payer: Medicare HMO | Admitting: Podiatry

## 2022-03-22 ENCOUNTER — Encounter: Payer: Self-pay | Admitting: Podiatry

## 2022-03-22 ENCOUNTER — Ambulatory Visit (INDEPENDENT_AMBULATORY_CARE_PROVIDER_SITE_OTHER): Payer: Medicare HMO

## 2022-03-22 DIAGNOSIS — M2041 Other hammer toe(s) (acquired), right foot: Secondary | ICD-10-CM

## 2022-03-22 DIAGNOSIS — M7751 Other enthesopathy of right foot: Secondary | ICD-10-CM | POA: Diagnosis not present

## 2022-03-22 NOTE — Progress Notes (Signed)
Subjective:   Patient ID: Jacqueline Casey, female   DOB: 76 y.o.   MRN: [id number]   HPI Patient presents stating that the right foot was hurting and the lesser joint and also she is getting cramping pains in the right foot stating its been going on for around a month.  Had previous surgery done about 6 years ago as no other significant changes in health does not smoke tries to be active   Review of Systems  All other systems reviewed and are negative.       Objective:  Physical Exam Vitals and nursing note reviewed.  Constitutional:      Appearance: She is well-developed.  Pulmonary:     Effort: Pulmonary effort is normal.  Musculoskeletal:        General: Normal range of motion.  Skin:    General: Skin is warm.  Neurological:     Mental Status: She is alert.     Neurovascular status intact muscle strength adequate range of motion within normal limits with patient who does have CLL but it is stable and is found to have quite a bit of inflammation fluid around the third metatarsal phalangeal joint with the possibility that she may have some degree of rheumatoid arthritis or systemic arthritic condition.  Patient has good digital perfusion well oriented x3     Assessment:  Acute capsulitis of the third MPJ right which may be local or possibly could have systemic element to it with patient noted to have good digital perfusion well oriented x3     Plan:  Reviewed all the different causes of this and the possibility for systemic versus local but I recommended trying to reduce the inflammation and I went ahead did a proximal nerve block I aspirated the joint getting out a small amount of clear fluid I injected quarter cc dexamethasone Kenalog into the joint advised on rigid bottom shoes reappoint as needed  X-rays indicate no signs of fracture the second toe has been adequately fused from previous treatment

## 2022-03-28 ENCOUNTER — Ambulatory Visit: Payer: Medicare HMO | Admitting: Physician Assistant

## 2022-03-28 DIAGNOSIS — M810 Age-related osteoporosis without current pathological fracture: Secondary | ICD-10-CM

## 2022-03-28 DIAGNOSIS — M7121 Synovial cyst of popliteal space [Baker], right knee: Secondary | ICD-10-CM

## 2022-03-28 DIAGNOSIS — M19041 Primary osteoarthritis, right hand: Secondary | ICD-10-CM

## 2022-03-28 DIAGNOSIS — M7122 Synovial cyst of popliteal space [Baker], left knee: Secondary | ICD-10-CM

## 2022-03-28 DIAGNOSIS — C9110 Chronic lymphocytic leukemia of B-cell type not having achieved remission: Secondary | ICD-10-CM

## 2022-03-28 DIAGNOSIS — Z8679 Personal history of other diseases of the circulatory system: Secondary | ICD-10-CM

## 2022-03-28 DIAGNOSIS — Z8639 Personal history of other endocrine, nutritional and metabolic disease: Secondary | ICD-10-CM

## 2022-03-28 DIAGNOSIS — L920 Granuloma annulare: Secondary | ICD-10-CM

## 2022-03-28 DIAGNOSIS — M359 Systemic involvement of connective tissue, unspecified: Secondary | ICD-10-CM

## 2022-03-28 DIAGNOSIS — Z79899 Other long term (current) drug therapy: Secondary | ICD-10-CM

## 2022-03-28 NOTE — Progress Notes (Signed)
Office Visit Note  Patient: Jacqueline Casey             Date of Birth: 1946/04/03           MRN: [id number]             PCP: Jake Samples, PA-C Referring: Jake Samples, [place]* Visit Date: 04/07/2022 Occupation: '@GUAROCC' @  Subjective:  Joint pain  History of Present Illness: Jacqueline Casey is a 76 y.o. female with history of autoimmune disease and osteoarthritis.  Patient is not currently taking any immunosuppressive agents.  She continues to experience chronic pain and stiffness involving multiple joints.  Her pain is typically most severe in both knees in the evenings.  She has intermittent inflammation in her right wrist and has had intermittent inflammation in her feet.  She had a recent office visit with Dr. Paulla Dolly on 03/22/2022 at which time she was having swelling in the right third toe.  She had an aspiration and injection performed at that office visit which has provided some relief.  She is using a cane to assist with ambulation.  She has chronic dry mouth but no sores in her mouth or nose. She denies any symptoms of raynaud's. She denies any SOB or pleuritic chest pain.  She denies any recent rashes.   Activities of Daily Living:  Patient reports morning stiffness for 2 hours.   Patient Reports nocturnal pain.  Difficulty dressing/grooming: Reports Difficulty climbing stairs: Reports Difficulty getting out of chair: Reports Difficulty using hands for taps, buttons, cutlery, and/or writing: Reports  Review of Systems  Constitutional:  Negative for fatigue.  HENT:  Positive for mouth dryness. Negative for mouth sores.   Eyes:  Negative for dryness.  Respiratory:  Negative for shortness of breath.   Cardiovascular:  Negative for chest pain and palpitations.  Gastrointestinal:  Negative for blood in stool, constipation and diarrhea.  Endocrine: Negative for increased urination.  Genitourinary:  Negative for involuntary urination.  Musculoskeletal:  Positive for  joint pain, joint pain, joint swelling, myalgias, morning stiffness, muscle tenderness and myalgias. Negative for gait problem and muscle weakness.  Skin:  Positive for rash. Negative for color change, hair loss and sensitivity to sunlight.  Allergic/Immunologic: Negative for susceptible to infections.  Neurological:  Negative for dizziness and headaches.  Hematological:  Negative for swollen glands.  Psychiatric/Behavioral:  Negative for depressed mood and sleep disturbance. The patient is nervous/anxious.     PMFS History:  Patient Active Problem List   Diagnosis Date Noted   CLL (chronic lymphocytic leukemia) ([place]) 12/29/2019   Leukocytosis 12/03/2019   Baker's cyst of knee 10/18/2011   Knee mass 10/04/2011    Past Medical History:  Diagnosis Date   Baker's cyst of knee 10/18/2011   CLL (chronic lymphocytic leukemia) ([place])    per patient    Hypertension    Osteoarthritis    Thyroid disease     Family History  Problem Relation Age of Onset   Cancer Other    Diabetes Other    Cancer Mother        breast   COPD Mother    Leukemia Father    Osteoporosis Sister    Asthma Sister    Dermatomyositis Sister    COPD Sister    Kidney disease Sister    Cancer Son        lymphoma, colon   Diabetes Son    COPD Daughter    Osteoporosis Daughter    Rheum arthritis  Daughter    Past Surgical History:  Procedure Laterality Date   FOOT SURGERY Right 2017   hammer toe    HAND SURGERY Left 2014   finger, hand   SKIN TAG REMOVAL  11/07/2018   stomach and below left eye    THYROID SURGERY     TUBAL LIGATION     VAGINAL HYSTERECTOMY     Social History   Social History Narrative   Not on file    There is no immunization history on file for this patient.   Objective: Vital Signs: BP (!) 158/80 (BP Location: Left Arm, Patient Position: Sitting, Cuff Size: Normal)   Pulse 80   Resp 17   Ht 5' 8.5" (1.74 m)   Wt 243 lb (110.2 kg)   BMI 36.41 kg/m    Physical Exam Vitals  and nursing note reviewed.  Constitutional:      Appearance: She is well-developed.  HENT:     Head: Normocephalic and atraumatic.  Eyes:     Conjunctiva/sclera: Conjunctivae normal.  Cardiovascular:     Rate and Rhythm: Normal rate and regular rhythm.     Heart sounds: Normal heart sounds.  Pulmonary:     Effort: Pulmonary effort is normal.     Breath sounds: Normal breath sounds.  Abdominal:     General: Bowel sounds are normal.     Palpations: Abdomen is soft.  Musculoskeletal:     Cervical back: Normal range of motion.  Skin:    General: Skin is warm and dry.     Capillary Refill: Capillary refill takes less than 2 seconds.  Neurological:     Mental Status: She is alert and oriented to person, place, and time.  Psychiatric:        Behavior: Behavior normal.      Musculoskeletal Exam: C-spine has good ROM.  Shoulder joints and elbow joints have good ROM.  Limited ROM of the right wrist with synovial thickening.  No tenderness or synovitis of MCP joints.  Mild radial styloid tenosynovitis of the right wrist noted.  PIP and DIP thickening consistent with osteoarthritis of both hands.  Hip joints have good ROM with no groin pain.  Painful ROM of both knees.  Baker's cyst palpable on both knees.  Ankle joints have good ROM with no tenderness or joint swelling.  Tenderness over MTP joints especially the right second and third MTPs.  CDAI Exam: CDAI Score: -- Patient Global: --; Provider Global: -- Swollen: --; Tender: -- Joint Exam 04/07/2022   No joint exam has been documented for this visit   There is currently no information documented on the homunculus. Go to the Rheumatology activity and complete the homunculus joint exam.  Investigation: No additional findings.  Imaging: DG Mandible 4 Views  Result Date: 03/31/2022 CLINICAL DATA:  Fall. EXAM: MANDIBLE - 4+ VIEW COMPARISON:  None Available. FINDINGS: There is no evidence of fracture or other focal bone lesions.  Patient is edentulous. IMPRESSION: No acute abnormality. Electronically Signed   By: Ronney Asters M.D.   On: 03/31/2022 18:19   DG Foot 2 Views Right  Result Date: 03/24/2022 Please see detailed radiograph report in office note.   Recent Labs: Lab Results  Component Value Date   WBC 12.9 (H) 10/26/2021   HGB 13.5 10/26/2021   PLT 257 10/26/2021   NA 135 10/26/2021   K 4.1 10/26/2021   CL 103 10/26/2021   CO2 27 10/26/2021   GLUCOSE 109 (H) 10/26/2021   BUN  18 10/26/2021   CREATININE 0.90 10/26/2021   BILITOT 0.4 10/26/2021   ALKPHOS 81 10/26/2021   AST 22 10/26/2021   ALT 17 10/26/2021   PROT 7.2 10/26/2021   ALBUMIN 4.2 10/26/2021   CALCIUM 9.6 10/26/2021   GFRAA 45 (L) 03/29/2020    Speciality Comments: PLQ eye exam: 03/06/2018 normal. Dr. Marvel Plan. Follow up in 6 months.  Procedures:  No procedures performed Allergies: Sulfa antibiotics   Assessment / Plan:     Visit Diagnoses: Autoimmune disease ([place]) - ANA 1:160 NH, dsDNA 7, C3/C4 WNL, sed rate WNL, Inflammatory arthritis. X-rays obtained in 2019 showed MCP narrowing consistent with inflammatory arthritis: Patient presents today experiencing increased pain involving multiple joints.  She has mild radial styloid tenosynovitis of the right wrist, ongoing pain in both knees, and a recent flare in her right foot.  She was evaluated by Dr. Paulla Dolly on 03/22/2022 and was diagnosed with acute capsulitis of the left third MPJ she and she underwent an aspiration and cortisone injection at that time.  Discussed that she will benefit from starting on an immunosuppressive agent to control her inflammatory arthritis.  She previously tried Plaquenil but discontinued due to side effects.  She is apprehensive to retry Plaquenil at this time.  Discussed Imuran as a possible alternative.  According to the patient and her sisters on Imuran and has tolerated it for many years.  Indications, contraindications, potential side effects of Imuran were  discussed today in detail.  All questions were addressed and consent was obtained.  She would like to further review the informational handout about Imuran at home prior to making a decision at this time.  She will notify us if she would like to proceed with Imuran in the future.  CBC, CMP, TPMT along with baseline immunosuppressive labs will be obtained today prior to sending a prescription for Imuran. Lab work from 09/27/2021 was reviewed today in the office: ANA 1: 80 NS, double-stranded DNA 12, complements within normal limits, and ESR within normal limits.  Lab results were reviewed with the patient today in the office.  The following lab work will be updated today for further evaluation. As discussed above she will notify us if she would like to initiate Imuran. She will follow up in the office in 3 months or sooner if needed.   - Plan: CBC with Differential/Platelet, COMPLETE METABOLIC PANEL WITH GFR, Urinalysis, Routine w reflex microscopic, Anti-DNA antibody, double-stranded, C3 and C4, Sedimentation rate       Latest Ref Rng & Units 10/26/2021   12:24 PM  Serum Protein Electrophoresis  Total Protein 6.5 - 8.1 g/dL 7.2     TPMT: Pending    Chest Xray: 02/07/05  Patient was counseled on the purpose, proper use, and adverse effects of azathioprine including risk of infection, nausea, rash, and hair loss.  Also informed that medication can cause discoloration of urine, sweat and tears. Reviewed risk of cancer after long term use.  Discussed risk of myelosupression and reviewed importance of frequent lab work to monitor blood counts.  Standing orders placed. Reviewed drug-drug interactions including contraindication with allopurinol.  Provided patient with educational materials on azathioprine and answered all questions.  Patient consented to azathioprine.  Will upload consent into the media tab.   Patient dose will be 50 mg 1 tablet by mouth daily.  Prescription will be sent to pharmacy pending  lab results and insurance approval.  High risk medication use -Discussed starting on Imuran.  She plans on reviewing  the informational handout prior to initiating therapy.  She will notify us if she would like to proceed with imuran.  TMPT, CBC, and CMP will be drawn today along with baseline immunosuppressive labs.   D/c PLQ-inadequate response and SE of hair loss. - Plan: CBC with Differential/Platelet, COMPLETE METABOLIC PANEL WITH GFR, Thiopurine methyltransferase(tpmt)rbc, Serum protein electrophoresis with reflex, QuantiFERON-TB Gold Plus, IgG, IgA, IgM, Hepatitis B surface antigen, Hepatitis C antibody, Hepatitis B core antibody, IgM  Primary osteoarthritis of both hands: She has PIP and DIP thickening consistent with osteoarthritis of both hands.   Synovial cyst of right popliteal space: She continues to have chronic pain in both knee joints.  Synovial cyst of left knee: She continues to have chronic pain in both knee joints especially in the evenings.  She takes Tylenol as needed for symptomatic relief.  Granuloma annulare - She is followed by dermatology.   Age-related osteoporosis without current pathological fracture - DEXA on 10/28/20: The BMD measured at Femur Neck Left is 0.697 g/cm2 with a T-score of -2.5. Patient has declined treatment. Due to update DEXA in March 2024.   Other medical conditions are listed as follows:   CLL (chronic lymphocytic leukemia) ([place]) - She was diagnosed with CLL and has been followed closely at [facility].  History of hypothyroidism - Patient requested to have TSH checked today and results to be forwarded to her PCP. Plan: TSH  History of hypertension  Orders: Orders Placed This Encounter  Procedures   CBC with Differential/Platelet   COMPLETE METABOLIC PANEL WITH GFR   Urinalysis, Routine w reflex microscopic   Anti-DNA antibody, double-stranded   C3 and C4   Sedimentation rate   TSH   Thiopurine methyltransferase(tpmt)rbc    Serum protein electrophoresis with reflex   QuantiFERON-TB Gold Plus   IgG, IgA, IgM   Hepatitis B surface antigen   Hepatitis C antibody   Hepatitis B core antibody, IgM   No orders of the defined types were placed in this encounter.    Follow-Up Instructions: Return in about 3 months (around 07/08/2022) for Autoimmune Disease, Osteoarthritis.   Ofilia Neas, PA-C  Note - This record has been created using Dragon software.  Chart creation errors have been sought, but may not always  have been located. Such creation errors do not reflect on  the standard of medical care.

## 2022-03-31 ENCOUNTER — Other Ambulatory Visit (HOSPITAL_COMMUNITY): Payer: Self-pay | Admitting: Family Medicine

## 2022-03-31 ENCOUNTER — Ambulatory Visit (HOSPITAL_COMMUNITY)
Admission: RE | Admit: 2022-03-31 | Discharge: 2022-03-31 | Disposition: A | Discharge status: Home / Self Care | Payer: Medicare HMO | Source: Ambulatory Visit | Attending: Family Medicine | Admitting: Family Medicine

## 2022-03-31 DIAGNOSIS — W01111A Fall on same level from slipping, tripping and stumbling with subsequent striking against power tool or machine, initial encounter: Secondary | ICD-10-CM | POA: Insufficient documentation

## 2022-03-31 DIAGNOSIS — R6884 Jaw pain: Secondary | ICD-10-CM | POA: Insufficient documentation

## 2022-03-31 DIAGNOSIS — Z043 Encounter for examination and observation following other accident: Secondary | ICD-10-CM | POA: Diagnosis not present

## 2022-03-31 DIAGNOSIS — Z13220 Encounter for screening for lipoid disorders: Secondary | ICD-10-CM | POA: Diagnosis not present

## 2022-03-31 DIAGNOSIS — I10 Essential (primary) hypertension: Secondary | ICD-10-CM | POA: Diagnosis not present

## 2022-03-31 DIAGNOSIS — E039 Hypothyroidism, unspecified: Secondary | ICD-10-CM | POA: Diagnosis not present

## 2022-03-31 DIAGNOSIS — Z6836 Body mass index (BMI) 36.0-36.9, adult: Secondary | ICD-10-CM | POA: Diagnosis not present

## 2022-03-31 DIAGNOSIS — C9190 Lymphoid leukemia, unspecified not having achieved remission: Secondary | ICD-10-CM | POA: Diagnosis not present

## 2022-03-31 DIAGNOSIS — S01511A Laceration without foreign body of lip, initial encounter: Secondary | ICD-10-CM | POA: Diagnosis not present

## 2022-04-03 ENCOUNTER — Other Ambulatory Visit (HOSPITAL_COMMUNITY): Payer: Self-pay | Admitting: Family Medicine

## 2022-04-03 DIAGNOSIS — W01111A Fall on same level from slipping, tripping and stumbling with subsequent striking against power tool or machine, initial encounter: Secondary | ICD-10-CM

## 2022-04-07 ENCOUNTER — Ambulatory Visit: Payer: Medicare HMO | Attending: Physician Assistant | Admitting: Physician Assistant

## 2022-04-07 ENCOUNTER — Telehealth: Payer: Self-pay

## 2022-04-07 ENCOUNTER — Encounter: Payer: Self-pay | Admitting: Physician Assistant

## 2022-04-07 VITALS — BP 158/80 | HR 80 | Resp 17 | Ht 68.5 in | Wt 243.0 lb

## 2022-04-07 DIAGNOSIS — Z8639 Personal history of other endocrine, nutritional and metabolic disease: Secondary | ICD-10-CM | POA: Diagnosis not present

## 2022-04-07 DIAGNOSIS — Z8679 Personal history of other diseases of the circulatory system: Secondary | ICD-10-CM | POA: Diagnosis not present

## 2022-04-07 DIAGNOSIS — M19042 Primary osteoarthritis, left hand: Secondary | ICD-10-CM

## 2022-04-07 DIAGNOSIS — M810 Age-related osteoporosis without current pathological fracture: Secondary | ICD-10-CM | POA: Diagnosis not present

## 2022-04-07 DIAGNOSIS — L920 Granuloma annulare: Secondary | ICD-10-CM | POA: Diagnosis not present

## 2022-04-07 DIAGNOSIS — M19041 Primary osteoarthritis, right hand: Secondary | ICD-10-CM | POA: Diagnosis not present

## 2022-04-07 DIAGNOSIS — M7122 Synovial cyst of popliteal space [Baker], left knee: Secondary | ICD-10-CM

## 2022-04-07 DIAGNOSIS — Z79899 Other long term (current) drug therapy: Secondary | ICD-10-CM | POA: Diagnosis not present

## 2022-04-07 DIAGNOSIS — M7121 Synovial cyst of popliteal space [Baker], right knee: Secondary | ICD-10-CM

## 2022-04-07 DIAGNOSIS — M359 Systemic involvement of connective tissue, unspecified: Secondary | ICD-10-CM

## 2022-04-07 DIAGNOSIS — C9110 Chronic lymphocytic leukemia of B-cell type not having achieved remission: Secondary | ICD-10-CM | POA: Diagnosis not present

## 2022-04-07 NOTE — Patient Instructions (Signed)
Azathioprine Tablets What is this medication? AZATHIOPRINE (ay za THYE oh preen) prevents the body from rejecting an organ transplant. It works by lowering the body's immune system response. This helps the body accept the donor organ. It may also be used to treat rheumatoid arthritis. This medicine may be used for other purposes; ask your health care provider or pharmacist if you have questions. COMMON BRAND NAME(S): Azasan, Imuran What should I tell my care team before I take this medication? They need to know if you have any of these conditions: Infection Kidney disease Liver disease An unusual or allergic reaction to azathioprine, lactose, other medications, foods, dyes, or preservatives Pregnant or trying to get pregnant Breastfeeding How should I use this medication? Take this medication by mouth with a full glass of water. Take it as directed on the prescription label at the same time every day. Keep taking it unless your care team tells you to stop. Keep taking it even if you think you are better. Talk to your care team about the use of this medication in children. Special care may be needed. Overdosage: If you think you have taken too much of this medicine contact a poison control center or emergency room at once. NOTE: This medicine is only for you. Do not share this medicine with others. What if I miss a dose? If you miss a dose, take it as soon as you can. If it is almost time for your next dose, take only that dose. Do not take double or extra doses. What may interact with this medication? Do not take this medication with any of the following: Febuxostat Mercaptopurine This medication may also interact with the following: Allopurinol Aminosalicylates, such as sulfasalazine, mesalamine, balsalazide, and olsalazine Leflunomide Medications called ACE inhibitors, such as benazepril, captopril, enalapril, fosinopril, quinapril, lisinopril, ramipril, and  trandolapril Mycophenolate Sulfamethoxazole; trimethoprim Vaccines Warfarin This list may not describe all possible interactions. Give your health care provider a list of all the medicines, herbs, non-prescription drugs, or dietary supplements you use. Also tell them if you smoke, drink alcohol, or use illegal drugs. Some items may interact with your medicine. What should I watch for while using this medication? Visit your care team for regular checks on your progress. You may need blood work done while you are taking this medication. This medication may increase your risk of getting an infection. Call your care team for advice if you get a fever, chills, sore throat, or other symptoms of a cold or flu. Do not treat yourself. Try to avoid being around people who are sick. Talk to your care team about your risk of cancer. You may be more at risk for certain types of cancer if you take this medication. Talk to your care team if you may be pregnant. This medication can cause serious birth defects if taken during pregnancy. This medication may cause infertility. Talk to your care team if you are concerned about your fertility. What side effects may I notice from receiving this medication? Side effects that you should report to your care team as soon as possible: Allergic reactions--skin rash, itching, hives, swelling of the face, lips, tongue, or throat Change in your skin, such as a new growth, a sore that doesn't heal, or a change in a mole Dizziness, loss of balance or coordination, confusion or trouble speaking Infection--fever, chills, cough, sore throat, wounds that don't heal, pain or trouble when passing urine, general feeling of discomfort or being unwell Low red blood cell level--unusual   weakness or fatigue, dizziness, headache, trouble breathing Unusual bruising or bleeding Side effects that usually do not require medical attention (report to your care team if they continue or are  bothersome): Diarrhea Fatigue Nausea Vomiting This list may not describe all possible side effects. Call your doctor for medical advice about side effects. You may report side effects to FDA at 1-800-FDA-1088. Where should I keep my medication? Keep out of the reach of children and pets. Store at room temperature between 15 and 25 degrees C (59 and 77 degrees F). Protect from light. Get rid of any unused medication after the expiration date. To get rid of medications that are no longer needed or have expired: Take the medication to a medication take-back program. Check with your pharmacy or law enforcement to find a location. If you cannot return the medication, check the label or package insert to see if the medication should be thrown out in the garbage or flushed down the toilet. If you are not sure, ask your care team. If it is safe to put it in the trash, empty the medication out of the container. Mix the medication with cat litter, dirt, coffee grounds, or other unwanted substance. Seal the mixture in a bag or container. Put it in the trash. NOTE: This sheet is a summary. It may not cover all possible information. If you have questions about this medicine, talk to your doctor, pharmacist, or health care provider.  2023 Elsevier/Gold Standard (2021-12-30 00:00:00)  

## 2022-04-07 NOTE — Telephone Encounter (Signed)
FYI:  consent was obtained on Imuran today at patient's appointment, per Hazel Sams, PA-C. She will call the office if she decides to take it. Thanks!

## 2022-04-10 ENCOUNTER — Telehealth: Payer: Self-pay | Admitting: Rheumatology

## 2022-04-10 NOTE — Progress Notes (Signed)
WBC count remains elevated. Absolute lymphocytes remain elevated. Patient has history of CLL.  CMP WNL.   dsDNA remains positive-stable: 12.  ESR WNL.  UA normal.  Complements WNL.   Immunoglobulins WNL.  TSH WNL.

## 2022-04-10 NOTE — Telephone Encounter (Signed)
Patient left a voicemail stating she was returning Andrea's call regarding labwork.

## 2022-04-10 NOTE — Telephone Encounter (Signed)
See lab note for details.  

## 2022-04-11 ENCOUNTER — Telehealth: Payer: Self-pay | Admitting: Rheumatology

## 2022-04-11 NOTE — Telephone Encounter (Signed)
Patient called the office stating she was told to call when she was ready to begin taking Imuran. Patient requests that be sent to [facility] in [place].

## 2022-04-11 NOTE — Telephone Encounter (Signed)
There is no drug interaction between azathioprine and patient's current medication list including supplements.  Her food allergies will also not interact with the addition of azathioprine to her treatment regimen.  Returned call to patient to review that she can start azathioprine once labs have resulted. Patient advised that TSH wnl.  Knox Saliva, PharmD, MPH, BCPS, CPP Clinical Pharmacist (Rheumatology and Pulmonology)

## 2022-04-11 NOTE — Telephone Encounter (Signed)
Patient advised we are still waiting on some lab results prior to being able to send in prescription.   Patient states she has allergy to citrus. Patient has a past history of allergy to green beans and squash but no longer has trouble now.   Patient states she is on Vitamin D, Vitamin B-12, Calcium, tart cherry and Turmeric. Patient states she would like to know if any of her supplements are contraindicated with the Imuran.

## 2022-04-12 NOTE — Progress Notes (Signed)
Hep B and C negative. TB gold negative. Immunoglobulins WNL.

## 2022-04-13 NOTE — Progress Notes (Signed)
IFE did not reveal any monoclonal proteins.

## 2022-04-14 ENCOUNTER — Other Ambulatory Visit: Payer: Self-pay

## 2022-04-14 DIAGNOSIS — Z79899 Other long term (current) drug therapy: Secondary | ICD-10-CM

## 2022-04-14 LAB — URINALYSIS, ROUTINE W REFLEX MICROSCOPIC
Bilirubin Urine: NEGATIVE
Glucose, UA: NEGATIVE
Hgb urine dipstick: NEGATIVE
Ketones, ur: NEGATIVE
Leukocytes,Ua: NEGATIVE
Nitrite: NEGATIVE
Protein, ur: NEGATIVE
Specific Gravity, Urine: 1.013 (ref 1.001–1.035)
pH: 7 (ref 5.0–8.0)

## 2022-04-14 LAB — COMPLETE METABOLIC PANEL WITH GFR
AG Ratio: 2 (calc) (ref 1.0–2.5)
ALT: 11 U/L (ref 6–29)
AST: 15 U/L (ref 10–35)
Albumin: 4.3 g/dL (ref 3.6–5.1)
Alkaline phosphatase (APISO): 88 U/L (ref 37–153)
BUN: 14 mg/dL (ref 7–25)
CO2: 23 mmol/L (ref 20–32)
Calcium: 9.3 mg/dL (ref 8.6–10.4)
Chloride: 101 mmol/L (ref 98–110)
Creat: 0.92 mg/dL (ref 0.60–1.00)
Globulin: 2.2 g/dL (calc) (ref 1.9–3.7)
Glucose, Bld: 93 mg/dL (ref 65–99)
Potassium: 4.2 mmol/L (ref 3.5–5.3)
Sodium: 139 mmol/L (ref 135–146)
Total Bilirubin: 0.5 mg/dL (ref 0.2–1.2)
Total Protein: 6.5 g/dL (ref 6.1–8.1)
eGFR: 65 mL/min/{1.73_m2} (ref >=60)

## 2022-04-14 LAB — CBC WITH DIFFERENTIAL/PLATELET
Absolute Monocytes: 554 cells/uL (ref 200–950)
Basophils Absolute: 54 cells/uL (ref 0–200)
Basophils Relative: 0.4 %
Eosinophils Absolute: 176 cells/uL (ref 15–500)
Eosinophils Relative: 1.3 %
HCT: 39.2 % (ref 35.0–45.0)
Hemoglobin: 13.3 g/dL (ref 11.7–15.5)
Lymphs Abs: 7142 cells/uL — ABNORMAL HIGH (ref 850–3900)
MCH: 29.4 pg (ref 27.0–33.0)
MCHC: 33.9 g/dL (ref 32.0–36.0)
MCV: 86.5 fL (ref 80.0–100.0)
MPV: 9.9 fL (ref 7.5–12.5)
Monocytes Relative: 4.1 %
Neutro Abs: 5576 cells/uL (ref 1500–7800)
Neutrophils Relative %: 41.3 %
Platelets: 279 10*3/uL (ref 140–400)
RBC: 4.53 10*6/uL (ref 3.80–5.10)
RDW: 12.8 % (ref 11.0–15.0)
Total Lymphocyte: 52.9 %
WBC: 13.5 10*3/uL — ABNORMAL HIGH (ref 3.8–10.8)

## 2022-04-14 LAB — THIOPURINE METHYLTRANSFERASE (TPMT), RBC: Thiopurine Methyltransferase, RBC: 15 nmol/hr/mL RBC

## 2022-04-14 LAB — IGG, IGA, IGM
IgG (Immunoglobin G), Serum: 759 mg/dL (ref 600–1540)
IgM, Serum: 85 mg/dL (ref 50–300)
Immunoglobulin A: 223 mg/dL (ref 70–320)

## 2022-04-14 LAB — PROTEIN ELECTROPHORESIS, SERUM, WITH REFLEX
Albumin ELP: 4.1 g/dL (ref 3.8–4.8)
Alpha 1: 0.3 g/dL (ref 0.2–0.3)
Alpha 2: 0.7 g/dL (ref 0.5–0.9)
Beta 2: 0.4 g/dL (ref 0.2–0.5)
Beta Globulin: 0.5 g/dL (ref 0.4–0.6)
Gamma Globulin: 0.7 g/dL — ABNORMAL LOW (ref 0.8–1.7)
Total Protein: 6.7 g/dL (ref 6.1–8.1)

## 2022-04-14 LAB — HEPATITIS B CORE ANTIBODY, IGM: Hep B C IgM: NONREACTIVE

## 2022-04-14 LAB — QUANTIFERON-TB GOLD PLUS
Mitogen-NIL: >10 IU/mL
NIL: 0.07 IU/mL
QuantiFERON-TB Gold Plus: NEGATIVE
TB1-NIL: 0 IU/mL
TB2-NIL: <0 IU/mL

## 2022-04-14 LAB — TSH: TSH: 0.58 mIU/L (ref 0.40–4.50)

## 2022-04-14 LAB — ANTI-DNA ANTIBODY, DOUBLE-STRANDED: ds DNA Ab: 12 IU/mL — ABNORMAL HIGH

## 2022-04-14 LAB — HEPATITIS B SURFACE ANTIGEN: Hepatitis B Surface Ag: NONREACTIVE

## 2022-04-14 LAB — IFE INTERPRETATION: Immunofix Electr Int: NOT DETECTED

## 2022-04-14 LAB — C3 AND C4
C3 Complement: 166 mg/dL (ref 83–193)
C4 Complement: 35 mg/dL (ref 15–57)

## 2022-04-14 LAB — SEDIMENTATION RATE: Sed Rate: 14 mm/h (ref 0–30)

## 2022-04-14 LAB — HEPATITIS C ANTIBODY: Hepatitis C Ab: NONREACTIVE

## 2022-04-14 MED ORDER — AZATHIOPRINE 50 MG PO TABS: 50.0000 mg | ORAL_TABLET | Freq: Every day | ORAL | 2 refills | Status: DC

## 2022-04-14 NOTE — Progress Notes (Signed)
Left message for patient to advise Ok to initiate imuran 50 mg 1 tablet by mouth daily.  Recheck CBC and CMP in 2 weeks. Lab orders pended. Please review and sign prescription. Thanks!

## 2022-04-14 NOTE — Progress Notes (Signed)
Attempted to contact patient and left message for patient to call the office to schedule a sooner office visit.

## 2022-04-14 NOTE — Progress Notes (Signed)
Patient has decided that she does want to start Imuran.   Next Visit: 06/23/2022  Last Visit: 04/07/2022  DX: Autoimmune disease   Dose per office note on 04/07/2022: Patient dose will be 50 mg 1 tablet by mouth daily.    Labs: 04/07/2022 (please review)  Okay to fill Imuran?

## 2022-04-14 NOTE — Progress Notes (Signed)
Recommend sooner follow up visit in 6-8 weeks to assess response.

## 2022-04-14 NOTE — Progress Notes (Signed)
Ok to initiate imuran 50 mg 1 tablet by mouth daily.  Recheck CBC and CMP in 2 weeks.

## 2022-04-14 NOTE — Progress Notes (Signed)
TPMT WNL.

## 2022-04-17 ENCOUNTER — Telehealth: Payer: Self-pay | Admitting: Rheumatology

## 2022-04-17 NOTE — Telephone Encounter (Signed)
Azathioprine is not known to cause facial drooping or increase salivation/drooping.  She fell and hurt her lip 2 weeks ago per husband. He requested I reach out to cell. Called mobile but phone went straight to VM.  Lfet VM - advised that if she is having one-side facial drooping, she should seek emergency care as this may be sign of stroke. However if this has resolved she can seek care from her PCP.  Knox Saliva, PharmD, MPH, BCPS, CPP Clinical Pharmacist (Rheumatology and Pulmonology)

## 2022-04-17 NOTE — Telephone Encounter (Signed)
Patient called the office stating she took her first dose of Imuran Saturday. Patient states after she took it her husband said she began to droll and her face began to droop. Patient states she would like to know if that could've been caused by there Imuran or not.

## 2022-04-18 NOTE — Telephone Encounter (Signed)
Spoke with patient. She said she has reached out to PCP regarding fall. She said half of her face remains slightly swollen and attributes the drooling and facial drooping to her oral area still being numb which she herself attributes to her fall. I did review that azathioprine is unlikely to cause these symptoms.  She inquired about how long azathioprine takes to be effective. Advised that it is generally 8-12 weeks/ She states she is feeling some improvement and knot in her hand has disappeared with one dose. I did review that is great to hear but usually unexpected after just one dose. Advised that she should continue for at 2-3 months as prescribed for Dr. Estanislado Pandy to assess response.  Knox Saliva, PharmD, MPH, BCPS, CPP Clinical Pharmacist (Rheumatology and Pulmonology)

## 2022-04-21 NOTE — Telephone Encounter (Signed)
Prescription for Imuran sent 04/14/2022.

## 2022-05-01 ENCOUNTER — Other Ambulatory Visit: Payer: Self-pay | Admitting: *Deleted

## 2022-05-01 DIAGNOSIS — Z79899 Other long term (current) drug therapy: Secondary | ICD-10-CM | POA: Diagnosis not present

## 2022-05-01 NOTE — Progress Notes (Unsigned)
Office Visit Note  Patient: Jacqueline Casey             Date of Birth: 1945/12/22           MRN: [id number]             PCP: Jake Samples, PA-C Referring: Jake Samples, [place]* Visit Date: 05/02/2022 Occupation: '@GUAROCC'$ @  Subjective:  No chief complaint on file.   History of Present Illness: Jacqueline Casey is a 76 y.o. female ***   Activities of Daily Living:  Patient reports morning stiffness for *** {minute/hour:19697}.   Patient {ACTIONS;DENIES/REPORTS:21021675::"Denies"} nocturnal pain.  Difficulty dressing/grooming: {ACTIONS;DENIES/REPORTS:21021675::"Denies"} Difficulty climbing stairs: {ACTIONS;DENIES/REPORTS:21021675::"Denies"} Difficulty getting out of chair: {ACTIONS;DENIES/REPORTS:21021675::"Denies"} Difficulty using hands for taps, buttons, cutlery, and/or writing: {ACTIONS;DENIES/REPORTS:21021675::"Denies"}  No Rheumatology ROS completed.   PMFS History:  Patient Active Problem List   Diagnosis Date Noted  . CLL (chronic lymphocytic leukemia) ([place]) 12/29/2019  . Leukocytosis 12/03/2019  . Baker's cyst of knee 10/18/2011  . Knee mass 10/04/2011    Past Medical History:  Diagnosis Date  . Baker's cyst of knee 10/18/2011  . CLL (chronic lymphocytic leukemia) ([place])    per patient   . Hypertension   . Osteoarthritis   . Thyroid disease     Family History  Problem Relation Age of Onset  . Cancer Other   . Diabetes Other   . Cancer Mother        breast  . COPD Mother   . Leukemia Father   . Osteoporosis Sister   . Asthma Sister   . Dermatomyositis Sister   . COPD Sister   . Kidney disease Sister   . Cancer Son        lymphoma, colon  . Diabetes Son   . COPD Daughter   . Osteoporosis Daughter   . Rheum arthritis Daughter    Past Surgical History:  Procedure Laterality Date  . FOOT SURGERY Right 2017   hammer toe   . HAND SURGERY Left 2014   finger, hand  . SKIN TAG REMOVAL  11/07/2018   stomach and below left eye   .  THYROID SURGERY    . TUBAL LIGATION    . VAGINAL HYSTERECTOMY     Social History   Social History Narrative  . Not on file    There is no immunization history on file for this patient.   Objective: Vital Signs: There were no vitals taken for this visit.   Physical Exam   Musculoskeletal Exam: ***  CDAI Exam: CDAI Score: -- Patient Global: --; Provider Global: -- Swollen: --; Tender: -- Joint Exam 05/02/2022   No joint exam has been documented for this visit   There is currently no information documented on the homunculus. Go to the Rheumatology activity and complete the homunculus joint exam.  Investigation: No additional findings.  Imaging: No results found.  Recent Labs: Lab Results  Component Value Date   WBC 13.5 (H) 04/07/2022   HGB 13.3 04/07/2022   PLT 279 04/07/2022   NA 139 04/07/2022   K 4.2 04/07/2022   CL 101 04/07/2022   CO2 23 04/07/2022   GLUCOSE 93 04/07/2022   BUN 14 04/07/2022   CREATININE 0.92 04/07/2022   BILITOT 0.5 04/07/2022   ALKPHOS 81 10/26/2021   AST 15 04/07/2022   ALT 11 04/07/2022   PROT 6.5 04/07/2022   PROT 6.7 04/07/2022   ALBUMIN 4.2 10/26/2021   CALCIUM 9.3 04/07/2022   GFRAA 45 (L) 03/29/2020  QFTBGOLDPLUS NEGATIVE 04/07/2022    Speciality Comments: PLQ eye exam: 03/06/2018 normal. Dr. Marvel Plan. Follow up in 6 months.  Procedures:  No procedures performed Allergies: Sulfa antibiotics   Assessment / Plan:     Visit Diagnoses: No diagnosis found.  Orders: No orders of the defined types were placed in this encounter.  No orders of the defined types were placed in this encounter.   Face-to-face time spent with patient was *** minutes. Greater than 50% of time was spent in counseling and coordination of care.  Follow-Up Instructions: No follow-ups on file.   Earnestine Mealing, CMA  Note - This record has been created using Editor, commissioning.  Chart creation errors have been sought, but may not always   have been located. Such creation errors do not reflect on  the standard of medical care.

## 2022-05-02 ENCOUNTER — Encounter: Payer: Self-pay | Admitting: Physician Assistant

## 2022-05-02 ENCOUNTER — Ambulatory Visit: Payer: Medicare HMO | Attending: Physician Assistant | Admitting: Physician Assistant

## 2022-05-02 VITALS — BP 122/77 | HR 86 | Ht 68.5 in | Wt 238.8 lb

## 2022-05-02 DIAGNOSIS — M19042 Primary osteoarthritis, left hand: Secondary | ICD-10-CM | POA: Diagnosis not present

## 2022-05-02 DIAGNOSIS — C9110 Chronic lymphocytic leukemia of B-cell type not having achieved remission: Secondary | ICD-10-CM

## 2022-05-02 DIAGNOSIS — M810 Age-related osteoporosis without current pathological fracture: Secondary | ICD-10-CM | POA: Diagnosis not present

## 2022-05-02 DIAGNOSIS — Z8639 Personal history of other endocrine, nutritional and metabolic disease: Secondary | ICD-10-CM | POA: Diagnosis not present

## 2022-05-02 DIAGNOSIS — L920 Granuloma annulare: Secondary | ICD-10-CM

## 2022-05-02 DIAGNOSIS — M7122 Synovial cyst of popliteal space [Baker], left knee: Secondary | ICD-10-CM

## 2022-05-02 DIAGNOSIS — Z8679 Personal history of other diseases of the circulatory system: Secondary | ICD-10-CM | POA: Diagnosis not present

## 2022-05-02 DIAGNOSIS — M19041 Primary osteoarthritis, right hand: Secondary | ICD-10-CM | POA: Diagnosis not present

## 2022-05-02 DIAGNOSIS — M359 Systemic involvement of connective tissue, unspecified: Secondary | ICD-10-CM

## 2022-05-02 DIAGNOSIS — Z79899 Other long term (current) drug therapy: Secondary | ICD-10-CM | POA: Diagnosis not present

## 2022-05-02 DIAGNOSIS — R110 Nausea: Secondary | ICD-10-CM

## 2022-05-02 DIAGNOSIS — M7121 Synovial cyst of popliteal space [Baker], right knee: Secondary | ICD-10-CM | POA: Diagnosis not present

## 2022-05-02 LAB — COMPLETE METABOLIC PANEL WITH GFR
AG Ratio: 1.9 (calc) (ref 1.0–2.5)
ALT: 40 U/L — ABNORMAL HIGH (ref 6–29)
AST: 56 U/L — ABNORMAL HIGH (ref 10–35)
Albumin: 4.4 g/dL (ref 3.6–5.1)
Alkaline phosphatase (APISO): 91 U/L (ref 37–153)
BUN: 15 mg/dL (ref 7–25)
CO2: 28 mmol/L (ref 20–32)
Calcium: 9.2 mg/dL (ref 8.6–10.4)
Chloride: 98 mmol/L (ref 98–110)
Creat: 0.93 mg/dL (ref 0.60–1.00)
Globulin: 2.3 g/dL (calc) (ref 1.9–3.7)
Glucose, Bld: 104 mg/dL (ref 65–139)
Potassium: 3.6 mmol/L (ref 3.5–5.3)
Sodium: 137 mmol/L (ref 135–146)
Total Bilirubin: 0.6 mg/dL (ref 0.2–1.2)
Total Protein: 6.7 g/dL (ref 6.1–8.1)
eGFR: 64 mL/min/{1.73_m2} (ref >=60)

## 2022-05-02 LAB — CBC WITH DIFFERENTIAL/PLATELET
Absolute Monocytes: 548 cells/uL (ref 200–950)
Basophils Absolute: 27 cells/uL (ref 0–200)
Basophils Relative: 0.2 %
Eosinophils Absolute: 137 cells/uL (ref 15–500)
Eosinophils Relative: 1 %
HCT: 39.7 % (ref 35.0–45.0)
Hemoglobin: 13.7 g/dL (ref 11.7–15.5)
Lymphs Abs: 3905 cells/uL — ABNORMAL HIGH (ref 850–3900)
MCH: 29.5 pg (ref 27.0–33.0)
MCHC: 34.5 g/dL (ref 32.0–36.0)
MCV: 85.4 fL (ref 80.0–100.0)
MPV: 10.2 fL (ref 7.5–12.5)
Monocytes Relative: 4 %
Neutro Abs: 9083 cells/uL — ABNORMAL HIGH (ref 1500–7800)
Neutrophils Relative %: 66.3 %
Platelets: 263 10*3/uL (ref 140–400)
RBC: 4.65 10*6/uL (ref 3.80–5.10)
RDW: 12.8 % (ref 11.0–15.0)
Total Lymphocyte: 28.5 %
WBC: 13.7 10*3/uL — ABNORMAL HIGH (ref 3.8–10.8)

## 2022-05-02 NOTE — Progress Notes (Signed)
Discussed results with the patient today in the office.  Advised to discontinue imuran and have updated lab work in 2 weeks.

## 2022-05-03 LAB — LIPASE: Lipase: 26 U/L (ref 7–60)

## 2022-05-03 LAB — AMYLASE: Amylase: 41 U/L (ref 21–101)

## 2022-05-03 NOTE — Progress Notes (Signed)
Amylase and lipase are WNL.

## 2022-05-08 ENCOUNTER — Telehealth: Payer: Self-pay | Admitting: Rheumatology

## 2022-05-08 NOTE — Telephone Encounter (Signed)
Patient left a voicemail requesting her most recent labs be sent to her PCP, Delman Cheadle with [facility].

## 2022-05-08 NOTE — Telephone Encounter (Signed)
Most recent labs from 05/01/2022 and 05/02/2022 have been faxed to patient's PCP.

## 2022-05-09 ENCOUNTER — Telehealth: Payer: Self-pay | Admitting: Rheumatology

## 2022-05-09 DIAGNOSIS — R590 Localized enlarged lymph nodes: Secondary | ICD-10-CM | POA: Diagnosis not present

## 2022-05-09 DIAGNOSIS — J029 Acute pharyngitis, unspecified: Secondary | ICD-10-CM | POA: Diagnosis not present

## 2022-05-09 NOTE — Telephone Encounter (Signed)
Patient stopped by the office this morning. Patient states she is having some discomfort with her throat and potential swelling. Patient has not been on Imuran for over a week. Patient advised to see her PCP or Urgent care to rule out infection. Patient expressed understanding.

## 2022-05-09 NOTE — Telephone Encounter (Signed)
Patient called the office stating she called yesterday about her throat swelling and its worse this morning. Patient would like to see Lovena Le for this. Patient states she hasn't taken her medication in 7 days. Please advise.

## 2022-05-11 DIAGNOSIS — E6609 Other obesity due to excess calories: Secondary | ICD-10-CM | POA: Diagnosis not present

## 2022-05-11 DIAGNOSIS — M069 Rheumatoid arthritis, unspecified: Secondary | ICD-10-CM | POA: Diagnosis not present

## 2022-05-11 DIAGNOSIS — Z6836 Body mass index (BMI) 36.0-36.9, adult: Secondary | ICD-10-CM | POA: Diagnosis not present

## 2022-05-11 DIAGNOSIS — B085 Enteroviral vesicular pharyngitis: Secondary | ICD-10-CM | POA: Diagnosis not present

## 2022-05-15 ENCOUNTER — Other Ambulatory Visit: Payer: Medicare HMO

## 2022-05-15 NOTE — Progress Notes (Signed)
Office Visit Note  Patient: Jacqueline Casey             Date of Birth: 1946/08/07           MRN: [id number]             PCP: Avis Epley, PA-C Referring: Avis Epley, [place]* Visit Date: 05/29/2022 Occupation: @GUAROCC @  Subjective:  Medication monitoring   History of Present Illness: Jacqueline Casey is a 76 y.o. female with history of autoimmune disease and osteoarthritis.  Patient is not currently taking any immunosuppressive agents.  Patient previously took Imuran for 17 days but discontinued due to experiencing episodic nausea and vomiting while taking Imuran.  She has not had any ongoing nausea, abdominal pain, or vomiting since discontinuing therapy.  She states that she recently had a sore throat and was evaluated by her PCP.  According to the patient she was prescribed a Z-Pak which has improved her symptoms.  She also received a IM steroid injection which helped to reduce the lymph node swelling and pain in her throat.  She denies any other infections lately. Patient was asking if she would be able to restart on Imuran since she had noticed so much clinical benefit while taking it.  While taking Imuran she has had less joint pain, joint swelling, and joint stiffness.  Activities of Daily Living:  Patient reports morning stiffness for 1 hour.   Patient Denies nocturnal pain.  Difficulty dressing/grooming: Reports Difficulty climbing stairs: Reports Difficulty getting out of chair: Reports Difficulty using hands for taps, buttons, cutlery, and/or writing: Reports  Review of Systems  Constitutional:  Positive for fatigue.  HENT:  Positive for mouth dryness. Negative for mouth sores and nose dryness.   Eyes:  Positive for dryness. Negative for pain and visual disturbance.  Respiratory:  Negative for cough, hemoptysis, shortness of breath and difficulty breathing.   Cardiovascular:  Negative for chest pain, palpitations, hypertension and swelling in legs/feet.   Gastrointestinal:  Negative for blood in stool, constipation and diarrhea.  Endocrine: Negative for increased urination.  Genitourinary:  Negative for painful urination and involuntary urination.  Musculoskeletal:  Positive for joint pain, gait problem, joint pain, joint swelling, myalgias, morning stiffness, muscle tenderness and myalgias. Negative for muscle weakness.  Skin:  Positive for sensitivity to sunlight. Negative for color change, pallor, rash, hair loss, nodules/bumps, skin tightness and ulcers.  Allergic/Immunologic: Negative for susceptible to infections.  Neurological:  Negative for dizziness, numbness, headaches and weakness.  Hematological:  Positive for swollen glands.  Psychiatric/Behavioral:  Negative for depressed mood and sleep disturbance. The patient is not nervous/anxious.     PMFS History:  Patient Active Problem List   Diagnosis Date Noted   CLL (chronic lymphocytic leukemia) ([facility]) 12/29/2019   Leukocytosis 12/03/2019   Baker's cyst of knee 10/18/2011   Knee mass 10/04/2011    Past Medical History:  Diagnosis Date   Baker's cyst of knee 10/18/2011   CLL (chronic lymphocytic leukemia) ([facility])    per patient    Hypertension    Osteoarthritis    Thyroid disease     Family History  Problem Relation Age of Onset   Cancer Other    Diabetes Other    Cancer Mother        breast   COPD Mother    Leukemia Father    Osteoporosis Sister    Asthma Sister    Dermatomyositis Sister    COPD Sister    Kidney disease Sister  Cancer Son        lymphoma, colon   Diabetes Son    COPD Daughter    Osteoporosis Daughter    Rheum arthritis Daughter    Past Surgical History:  Procedure Laterality Date   FOOT SURGERY Right 2017   hammer toe    HAND SURGERY Left 2014   finger, hand   SKIN TAG REMOVAL  11/07/2018   stomach and below left eye    THYROID SURGERY     TUBAL LIGATION     VAGINAL HYSTERECTOMY     Social History   Social History Narrative   Not  on file    There is no immunization history on file for this patient.   Objective: Vital Signs: BP 132/86 (BP Location: Left Arm, Patient Position: Sitting, Cuff Size: Large)   Pulse 79   Resp 17   Ht 5' 8.5" (1.74 m)   Wt 239 lb 6.4 oz (108.6 kg)   BMI 35.87 kg/m    Physical Exam Vitals and nursing note reviewed.  Constitutional:      Appearance: She is well-developed.  HENT:     Head: Normocephalic and atraumatic.  Eyes:     Conjunctiva/sclera: Conjunctivae normal.  Cardiovascular:     Rate and Rhythm: Normal rate and regular rhythm.     Heart sounds: Normal heart sounds.  Pulmonary:     Effort: Pulmonary effort is normal.     Breath sounds: Normal breath sounds.  Abdominal:     General: Bowel sounds are normal.     Palpations: Abdomen is soft.  Musculoskeletal:     Cervical back: Normal range of motion.  Skin:    General: Skin is warm and dry.     Capillary Refill: Capillary refill takes less than 2 seconds.  Neurological:     Mental Status: She is alert and oriented to person, place, and time.  Psychiatric:        Behavior: Behavior normal.      Musculoskeletal Exam: C-spine has good range of motion.  Shoulder joints and elbow joints have good range of motion with no discomfort.  Right wrist has slightly limited extension with synovial thickening but no tenderness or synovitis.  Left wrist has good range of motion with no tenderness or synovitis.  PIP and DIP thickening consistent with osteoarthritis of both hands.  CMC joint prominence noted bilaterally.  Hip joints have good range of motion with no groin pain.  Knee joints have good range of motion with no warmth or effusion.  Baker's cyst palpable behind both knees.  Ankle joints have good range of motion with no tenderness or joint swelling.  CDAI Exam: CDAI Score: -- Patient Global: --; Provider Global: -- Swollen: --; Tender: -- Joint Exam 05/29/2022   No joint exam has been documented for this visit    There is currently no information documented on the homunculus. Go to the Rheumatology activity and complete the homunculus joint exam.  Investigation: No additional findings.  Imaging: No results found.  Recent Labs: Lab Results  Component Value Date   WBC 13.7 (H) 05/01/2022   HGB 13.7 05/01/2022   PLT 263 05/01/2022   NA 137 05/01/2022   K 3.6 05/01/2022   CL 98 05/01/2022   CO2 28 05/01/2022   GLUCOSE 104 05/01/2022   BUN 15 05/01/2022   CREATININE 0.93 05/01/2022   BILITOT 0.6 05/01/2022   ALKPHOS 81 10/26/2021   AST 56 (H) 05/01/2022   ALT 40 (H) 05/01/2022  PROT 6.7 05/01/2022   ALBUMIN 4.2 10/26/2021   CALCIUM 9.2 05/01/2022   GFRAA 45 (L) 03/29/2020   QFTBGOLDPLUS NEGATIVE 04/07/2022    Speciality Comments: PLQ eye exam: 03/06/2018 normal. Dr. Senaida Ores. Follow up in 6 months.  Procedures:  No procedures performed Allergies: Sulfa antibiotics   Assessment / Plan:     Visit Diagnoses: Autoimmune disease (HCC) - ANA 1:160 NH, dsDNA+, inflammatory arthritis. X-rays obtained in 2019 showed MCP narrowing consistent with inflammatory arthritis: 04/07/2022: Double-stranded DNA negative, complements within normal limits, ESR within normal limits, no proteinuria.  She is not exhibiting any signs or symptoms of a systemic autoimmune disease flare at this time. Patient took Imuran for 17 days but discontinued due to nausea, vomiting, and a mild serum aminotransferase elevation.  Amylase and lipase were within normal limits on 05/02/2022.  Hepatic function panel will be rechecked today.  The patient had noticed significant improvement in her joint pain, stiffness, and joint swelling while taking Imuran.  Discussed that I would advise avoiding the use of Imuran in the future due to the risk for pancreatitis as well as elevation in LFTs.   In the past she had an intolerance to Plaquenil.The patient would like to hold off on adding any other medications at this time.  Discussed  that in the future a possible treatment option could be low-dose Arava if her LFTs returned to within normal limits to help manage inflammatory arthritis.  The patient will follow-up in the office in 3 months or sooner if needed and we can further discuss treatment options at that time.  High risk medication use - Patient is not currently taking any immunosuppressive agents.  Previous therapy: Imuran 50 mg 1 tablet by mouth daily-Pt took imuran for 17 days and discontinued due to nausea and vomiting.  Plaquenil-d/c due to intolerance.   Hepatic function panel ordered today.    LFTs abnormal -Hepatic function panel will be updated today.  Plan: Hepatic function panel  Primary osteoarthritis of both hands: CMC joint prominence and thickening noted bilaterally.  PIP and DIP thickening consistent with osteoarthritis of both hands.  No active synovitis on examination today.  Discussed the importance of joint protection and muscle strengthening.  Synovial cyst of right popliteal space: Unchanged.  Synovial cyst of left knee: Unchanged.  No tenderness upon palpation.  Granuloma annulare - She is followed by dermatology.   Age-related osteoporosis without current pathological fracture - DEXA on 10/28/20: The BMD measured at Femur Neck Left is 0.697 g/cm2 with a T-score of -2.5. Patient has declined treatment.  Next DEXA will be due March 2024.  Other medical conditions are listed as follows:  CLL (chronic lymphocytic leukemia) ([facility]) - She was diagnosed with CLL and has been followed closely at [facility].  Upcoming appointment with oncology on 06/20/2022.  History of hypertension: Blood pressure was 132/86 today in the office.  History of hypothyroidism: She remains on Synthroid as prescribed.    Orders: Orders Placed This Encounter  Procedures   Hepatic function panel   No orders of the defined types were placed in this encounter.   Follow-Up Instructions: Return in about  3 months (around 08/29/2022) for Autoimmune Disease, Osteoarthritis.   Gearldine Bienenstock, PA-C  Note - This record has been created using Dragon software.  Chart creation errors have been sought, but may not always  have been located. Such creation errors do not reflect on  the standard of medical care.

## 2022-05-22 ENCOUNTER — Ambulatory Visit: Payer: Medicare HMO | Admitting: Physician Assistant

## 2022-05-22 ENCOUNTER — Ambulatory Visit: Payer: Medicare HMO | Admitting: Hematology

## 2022-05-29 ENCOUNTER — Encounter: Payer: Self-pay | Admitting: Physician Assistant

## 2022-05-29 ENCOUNTER — Ambulatory Visit: Payer: Medicare HMO | Attending: Physician Assistant | Admitting: Physician Assistant

## 2022-05-29 VITALS — BP 132/86 | HR 79 | Resp 17 | Ht 68.5 in | Wt 239.4 lb

## 2022-05-29 DIAGNOSIS — M19042 Primary osteoarthritis, left hand: Secondary | ICD-10-CM | POA: Diagnosis not present

## 2022-05-29 DIAGNOSIS — M19041 Primary osteoarthritis, right hand: Secondary | ICD-10-CM

## 2022-05-29 DIAGNOSIS — C9110 Chronic lymphocytic leukemia of B-cell type not having achieved remission: Secondary | ICD-10-CM

## 2022-05-29 DIAGNOSIS — Z8639 Personal history of other endocrine, nutritional and metabolic disease: Secondary | ICD-10-CM

## 2022-05-29 DIAGNOSIS — M359 Systemic involvement of connective tissue, unspecified: Secondary | ICD-10-CM | POA: Diagnosis not present

## 2022-05-29 DIAGNOSIS — Z8679 Personal history of other diseases of the circulatory system: Secondary | ICD-10-CM | POA: Diagnosis not present

## 2022-05-29 DIAGNOSIS — Z79899 Other long term (current) drug therapy: Secondary | ICD-10-CM | POA: Diagnosis not present

## 2022-05-29 DIAGNOSIS — L920 Granuloma annulare: Secondary | ICD-10-CM

## 2022-05-29 DIAGNOSIS — R7989 Other specified abnormal findings of blood chemistry: Secondary | ICD-10-CM | POA: Diagnosis not present

## 2022-05-29 DIAGNOSIS — M7121 Synovial cyst of popliteal space [Baker], right knee: Secondary | ICD-10-CM

## 2022-05-29 DIAGNOSIS — M810 Age-related osteoporosis without current pathological fracture: Secondary | ICD-10-CM

## 2022-05-29 DIAGNOSIS — M7122 Synovial cyst of popliteal space [Baker], left knee: Secondary | ICD-10-CM

## 2022-05-29 LAB — HEPATIC FUNCTION PANEL
AG Ratio: 1.7 (calc) (ref 1.0–2.5)
ALT: 12 U/L (ref 6–29)
AST: 16 U/L (ref 10–35)
Albumin: 4.3 g/dL (ref 3.6–5.1)
Alkaline phosphatase (APISO): 97 U/L (ref 37–153)
Bilirubin, Direct: 0.1 mg/dL (ref 0.0–0.2)
Globulin: 2.6 g/dL (calc) (ref 1.9–3.7)
Indirect Bilirubin: 0.6 mg/dL (calc) (ref 0.2–1.2)
Total Bilirubin: 0.7 mg/dL (ref 0.2–1.2)
Total Protein: 6.9 g/dL (ref 6.1–8.1)

## 2022-05-29 NOTE — Progress Notes (Signed)
Hepatic function panel WNL

## 2022-06-13 ENCOUNTER — Inpatient Hospital Stay: Payer: Medicare HMO | Attending: Hematology

## 2022-06-13 DIAGNOSIS — E559 Vitamin D deficiency, unspecified: Secondary | ICD-10-CM | POA: Insufficient documentation

## 2022-06-13 DIAGNOSIS — Z800 Family history of malignant neoplasm of digestive organs: Secondary | ICD-10-CM | POA: Diagnosis not present

## 2022-06-13 DIAGNOSIS — Z803 Family history of malignant neoplasm of breast: Secondary | ICD-10-CM | POA: Insufficient documentation

## 2022-06-13 DIAGNOSIS — M069 Rheumatoid arthritis, unspecified: Secondary | ICD-10-CM | POA: Diagnosis not present

## 2022-06-13 DIAGNOSIS — Z807 Family history of other malignant neoplasms of lymphoid, hematopoietic and related tissues: Secondary | ICD-10-CM | POA: Insufficient documentation

## 2022-06-13 DIAGNOSIS — C9110 Chronic lymphocytic leukemia of B-cell type not having achieved remission: Secondary | ICD-10-CM | POA: Insufficient documentation

## 2022-06-13 DIAGNOSIS — Z806 Family history of leukemia: Secondary | ICD-10-CM | POA: Insufficient documentation

## 2022-06-13 DIAGNOSIS — E538 Deficiency of other specified B group vitamins: Secondary | ICD-10-CM | POA: Diagnosis not present

## 2022-06-13 DIAGNOSIS — I10 Essential (primary) hypertension: Secondary | ICD-10-CM | POA: Diagnosis not present

## 2022-06-13 DIAGNOSIS — M8580 Other specified disorders of bone density and structure, unspecified site: Secondary | ICD-10-CM | POA: Diagnosis not present

## 2022-06-13 DIAGNOSIS — Z87891 Personal history of nicotine dependence: Secondary | ICD-10-CM | POA: Insufficient documentation

## 2022-06-13 LAB — CBC WITH DIFFERENTIAL/PLATELET
Abs Immature Granulocytes: 0.04 10*3/uL (ref 0.00–0.07)
Basophils Absolute: 0.1 10*3/uL (ref 0.0–0.1)
Basophils Relative: 1 %
Eosinophils Absolute: 0.2 10*3/uL (ref 0.0–0.5)
Eosinophils Relative: 2 %
HCT: 38 % (ref 36.0–46.0)
Hemoglobin: 12.5 g/dL (ref 12.0–15.0)
Immature Granulocytes: 0 %
Lymphocytes Relative: 49 %
Lymphs Abs: 6.7 10*3/uL — ABNORMAL HIGH (ref 0.7–4.0)
MCH: 29.5 pg (ref 26.0–34.0)
MCHC: 32.9 g/dL (ref 30.0–36.0)
MCV: 89.6 fL (ref 80.0–100.0)
Monocytes Absolute: 0.7 10*3/uL (ref 0.1–1.0)
Monocytes Relative: 5 %
Neutro Abs: 5.8 10*3/uL (ref 1.7–7.7)
Neutrophils Relative %: 43 %
Platelets: 262 10*3/uL (ref 150–400)
RBC: 4.24 MIL/uL (ref 3.87–5.11)
RDW: 13.2 % (ref 11.5–15.5)
WBC: 13.5 10*3/uL — ABNORMAL HIGH (ref 4.0–10.5)
nRBC: 0 % (ref 0.0–0.2)

## 2022-06-13 LAB — COMPREHENSIVE METABOLIC PANEL
ALT: 15 U/L (ref 0–44)
AST: 19 U/L (ref 15–41)
Albumin: 4.1 g/dL (ref 3.5–5.0)
Alkaline Phosphatase: 80 U/L (ref 38–126)
Anion gap: 8 (ref 5–15)
BUN: 18 mg/dL (ref 8–23)
CO2: 28 mmol/L (ref 22–32)
Calcium: 9.2 mg/dL (ref 8.9–10.3)
Chloride: 104 mmol/L (ref 98–111)
Creatinine, Ser: 1.01 mg/dL — ABNORMAL HIGH (ref 0.44–1.00)
GFR, Estimated: 58 mL/min — ABNORMAL LOW (ref >60)
Glucose, Bld: 110 mg/dL — ABNORMAL HIGH (ref 70–99)
Potassium: 4 mmol/L (ref 3.5–5.1)
Sodium: 140 mmol/L (ref 135–145)
Total Bilirubin: 0.7 mg/dL (ref 0.3–1.2)
Total Protein: 7 g/dL (ref 6.5–8.1)

## 2022-06-13 LAB — VITAMIN D 25 HYDROXY (VIT D DEFICIENCY, FRACTURES): Vit D, 25-Hydroxy: 43.65 ng/mL (ref 30–100)

## 2022-06-13 LAB — LACTATE DEHYDROGENASE: LDH: 155 U/L (ref 98–192)

## 2022-06-20 ENCOUNTER — Inpatient Hospital Stay (HOSPITAL_BASED_OUTPATIENT_CLINIC_OR_DEPARTMENT_OTHER): Payer: Medicare HMO | Admitting: Hematology

## 2022-06-20 VITALS — BP 151/87 | HR 75 | Temp 98.0°F | Resp 17 | Ht 68.5 in | Wt 239.2 lb

## 2022-06-20 DIAGNOSIS — E559 Vitamin D deficiency, unspecified: Secondary | ICD-10-CM | POA: Diagnosis not present

## 2022-06-20 DIAGNOSIS — E538 Deficiency of other specified B group vitamins: Secondary | ICD-10-CM | POA: Diagnosis not present

## 2022-06-20 DIAGNOSIS — C9110 Chronic lymphocytic leukemia of B-cell type not having achieved remission: Secondary | ICD-10-CM

## 2022-06-20 DIAGNOSIS — M8580 Other specified disorders of bone density and structure, unspecified site: Secondary | ICD-10-CM | POA: Diagnosis not present

## 2022-06-20 DIAGNOSIS — Z807 Family history of other malignant neoplasms of lymphoid, hematopoietic and related tissues: Secondary | ICD-10-CM | POA: Diagnosis not present

## 2022-06-20 DIAGNOSIS — M069 Rheumatoid arthritis, unspecified: Secondary | ICD-10-CM | POA: Diagnosis not present

## 2022-06-20 DIAGNOSIS — Z806 Family history of leukemia: Secondary | ICD-10-CM | POA: Diagnosis not present

## 2022-06-20 DIAGNOSIS — Z87891 Personal history of nicotine dependence: Secondary | ICD-10-CM | POA: Diagnosis not present

## 2022-06-20 DIAGNOSIS — Z803 Family history of malignant neoplasm of breast: Secondary | ICD-10-CM | POA: Diagnosis not present

## 2022-06-20 DIAGNOSIS — Z800 Family history of malignant neoplasm of digestive organs: Secondary | ICD-10-CM | POA: Diagnosis not present

## 2022-06-20 DIAGNOSIS — I10 Essential (primary) hypertension: Secondary | ICD-10-CM | POA: Diagnosis not present

## 2022-06-20 NOTE — Patient Instructions (Addendum)
Jacqueline Casey  Discharge Instructions  You were seen and examined today by Dr. Delton Coombes.  Dr. Delton Coombes discussed your most recent lab work which revealed that everything looks good and stable.  Follow-up as scheduled in 6 months with labs.    Thank you for choosing [facility] to provide your oncology and hematology care.   To afford each patient quality time with our provider, please arrive at least 15 minutes before your scheduled appointment time. You may need to reschedule your appointment if you arrive late (10 or more minutes). Arriving late affects you and other patients whose appointments are after yours.  Also, if you miss three or more appointments without notifying the office, you may be dismissed from the clinic at the provider's discretion.    Again, thank you for choosing [facility].  Our hope is that these requests will decrease the amount of time that you wait before being seen by our physicians.   If you have a lab appointment with the [facility] please come in thru the Main Entrance and check in at the main information desk.           _____________________________________________________________  Should you have questions after your visit to [facility], please contact our office at [phone] and follow the prompts.  Our office hours are 8:00 a.m. to 4:30 p.m. Monday - Thursday and 8:00 a.m. to 2:30 p.m. Friday.  Please note that voicemails left after 4:00 p.m. may not be returned until the following business day.  We are closed weekends and all major holidays.  You do have access to a nurse 24-7, just call the main number to the clinic [phone] and do not press any options, hold on the line and a nurse will answer the phone.    For prescription refill requests, have your pharmacy contact our office and allow 72 hours.    Masks are optional in the cancer centers. If you would  like for your care team to wear a mask while they are taking care of you, please let them know. You may have one support person who is at least 75 years old accompany you for your appointments.

## 2022-06-20 NOTE — Progress Notes (Signed)
[place], [place] [address]   CLINIC:  Medical Oncology/Hematology  PCP:  Scherrie Bateman [address] / [place] [address] [phone]   REASON FOR VISIT:  Follow-up for CLL  PRIOR THERAPY: none  NGS Results: not done  CURRENT THERAPY: surveillance  BRIEF ONCOLOGIC HISTORY:  Oncology History   No history exists.    CANCER STAGING:  Cancer Staging  No matching staging information was found for the patient.  INTERVAL HISTORY:  Jacqueline Casey, a 76 y.o. female, seen for follow-up of CLL.  Denies any infections in the last 6 months.  Denies any fevers, night sweats or weight loss.  Denies any palpable or painful lymph nodes.  REVIEW OF SYSTEMS:  Review of Systems  Respiratory:  Positive for cough.   Cardiovascular:  Positive for chest pain.  All other systems reviewed and are negative.   PAST MEDICAL/SURGICAL HISTORY:  Past Medical History:  Diagnosis Date   Baker's cyst of knee 10/18/2011   CLL (chronic lymphocytic leukemia) ([place])    per patient    Hypertension    Osteoarthritis    Thyroid disease    Past Surgical History:  Procedure Laterality Date   FOOT SURGERY Right 2017   hammer toe    HAND SURGERY Left 2014   finger, hand   SKIN TAG REMOVAL  11/07/2018   stomach and below left eye    THYROID SURGERY     TUBAL LIGATION     VAGINAL HYSTERECTOMY      SOCIAL HISTORY:  Social History   Socioeconomic History   Marital status: Married    Spouse name: Not on file   Number of children: 3   Years of education: 12   Highest education level: Not on file  Occupational History   Not on file  Tobacco Use   Smoking status: Former    Packs/day: 1.50    Years: 15.00    Total pack years: 22.50    Types: Cigarettes    Quit date: 07/1999    Years since quitting: 22.9    Passive exposure: Never   Smokeless tobacco: Never  Vaping Use   Vaping Use: Never used  Substance and Sexual  Activity   Alcohol use: No   Drug use: No   Sexual activity: Not on file  Other Topics Concern   Not on file  Social History Narrative   Not on file   Social Determinants of Health   Financial Resource Strain: Not on file  Food Insecurity: Not on file  Transportation Needs: No Transportation Needs (10/11/2020)   PRAPARE - Transportation    Lack of Transportation (Medical): No    Lack of Transportation (Non-Medical): No  Physical Activity: Inactive (10/11/2020)   Exercise Vital Sign    Days of Exercise per Week: 0 days    Minutes of Exercise per Session: 0 min  Stress: Not on file  Social Connections: Not on file  Intimate Partner Violence: Not At Risk (10/11/2020)   Humiliation, Afraid, Rape, and Kick questionnaire    Fear of Current or Ex-Partner: No    Emotionally Abused: No    Physically Abused: No    Sexually Abused: No    FAMILY HISTORY:  Family History  Problem Relation Age of Onset   Cancer Other    Diabetes Other    Cancer Mother        breast   COPD Mother    Leukemia Father  Osteoporosis Sister    Asthma Sister    Dermatomyositis Sister    COPD Sister    Kidney disease Sister    Cancer Son        lymphoma, colon   Diabetes Son    COPD Daughter    Osteoporosis Daughter    Rheum arthritis Daughter     CURRENT MEDICATIONS:  Current Outpatient Medications  Medication Sig Dispense Refill   acetaminophen (TYLENOL) 650 MG CR tablet Take 650 mg by mouth as needed for pain.     azaTHIOprine (IMURAN) 50 MG tablet Take 1 tablet (50 mg total) by mouth daily. 30 tablet 2   b complex vitamins capsule Take 1 capsule by mouth daily.     CALCIUM PO Take by mouth daily.     diclofenac sodium (VOLTAREN) 1 % GEL Apply 3 grams to 3 large joints up to 3 times daily. 3 Tube 3   fluticasone (CUTIVATE) 0.05 % cream Apply topically 2 (two) times daily as needed.     hydrochlorothiazide (HYDRODIURIL) 12.5 MG tablet Take 12.5 mg by mouth daily.     ibuprofen (ADVIL,MOTRIN)  200 MG tablet Take 200 mg by mouth every 6 (six) hours as needed.     ketoconazole (NIZORAL) 2 % cream Apply topically 2 (two) times daily as needed.     levothyroxine (SYNTHROID, LEVOTHROID) 88 MCG tablet Take 88 mcg by mouth daily.     TART CHERRY PO Take by mouth.     triamcinolone cream (KENALOG) 0.1 % as needed.  3   TURMERIC CURCUMIN PO Take by mouth.     vitamin B-12 (CYANOCOBALAMIN) 500 MCG tablet Take 500 mcg by mouth 2 (two) times daily.     VITAMIN D PO Take by mouth daily.     No current facility-administered medications for this visit.    ALLERGIES:  Allergies  Allergen Reactions   Sulfa Antibiotics     PHYSICAL EXAM:  Performance status (ECOG): 1 - Symptomatic but completely ambulatory  Vitals:   06/20/22 1515  BP: (!) 151/87  Pulse: 75  Resp: 17  Temp: 98 F (36.7 C)  SpO2: 95%   Wt Readings from Last 3 Encounters:  06/20/22 239 lb 3.2 oz (108.5 kg)  05/29/22 239 lb 6.4 oz (108.6 kg)  05/02/22 238 lb 12.8 oz (108.3 kg)   Physical Exam Vitals reviewed.  Constitutional:      Appearance: Normal appearance. She is obese.  Cardiovascular:     Rate and Rhythm: Normal rate and regular rhythm.     Pulses: Normal pulses.     Heart sounds: Normal heart sounds.  Pulmonary:     Effort: Pulmonary effort is normal.     Breath sounds: Normal breath sounds.  Abdominal:     Palpations: Abdomen is soft. There is no hepatomegaly, splenomegaly or mass.     Tenderness: There is no abdominal tenderness.  Lymphadenopathy:     Cervical: No cervical adenopathy.     Right cervical: No superficial cervical adenopathy.    Left cervical: No superficial cervical adenopathy.     Upper Body:     Right upper body: No supraclavicular or axillary adenopathy.     Left upper body: No supraclavicular or axillary adenopathy.     Lower Body: No right inguinal adenopathy. No left inguinal adenopathy.  Neurological:     General: No focal deficit present.     Mental Status: She is  alert and oriented to person, place, and time.  Psychiatric:  Mood and Affect: Mood normal.        Behavior: Behavior normal.      LABORATORY DATA:  I have reviewed the labs as listed.     Latest Ref Rng & Units 06/13/2022    1:17 PM 05/01/2022    2:51 PM 04/07/2022   10:01 AM  CBC  WBC 4.0 - 10.5 K/uL 13.5  13.7  13.5   Hemoglobin 12.0 - 15.0 g/dL 12.5  13.7  13.3   Hematocrit 36.0 - 46.0 % 38.0  39.7  39.2   Platelets 150 - 400 K/uL 262  263  279       Latest Ref Rng & Units 06/13/2022    1:17 PM 05/29/2022    9:46 AM 05/01/2022    2:51 PM  CMP  Glucose 70 - 99 mg/dL 110   104   BUN 8 - 23 mg/dL 18   15   Creatinine 0.44 - 1.00 mg/dL 1.01   0.93   Sodium 135 - 145 mmol/L 140   137   Potassium 3.5 - 5.1 mmol/L 4.0   3.6   Chloride 98 - 111 mmol/L 104   98   CO2 22 - 32 mmol/L 28   28   Calcium 8.9 - 10.3 mg/dL 9.2   9.2   Total Protein 6.5 - 8.1 g/dL 7.0  6.9  6.7   Total Bilirubin 0.3 - 1.2 mg/dL 0.7  0.7  0.6   Alkaline Phos 38 - 126 U/L 80     AST 15 - 41 U/L 19  16  56   ALT 0 - 44 U/L 15  12  40     DIAGNOSTIC IMAGING:  I have independently reviewed the scans and discussed with the patient. No results found.   ASSESSMENT:  1.  Clinical stage 0 CLL: -She was evaluated at the request of Dr. Estanislado Pandy for elevated white count, predominantly lymphocytes since January 2019. -Denies any B symptoms.  Physical exam does not show any lymphadenopathy or splenomegaly. -We reviewed results of flow cytometry from 12/03/2019.  Monoclonal B-cell population with coexpression of CD5 comprises 36% of lymphocytes.  Overall the morphology and immunophenotype favor CLL.  However there is a small chance of it being atypical mantle cell lymphoma.   2.  Rheumatoid arthritis: -Small joints of the hands involved.  Plaquenil did not help. -She is taking Tylenol and NSAIDs.   3.  Osteopenia: -DEXA scan on 02/07/2018 shows T score -2.3. -Vitamin D level was borderline low at 24.    4.  Vitamin B12 deficiency: -B12 level was low at 177.  She was given B12 injection today.  She was told to start taking B12 1 mg tablet daily.   PLAN:  1.  Clinical stage 0 CLL: - She does not report any fevers, night sweats or weight loss in the last 6 months.  No infections reported. - Physical exam today did not reveal any palpable adenopathy or splenomegaly. - Reviewed labs which showed white count is 13.5 with ALC of 6.7.  Hemoglobin and platelets are normal.  LDH normal.  Liver function panel is normal. - No indication for treatment at this time.  RTC 6 months with repeat labs and exam.   2.  Rheumatoid arthritis: - She was treated briefly with Imuran with the improvement in symptoms but she could not tolerate it.   3.  Osteopenia: - Continue vitamin D supplements.  Vitamin D level is 43.   4.  Vitamin B12  deficiency: - Continue B12 tablet daily.   Orders placed this encounter:  Orders Placed This Encounter  Procedures   CBC with Differential/Platelet   Comprehensive metabolic panel   Lactate dehydrogenase      Derek Jack, MD [facility] [phone]

## 2022-06-23 ENCOUNTER — Ambulatory Visit: Payer: Medicare HMO | Admitting: Physician Assistant

## 2022-07-10 DIAGNOSIS — E039 Hypothyroidism, unspecified: Secondary | ICD-10-CM | POA: Diagnosis not present

## 2022-07-10 DIAGNOSIS — Z13220 Encounter for screening for lipoid disorders: Secondary | ICD-10-CM | POA: Diagnosis not present

## 2022-07-10 DIAGNOSIS — I10 Essential (primary) hypertension: Secondary | ICD-10-CM | POA: Diagnosis not present

## 2022-07-27 DIAGNOSIS — R7309 Other abnormal glucose: Secondary | ICD-10-CM | POA: Diagnosis not present

## 2022-08-23 NOTE — Progress Notes (Signed)
Office Visit Note  Patient: Jacqueline Casey             Date of Birth: 1946-02-22           MRN: [id number]             PCP: Jake Samples, PA-C Referring: Jake Samples, [place]* Visit Date: 09/01/2022 Occupation: '@GUAROCC'$ @  Subjective:  Recurrent flares   History of Present Illness: Jacqueline Casey is a 77 y.o. female with history of autoimmune disease and osteoarthritis.  She is not currently taking any immunosuppressive agents.  Patient continues to experience recurrent flares involving multiple joints.  She had to have a recent right ankle injection performed on 08/30/22 by her podiatrist, Dr. Paulla Dolly, due to increased pain and swelling.  She previously had right MTP joint aspirations performed due to recurrent pain and swelling.  She continues to experience intermittent pain and stiffness in both knee joints. She has ongoing chronic sicca symptoms but denies any sores in her mouth or nose.  She has not had any symptoms of Raynaud's phenomenon.  She denies any recent rashes but continues to have easy bruising. Patient reports that she had previously noticed improvement while taking Imuran but had to discontinue due to side effects.  She is willing to retry Plaquenil.   Activities of Daily Living:  Patient reports morning stiffness for several hours.   Patient Reports nocturnal pain.  Difficulty dressing/grooming: Denies Difficulty climbing stairs: Reports Difficulty getting out of chair: Reports Difficulty using hands for taps, buttons, cutlery, and/or writing: Denies  Review of Systems  Constitutional:  Positive for fatigue.  HENT:  Positive for mouth dryness. Negative for mouth sores and nose dryness.   Eyes:  Positive for dryness. Negative for pain and visual disturbance.  Respiratory:  Negative for cough, hemoptysis, shortness of breath and difficulty breathing.   Cardiovascular:  Negative for chest pain, palpitations, hypertension and swelling in legs/feet.   Gastrointestinal:  Negative for blood in stool, constipation and diarrhea.  Endocrine: Negative for increased urination.  Genitourinary:  Negative for painful urination and involuntary urination.  Musculoskeletal:  Positive for joint pain, gait problem, joint pain, joint swelling, myalgias, muscle weakness, morning stiffness, muscle tenderness and myalgias.  Skin:  Negative for color change, pallor, rash, hair loss, nodules/bumps, skin tightness, ulcers and sensitivity to sunlight.  Allergic/Immunologic: Negative for susceptible to infections.  Neurological:  Positive for headaches. Negative for dizziness, numbness and weakness.  Hematological:  Negative for swollen glands.  Psychiatric/Behavioral:  Negative for depressed mood and sleep disturbance. The patient is not nervous/anxious.     PMFS History:  Patient Active Problem List   Diagnosis Date Noted   CLL (chronic lymphocytic leukemia) ([place]) 12/29/2019   Leukocytosis 12/03/2019   Baker's cyst of knee 10/18/2011   Knee mass 10/04/2011    Past Medical History:  Diagnosis Date   Baker's cyst of knee 10/18/2011   CLL (chronic lymphocytic leukemia) ([place])    per patient    Hypertension    Osteoarthritis    Thyroid disease     Family History  Problem Relation Age of Onset   Cancer Other    Diabetes Other    Cancer Mother        breast   COPD Mother    Leukemia Father    Osteoporosis Sister    Asthma Sister    Dermatomyositis Sister    COPD Sister    Kidney disease Sister    Cancer Son  lymphoma, colon   Diabetes Son    COPD Daughter    Osteoporosis Daughter    Rheum arthritis Daughter    Past Surgical History:  Procedure Laterality Date   FOOT SURGERY Right 2017   hammer toe    HAND SURGERY Left 2014   finger, hand   SKIN TAG REMOVAL  11/07/2018   stomach and below left eye    THYROID SURGERY     TUBAL LIGATION     VAGINAL HYSTERECTOMY     Social History   Social History Narrative   Not on file     There is no immunization history on file for this patient.   Objective: Vital Signs: BP (!) 160/89 (BP Location: Left Arm, Patient Position: Sitting, Cuff Size: Normal)   Pulse 69   Resp 17   Ht '5\' 9"'$  (1.753 m)   Wt 235 lb 6.4 oz (106.8 kg)   BMI 34.76 kg/m    Physical Exam Vitals and nursing note reviewed.  Constitutional:      Appearance: She is well-developed.  HENT:     Head: Normocephalic and atraumatic.  Eyes:     Conjunctiva/sclera: Conjunctivae normal.  Cardiovascular:     Rate and Rhythm: Normal rate and regular rhythm.     Heart sounds: Normal heart sounds.  Pulmonary:     Effort: Pulmonary effort is normal.     Breath sounds: Normal breath sounds.  Abdominal:     General: Bowel sounds are normal.     Palpations: Abdomen is soft.  Musculoskeletal:     Cervical back: Normal range of motion.  Skin:    General: Skin is warm and dry.     Capillary Refill: Capillary refill takes less than 2 seconds.  Neurological:     Mental Status: She is alert and oriented to person, place, and time.  Psychiatric:        Behavior: Behavior normal.      Musculoskeletal Exam: C-spine has good range of motion.  Postural thoracic kyphosis noted.  Shoulder joints and elbow joints have good range of motion.  Limited extension of the right wrist with synovial thickening.  Left wrist has good range of motion with no tenderness or synovitis.  PIP and DIP thickening consistent with osteoarthritis of both hands.  Mild lateral deviation noted bilaterally.  [place] joint prominence bilaterally.  Hip joints have good range of motion with no groin pain.  Painful range of motion of both knee joints.  Baker's cyst palpable behind both knees.  Tenderness inflammation of the posterior lateral aspect of the right heel around the peroneal tendon.  CDAI Exam: CDAI Score: -- Patient Global: --; Provider Global: -- Swollen: 0 ; Tender: 4  Joint Exam 09/01/2022      Right  Left  Ankle   Tender      MTP 2   Tender     MTP 3   Tender     MTP 4   Tender        Investigation: No additional findings.  Imaging: No results found.  Recent Labs: Lab Results  Component Value Date   WBC 13.5 (H) 06/13/2022   HGB 12.5 06/13/2022   PLT 262 06/13/2022   NA 140 06/13/2022   K 4.0 06/13/2022   CL 104 06/13/2022   CO2 28 06/13/2022   GLUCOSE 110 (H) 06/13/2022   BUN 18 06/13/2022   CREATININE 1.01 (H) 06/13/2022   BILITOT 0.7 06/13/2022   ALKPHOS 80 06/13/2022   AST  19 06/13/2022   ALT 15 06/13/2022   PROT 7.0 06/13/2022   ALBUMIN 4.1 06/13/2022   CALCIUM 9.2 06/13/2022   GFRAA 45 (L) 03/29/2020   QFTBGOLDPLUS NEGATIVE 04/07/2022    Speciality Comments: PLQ eye exam: 03/06/2018 normal. Dr. Marvel Plan. Follow up in 6 months.  Procedures:  No procedures performed Allergies: Sulfa antibiotics  Autoimmune lab work updated on 04/07/22:     Assessment / Plan:     Visit Diagnoses: Autoimmune disease ([place]) - ANA 1:160 NH, dsDNA+, inflammatory arthritis. X-rays obtained in 2019 showed MCP narrowing consistent with inflammatory arthritis: She presents today to discuss treatment options to manage the recurrent flares of inflammatory arthritis involving multiple joints she has been experiencing.  She had a right ankle cortisone injection performed by Dr. Paulla Dolly on 08/30/2022.  On 03/22/22 she had a right 3rd MPJ aspiration and cortisone injection performed by Dr. Paulla Dolly.  She continues to have chronic pain and stiffness in both hands and both knee joints.  She is not currently taking any immunosuppressive agents.  She previously took Imuran but discontinued due to GI side effects and mild LFT elevations.  She had noticed improvement while taking Imuran and was disappointed to have to discontinue therapy. LFTs have normalized. She previously tried Plaquenil but discontinued due to hair thinning.  Different treatment options were discussed today in detail.  Discussed retrying Plaquenil 200 mg 1  tablet by mouth daily.  Indications, contraindications, and potential side effects of Plaquenil were discussed today in detail.  All questions were addressed and consent was obtained.  If she cannot tolerate Plaquenil or has inadequate response we can discuss low-dose Arava in the future if needed. She was in agreement.   Lab work from 04/07/2022 was reviewed today in the office: Double-stranded DNA negative, complements within normal limits, ESR within normal limits, and no proteinuria  The following lab work will be obtained today. Plaquenil will be sent to the pharmacy pending lab results.  Her next lab work will be due in 1 month, 3 months, and every 5 months.  She will follow up in the office in 6-8 weeks to assess her response.  - Plan: Protein / creatinine ratio, urine, CBC with Differential/Platelet, COMPLETE METABOLIC PANEL WITH GFR, Anti-DNA antibody, double-stranded, C3 and C4, Sedimentation rate, ANA, Rheumatoid factor, Cyclic citrul peptide antibody, IgG, C-reactive protein  Patient was counseled on the purpose, proper use, and adverse effects of hydroxychloroquine including nausea/diarrhea, skin rash, headaches, and sun sensitivity.  Advised patient to wear sunscreen once starting hydroxychloroquine to reduce risk of rash associated with sun sensitivity.  Discussed importance of annual eye exams while on hydroxychloroquine to monitor to ocular toxicity and discussed importance of frequent laboratory monitoring.  Provided patient with eye exam form for baseline ophthalmologic exam.  Reviewed risk for QTC prolongation when used in combination with other QTc prolonging agents (including but not limited to antiarrhythmics, macrolide antibiotics, flouroquinolones, tricyclic antidepressants, citalopram, specific antipsychotics, ondansetron, migraine triptans, and methadone). Provided patient with educational materials on hydroxychloroquine and answered all questions.  Patient consented to  hydroxychloroquine. Will upload consent in the media tab.    Dose will be Plaquenil 200 mg once daily.  Prescription pending lab results.  High risk medication use - Plan to restart plaquenil 200 mg 1 tablet by mouth daily.   G6PD with normal limits on 10/20/2015.  CBC and CMP updated on 06/13/22.  Orders for CBC and CMP released.   Previous: Imuran 50 mg 1 tablet by mouth daily-Pt took  imuran for 17 days and discontinued due to GI SE and elevated LFTs. Orders for CBC and CMP released today.   - Plan: CBC with Differential/Platelet, COMPLETE METABOLIC PANEL WITH GFR  LFTs abnormal - LFTs WNL on 06/13/22. Plan: COMPLETE METABOLIC PANEL WITH GFR  Primary osteoarthritis of both hands: PIP and DIP thickening.  [place] joint prominence bilaterally.    Synovial cyst of right popliteal space: Unchanged. Chronic pain and stiffness in the right knee.    Synovial cyst of left knee Unchanged.  She has chronic pain and stiffness in both knee joints.    Chronic pain of right ankle: Under care of Dr. Paulla Dolly.  She had a cortisone injection on 08/30/22.  Continues to have tenderness and warmth on the lateral aspect of the right ankle.  RF and anti-CCP will be checked today along with ESR.    Other medical conditions are listed as follows:   Granuloma annulare - She is followed by dermatology.  Age-related osteoporosis without current pathological fracture - DEXA on 10/28/20: The BMD measured at Fem[facility] Neck Left is 0.697 g/cm2 with a T-score of -2.5. Patient has declined treatment.  CLL (chronic lymphocytic leukemia) ([place]) - She was diagnosed with CLL and has been followed closely at [facility].  History of hypothyroidism  History of hypertension: Blood pressure was 160/89 today in the office.  Blood pressure was rechecked prior to the patient leaving.  She was advised to monitor blood pressure closely.   Orders: Orders Placed This Encounter  Procedures   Protein / creatinine ratio, urine    CBC with Differential/Platelet   COMPLETE METABOLIC PANEL WITH GFR   Anti-DNA antibody, double-stranded   C3 and C4   Sedimentation rate   ANA   Rheumatoid factor   Cyclic citrul peptide antibody, IgG   C-reactive protein   No orders of the defined types were placed in this encounter.    Follow-Up Instructions: Return in about 6 weeks (around 10/13/2022) for Autoimmune Disease, Osteoarthritis.   Ofilia Neas, PA-C  Note - This record has been created using Dragon software.  Chart creation errors have been sought, but may not always  have been located. Such creation errors do not reflect on  the standard of medical care.

## 2022-08-25 DIAGNOSIS — C9190 Lymphoid leukemia, unspecified not having achieved remission: Secondary | ICD-10-CM | POA: Diagnosis not present

## 2022-08-25 DIAGNOSIS — Z6835 Body mass index (BMI) 35.0-35.9, adult: Secondary | ICD-10-CM | POA: Diagnosis not present

## 2022-08-25 DIAGNOSIS — E6609 Other obesity due to excess calories: Secondary | ICD-10-CM | POA: Diagnosis not present

## 2022-08-25 DIAGNOSIS — Z13220 Encounter for screening for lipoid disorders: Secondary | ICD-10-CM | POA: Diagnosis not present

## 2022-08-25 DIAGNOSIS — E039 Hypothyroidism, unspecified: Secondary | ICD-10-CM | POA: Diagnosis not present

## 2022-08-25 DIAGNOSIS — Z0001 Encounter for general adult medical examination with abnormal findings: Secondary | ICD-10-CM | POA: Diagnosis not present

## 2022-08-25 DIAGNOSIS — I10 Essential (primary) hypertension: Secondary | ICD-10-CM | POA: Diagnosis not present

## 2022-08-25 DIAGNOSIS — Z1331 Encounter for screening for depression: Secondary | ICD-10-CM | POA: Diagnosis not present

## 2022-08-25 DIAGNOSIS — M069 Rheumatoid arthritis, unspecified: Secondary | ICD-10-CM | POA: Diagnosis not present

## 2022-08-25 DIAGNOSIS — J302 Other seasonal allergic rhinitis: Secondary | ICD-10-CM | POA: Diagnosis not present

## 2022-08-30 ENCOUNTER — Ambulatory Visit: Payer: Medicare HMO | Admitting: Podiatry

## 2022-08-30 ENCOUNTER — Encounter: Payer: Self-pay | Admitting: Podiatry

## 2022-08-30 DIAGNOSIS — M7661 Achilles tendinitis, right leg: Secondary | ICD-10-CM | POA: Diagnosis not present

## 2022-08-30 MED ORDER — TRIAMCINOLONE ACETONIDE 10 MG/ML IJ SUSP
10.0000 mg | Freq: Once | INTRAMUSCULAR | Status: AC
  Administered 2022-08-30: 10 mg

## 2022-08-30 NOTE — Progress Notes (Signed)
Subjective:   Patient ID: Jacqueline Casey, female   DOB: 77 y.o.   MRN: [id number]   HPI Patient presents stating that she has a lot of pain in the outside of the right foot and it started in the last couple weeks   ROS      Objective:  Physical Exam  Neuro vas scaler status intact with inflammation pain of the posterior lateral aspect of the right heel with fluid buildup noted around the peroneal and slightly into the Achilles tendon lateral     Assessment:  Appears to be an acute inflammatory process with inflammation     Plan:  Reviewed condition I went ahead today I did do sterile prep and I injected the lateral complex 3 mg dexamethasone Kenalog 5 mg Xylocaine after explaining risk of injection and patient will be seen back if symptoms were to recur.

## 2022-09-01 ENCOUNTER — Encounter: Payer: Self-pay | Admitting: Physician Assistant

## 2022-09-01 ENCOUNTER — Other Ambulatory Visit: Payer: Self-pay

## 2022-09-01 ENCOUNTER — Ambulatory Visit: Payer: Medicare HMO | Attending: Physician Assistant | Admitting: Physician Assistant

## 2022-09-01 VITALS — BP 160/89 | HR 69 | Resp 17 | Ht 69.0 in | Wt 235.4 lb

## 2022-09-01 DIAGNOSIS — R7989 Other specified abnormal findings of blood chemistry: Secondary | ICD-10-CM | POA: Diagnosis not present

## 2022-09-01 DIAGNOSIS — Z8639 Personal history of other endocrine, nutritional and metabolic disease: Secondary | ICD-10-CM | POA: Diagnosis not present

## 2022-09-01 DIAGNOSIS — C9110 Chronic lymphocytic leukemia of B-cell type not having achieved remission: Secondary | ICD-10-CM

## 2022-09-01 DIAGNOSIS — M25571 Pain in right ankle and joints of right foot: Secondary | ICD-10-CM | POA: Diagnosis not present

## 2022-09-01 DIAGNOSIS — M19041 Primary osteoarthritis, right hand: Secondary | ICD-10-CM

## 2022-09-01 DIAGNOSIS — M359 Systemic involvement of connective tissue, unspecified: Secondary | ICD-10-CM | POA: Diagnosis not present

## 2022-09-01 DIAGNOSIS — M7122 Synovial cyst of popliteal space [Baker], left knee: Secondary | ICD-10-CM | POA: Diagnosis not present

## 2022-09-01 DIAGNOSIS — M7121 Synovial cyst of popliteal space [Baker], right knee: Secondary | ICD-10-CM | POA: Diagnosis not present

## 2022-09-01 DIAGNOSIS — Z8679 Personal history of other diseases of the circulatory system: Secondary | ICD-10-CM | POA: Diagnosis not present

## 2022-09-01 DIAGNOSIS — L920 Granuloma annulare: Secondary | ICD-10-CM | POA: Diagnosis not present

## 2022-09-01 DIAGNOSIS — M19042 Primary osteoarthritis, left hand: Secondary | ICD-10-CM

## 2022-09-01 DIAGNOSIS — M810 Age-related osteoporosis without current pathological fracture: Secondary | ICD-10-CM

## 2022-09-01 DIAGNOSIS — Z79899 Other long term (current) drug therapy: Secondary | ICD-10-CM

## 2022-09-01 DIAGNOSIS — G8929 Other chronic pain: Secondary | ICD-10-CM

## 2022-09-01 NOTE — Telephone Encounter (Signed)
Pending lab results, patient will be starting plaquenil per Hazel Sams, PA-C. Thanks!   Consent obtained and sent to the scan center.

## 2022-09-01 NOTE — Patient Instructions (Signed)
Standing Labs We placed an order today for your standing lab work.   Please have your standing labs drawn in 1 month after starting plaquenil, then 3 months and then every 5 months.   Please have your labs drawn 2 weeks prior to your appointment so that the provider can discuss your lab results at your appointment.  Please note that you may see your imaging and lab results in [facility] before we have reviewed them. We will contact you once all results are reviewed. Please allow our office up to 72 hours to thoroughly review all of the results before contacting the office for clarification of your results.  Lab hours are:   Monday through Thursday from 8:00 am -12:30 pm and 1:00 pm-5:00 pm and Friday from 8:00 am-12:00 pm.  Please be advised, all patients with office appointments requiring lab work will take precedent over walk-in lab work.   Labs are drawn by [facility]. Please bring your co-pay at the time of your lab draw.  You may receive a bill from [facility] for your lab work.  Please note if you are on Hydroxychloroquine and and an order has been placed for a Hydroxychloroquine level, you will need to have it drawn 4 hours or more after your last dose.  If you wish to have your labs drawn at another location, please call the office 24 hours in advance so we can fax the orders.  The office is located at [address], [place], [place], [place] [address] No appointment is necessary.    If you have any questions regarding directions or hours of operation,  please call [phone].   As a reminder, please drink plenty of water prior to coming for your lab work. Thanks!    Hydroxychloroquine Tablets What is this medication? HYDROXYCHLOROQUINE (hye drox ee KLOR oh kwin) treats autoimmune conditions, such as rheumatoid arthritis and lupus. It works by slowing down an overactive immune system. It may also be used to prevent and treat malaria. It works by killing the parasite that causes  malaria. It belongs to a group of medications called DMARDs. This medicine may be used for other purposes; ask your health care provider or pharmacist if you have questions. COMMON BRAND NAME(S): Plaquenil, Quineprox What should I tell my care team before I take this medication? They need to know if you have any of these conditions: Diabetes Eye disease, vision problems Frequently drink alcohol G6PD deficiency Heart disease Irregular heartbeat or rhythm Kidney disease Liver disease Porphyria Psoriasis An unusual or allergic reaction to hydroxychloroquine, other medications, foods, dyes, or preservatives Pregnant or trying to get pregnant Breastfeeding How should I use this medication? Take this medication by mouth with water. Take it as directed on the prescription label. Do not cut, crush, or chew this medication. Swallow the tablets whole. Take it with food. Do not take it more than directed. Take all of this medication unless your care team tells you to stop it early. Keep taking it even if you think you are better. Take products with antacids in them at a different time of day than this medication. Take this medication 4 hours before or 4 hours after antacids. Talk to your care team if you have questions. Talk to your care team about the use of this medication in children. While this medication may be prescribed for selected conditions, precautions do apply. Overdosage: If you think you have taken too much of this medicine contact a poison control center or emergency room at  once. NOTE: This medicine is only for you. Do not share this medicine with others. What if I miss a dose? If you miss a dose, take it as soon as you can. If it is almost time for your next dose, take only that dose. Do not take double or extra doses. What may interact with this medication? Do not take this medication with any of the following: Cisapride Dronedarone Pimozide Thioridazine This medication may  also interact with the following: Ampicillin Antacids Cimetidine Cyclosporine Digoxin Kaolin Medications for diabetes, such as insulin, glipizide, glyburide Medications for seizures, such as carbamazepine, phenobarbital, phenytoin Mefloquine Methotrexate Other medications that cause heart rhythm changes Praziquantel This list may not describe all possible interactions. Give your health care provider a list of all the medicines, herbs, non-prescription drugs, or dietary supplements you use. Also tell them if you smoke, drink alcohol, or use illegal drugs. Some items may interact with your medicine. What should I watch for while using this medication? Visit your care team for regular checks on your progress. Tell your care team if your symptoms do not start to get better or if they get worse. You may need blood work done while you are taking this medication. If you take other medications that can affect heart rhythm, you may need more testing. Talk to your care team if you have questions. Your vision may be tested before and during use of this medication. Tell your care team right away if you have any change in your eyesight. This medication may cause serious skin reactions. They can happen weeks to months after starting the medication. Contact your care team right away if you notice fevers or flu-like symptoms with a rash. The rash may be red or purple and then turn into blisters or peeling of the skin. Or, you might notice a red rash with swelling of the face, lips or lymph nodes in your neck or under your arms. If you or your family notice any changes in your behavior, such as new or worsening depression, thoughts of harming yourself, anxiety, or other unusual or disturbing thoughts, or memory loss, call your care team right away. What side effects may I notice from receiving this medication? Side effects that you should report to your care team as soon as possible: Allergic reactions--skin  rash, itching, hives, swelling of the face, lips, tongue, or throat Aplastic anemia--unusual weakness or fatigue, dizziness, headache, trouble breathing, increased bleeding or bruising Change in vision Heart rhythm changes--fast or irregular heartbeat, dizziness, feeling faint or lightheaded, chest pain, trouble breathing Infection--fever, chills, cough, or sore throat Low blood sugar (hypoglycemia)--tremors or shaking, anxiety, sweating, cold or clammy skin, confusion, dizziness, rapid heartbeat Muscle injury--unusual weakness or fatigue, muscle pain, dark yellow or brown urine, decrease in amount of urine Pain, tingling, or numbness in the hands or feet Rash, fever, and swollen lymph nodes Redness, blistering, peeling, or loosening of the skin, including inside the mouth Thoughts of suicide or self-harm, worsening mood, or feelings of depression Unusual bruising or bleeding Side effects that usually do not require medical attention (report to your care team if they continue or are bothersome): Diarrhea Headache Nausea Stomach pain Vomiting This list may not describe all possible side effects. Call your doctor for medical advice about side effects. You may report side effects to FDA at 1-800-FDA-1088. Where should I keep my medication? Keep out of the reach of children and pets. Store at room temperature up to 30 degrees C (86 degrees F). Protect  from light. Get rid of any unused medication after the expiration date. To get rid of medications that are no longer needed or have expired: Take the medication to a medication take-back program. Check with your pharmacy or law enforcement to find a location. If you cannot return the medication, check the label or package insert to see if the medication should be thrown out in the garbage or flushed down the toilet. If you are not sure, ask your care team. If it is safe to put it in the trash, empty the medication out of the container. Mix the  medication with cat litter, dirt, coffee grounds, or other unwanted substance. Seal the mixture in a bag or container. Put it in the trash. NOTE: This sheet is a summary. It may not cover all possible information. If you have questions about this medicine, talk to your doctor, pharmacist, or health care provider.  2023 Elsevier/Gold Standard (2004-10-18 00:00:00)

## 2022-09-03 LAB — COMPLETE METABOLIC PANEL WITH GFR
AG Ratio: 1.9 (calc) (ref 1.0–2.5)
ALT: 11 U/L (ref 6–29)
AST: 15 U/L (ref 10–35)
Albumin: 4.3 g/dL (ref 3.6–5.1)
Alkaline phosphatase (APISO): 86 U/L (ref 37–153)
BUN: 12 mg/dL (ref 7–25)
CO2: 29 mmol/L (ref 20–32)
Calcium: 9.8 mg/dL (ref 8.6–10.4)
Chloride: 102 mmol/L (ref 98–110)
Creat: 0.89 mg/dL (ref 0.60–1.00)
Globulin: 2.3 g/dL (calc) (ref 1.9–3.7)
Glucose, Bld: 96 mg/dL (ref 65–99)
Potassium: 4.2 mmol/L (ref 3.5–5.3)
Sodium: 141 mmol/L (ref 135–146)
Total Bilirubin: 0.5 mg/dL (ref 0.2–1.2)
Total Protein: 6.6 g/dL (ref 6.1–8.1)
eGFR: 68 mL/min/{1.73_m2} (ref >=60)

## 2022-09-03 LAB — CBC WITH DIFFERENTIAL/PLATELET
Absolute Monocytes: 599 cells/uL (ref 200–950)
Basophils Absolute: 67 cells/uL (ref 0–200)
Basophils Relative: 0.5 %
Eosinophils Absolute: 186 cells/uL (ref 15–500)
Eosinophils Relative: 1.4 %
HCT: 38.9 % (ref 35.0–45.0)
Hemoglobin: 13.1 g/dL (ref 11.7–15.5)
Lymphs Abs: 7328 cells/uL — ABNORMAL HIGH (ref 850–3900)
MCH: 29.6 pg (ref 27.0–33.0)
MCHC: 33.7 g/dL (ref 32.0–36.0)
MCV: 87.8 fL (ref 80.0–100.0)
MPV: 10.3 fL (ref 7.5–12.5)
Monocytes Relative: 4.5 %
Neutro Abs: 5121 cells/uL (ref 1500–7800)
Neutrophils Relative %: 38.5 %
Platelets: 242 10*3/uL (ref 140–400)
RBC: 4.43 10*6/uL (ref 3.80–5.10)
RDW: 12.4 % (ref 11.0–15.0)
Total Lymphocyte: 55.1 %
WBC: 13.3 10*3/uL — ABNORMAL HIGH (ref 3.8–10.8)

## 2022-09-03 LAB — PROTEIN / CREATININE RATIO, URINE
Creatinine, Urine: 70 mg/dL (ref 20–275)
Protein/Creat Ratio: 114 mg/g creat (ref 24–184)
Protein/Creatinine Ratio: 0.114 mg/mg creat (ref 0.024–0.184)
Total Protein, Urine: 8 mg/dL (ref 5–24)

## 2022-09-03 LAB — C3 AND C4
C3 Complement: 149 mg/dL (ref 83–193)
C4 Complement: 32 mg/dL (ref 15–57)

## 2022-09-03 LAB — ANTI-NUCLEAR AB-TITER (ANA TITER): ANA Titer 1: 1:80 {titer} — ABNORMAL HIGH

## 2022-09-03 LAB — C-REACTIVE PROTEIN: CRP: 4 mg/L (ref <8.0)

## 2022-09-03 LAB — ANTI-DNA ANTIBODY, DOUBLE-STRANDED: ds DNA Ab: 11 IU/mL — ABNORMAL HIGH

## 2022-09-03 LAB — SEDIMENTATION RATE: Sed Rate: 9 mm/h (ref 0–30)

## 2022-09-03 LAB — RHEUMATOID FACTOR: Rheumatoid fact SerPl-aCnc: <14 IU/mL (ref <14)

## 2022-09-03 LAB — ANA: Anti Nuclear Antibody (ANA): POSITIVE — AB

## 2022-09-03 LAB — CYCLIC CITRUL PEPTIDE ANTIBODY, IGG: Cyclic Citrullin Peptide Ab: <16 UNITS

## 2022-09-04 ENCOUNTER — Emergency Department (HOSPITAL_COMMUNITY): Payer: Medicare HMO

## 2022-09-04 ENCOUNTER — Emergency Department (HOSPITAL_COMMUNITY)
Admission: EM | Admit: 2022-09-04 | Discharge: 2022-09-04 | Disposition: A | Discharge status: Home / Self Care | Payer: Medicare HMO | Attending: Emergency Medicine | Admitting: Emergency Medicine

## 2022-09-04 ENCOUNTER — Other Ambulatory Visit: Payer: Self-pay

## 2022-09-04 DIAGNOSIS — Z79899 Other long term (current) drug therapy: Secondary | ICD-10-CM | POA: Diagnosis not present

## 2022-09-04 DIAGNOSIS — W01198A Fall on same level from slipping, tripping and stumbling with subsequent striking against other object, initial encounter: Secondary | ICD-10-CM | POA: Insufficient documentation

## 2022-09-04 DIAGNOSIS — S4992XA Unspecified injury of left shoulder and upper arm, initial encounter: Secondary | ICD-10-CM | POA: Diagnosis not present

## 2022-09-04 DIAGNOSIS — S42291A Other displaced fracture of upper end of right humerus, initial encounter for closed fracture: Secondary | ICD-10-CM | POA: Diagnosis not present

## 2022-09-04 DIAGNOSIS — M7989 Other specified soft tissue disorders: Secondary | ICD-10-CM | POA: Diagnosis not present

## 2022-09-04 DIAGNOSIS — I10 Essential (primary) hypertension: Secondary | ICD-10-CM | POA: Insufficient documentation

## 2022-09-04 DIAGNOSIS — W19XXXA Unspecified fall, initial encounter: Secondary | ICD-10-CM

## 2022-09-04 DIAGNOSIS — S52591A Other fractures of lower end of right radius, initial encounter for closed fracture: Secondary | ICD-10-CM | POA: Diagnosis not present

## 2022-09-04 DIAGNOSIS — S62321A Displaced fracture of shaft of second metacarpal bone, left hand, initial encounter for closed fracture: Secondary | ICD-10-CM

## 2022-09-04 DIAGNOSIS — S0083XA Contusion of other part of head, initial encounter: Secondary | ICD-10-CM | POA: Insufficient documentation

## 2022-09-04 DIAGNOSIS — M79601 Pain in right arm: Secondary | ICD-10-CM | POA: Diagnosis not present

## 2022-09-04 DIAGNOSIS — S52611A Displaced fracture of right ulna styloid process, initial encounter for closed fracture: Secondary | ICD-10-CM | POA: Diagnosis not present

## 2022-09-04 DIAGNOSIS — S52501A Unspecified fracture of the lower end of right radius, initial encounter for closed fracture: Secondary | ICD-10-CM | POA: Diagnosis not present

## 2022-09-04 DIAGNOSIS — S62101A Fracture of unspecified carpal bone, right wrist, initial encounter for closed fracture: Secondary | ICD-10-CM

## 2022-09-04 DIAGNOSIS — M25571 Pain in right ankle and joints of right foot: Secondary | ICD-10-CM | POA: Insufficient documentation

## 2022-09-04 DIAGNOSIS — R519 Headache, unspecified: Secondary | ICD-10-CM | POA: Diagnosis not present

## 2022-09-04 DIAGNOSIS — S42211A Unspecified displaced fracture of surgical neck of right humerus, initial encounter for closed fracture: Secondary | ICD-10-CM

## 2022-09-04 DIAGNOSIS — S0990XA Unspecified injury of head, initial encounter: Secondary | ICD-10-CM | POA: Diagnosis not present

## 2022-09-04 DIAGNOSIS — R220 Localized swelling, mass and lump, head: Secondary | ICD-10-CM | POA: Diagnosis not present

## 2022-09-04 DIAGNOSIS — M79642 Pain in left hand: Secondary | ICD-10-CM | POA: Diagnosis not present

## 2022-09-04 DIAGNOSIS — S0993XA Unspecified injury of face, initial encounter: Secondary | ICD-10-CM | POA: Diagnosis not present

## 2022-09-04 LAB — CBC WITH DIFFERENTIAL/PLATELET
Abs Immature Granulocytes: 0 10*3/uL (ref 0.00–0.07)
Basophils Absolute: 0 10*3/uL (ref 0.0–0.1)
Basophils Relative: 0 %
Eosinophils Absolute: 0 10*3/uL (ref 0.0–0.5)
Eosinophils Relative: 0 %
HCT: 38.5 % (ref 36.0–46.0)
Hemoglobin: 12.5 g/dL (ref 12.0–15.0)
Lymphocytes Relative: 37 %
Lymphs Abs: 7.6 10*3/uL — ABNORMAL HIGH (ref 0.7–4.0)
MCH: 29.1 pg (ref 26.0–34.0)
MCHC: 32.5 g/dL (ref 30.0–36.0)
MCV: 89.7 fL (ref 80.0–100.0)
Monocytes Absolute: 1 10*3/uL (ref 0.1–1.0)
Monocytes Relative: 5 %
Neutro Abs: 11.9 10*3/uL — ABNORMAL HIGH (ref 1.7–7.7)
Neutrophils Relative %: 58 %
Platelets: 222 10*3/uL (ref 150–400)
RBC: 4.29 MIL/uL (ref 3.87–5.11)
RDW: 13.1 % (ref 11.5–15.5)
WBC: 20.6 10*3/uL — ABNORMAL HIGH (ref 4.0–10.5)
nRBC: 0 % (ref 0.0–0.2)

## 2022-09-04 LAB — BASIC METABOLIC PANEL
Anion gap: 11 (ref 5–15)
BUN: 20 mg/dL (ref 8–23)
CO2: 25 mmol/L (ref 22–32)
Calcium: 9.5 mg/dL (ref 8.9–10.3)
Chloride: 98 mmol/L (ref 98–111)
Creatinine, Ser: 0.95 mg/dL (ref 0.44–1.00)
GFR, Estimated: >60 mL/min (ref >60)
Glucose, Bld: 116 mg/dL — ABNORMAL HIGH (ref 70–99)
Potassium: 3.5 mmol/L (ref 3.5–5.1)
Sodium: 134 mmol/L — ABNORMAL LOW (ref 135–145)

## 2022-09-04 LAB — PROTIME-INR
INR: 0.9 (ref 0.8–1.2)
Prothrombin Time: 12.4 seconds (ref 11.4–15.2)

## 2022-09-04 MED ORDER — ACETAMINOPHEN 325 MG PO TABS
650.0000 mg | ORAL_TABLET | Freq: Once | ORAL | Status: AC
  Administered 2022-09-04: 650 mg via ORAL
  Filled 2022-09-04: qty 2

## 2022-09-04 MED ORDER — ACETAMINOPHEN 325 MG PO TABS
325.0000 mg | ORAL_TABLET | Freq: Once | ORAL | Status: AC
  Administered 2022-09-04: 325 mg via ORAL
  Filled 2022-09-04: qty 1

## 2022-09-04 MED ORDER — HYDROCODONE-ACETAMINOPHEN 5-325 MG PO TABS: ORAL_TABLET | ORAL | 0 refills | Status: DC

## 2022-09-04 MED ORDER — HYDROXYCHLOROQUINE SULFATE 200 MG PO TABS: 200.0000 mg | ORAL_TABLET | Freq: Every day | ORAL | 0 refills | Status: DC

## 2022-09-04 NOTE — Progress Notes (Signed)
ANA remains positive.  dsDNA remains positive but is stable.  Complements WNL. ESR and CRP WNL.  RF negative.  CMP WNL. CBC stable-patient has history of CLL.  Protein creatinine ratio WNL.

## 2022-09-04 NOTE — Care Plan (Signed)
Patient discussed with EDP.  Imaging reviewed.  R proximal humerus fracture, R distal radius fracture, L second metacarpal neck fracture.  Recommendations: NWB RUE NWB LUE Sugar tong splint RUE Sling RUE Volar short arm splint LUE in intrinsic plus position Follow up in clinic in 1 week  Georgeanna Harrison M.D. Orthopaedic Surgery [facility]

## 2022-09-04 NOTE — ED Triage Notes (Signed)
Pt tripped going into [facility], striking head, right shoulder, right arm, left hand, left wrist, left knee.

## 2022-09-04 NOTE — Discharge Instructions (Signed)
You may elevate your left hand on pillows as needed.  Avoid any use of your right arm.  Keep the splint and sling in place until you see the orthopedic provider listed.  The pain medicine you have been prescribed also has Tylenol in it.  You may take this every 6 hours.  You may take 1 extra strength Tylenol (500 mg) no more than 3 times a day if taking the pain medication.  Please call the orthopedic provider listed to arrange a follow-up appointment, he would like to see you in 1 week.

## 2022-09-04 NOTE — ED Provider Notes (Signed)
[facility] EMERGENCY DEPARTMENT Provider Note   CSN: [id number] Arrival date & time: 09/04/22  1032     History  Chief Complaint  Patient presents with   Gus Rankin is a 77 y.o. female.   Fall Pertinent negatives include no chest pain, no abdominal pain, no headaches and no shortness of breath.        ARANDA BIHM is a 77 y.o. female with past medical history of CLL, hypertension, OA, thyroid disease who presents to the Emergency Department complaining of pain of her left hand, right shoulder, right wrist, right face and right ankle.  She endorses mechanical fall around 1030 this morning.  She was getting out of the car when she tripped falling on her right side.  She denies any loss of consciousness, does not take any blood thinners currently but does take 81 mg aspirin daily.  She reports immediate pain to her right shoulder and right wrist.  She is currently unable to move her right shoulder due to pain.  She denies any headache, dizziness, visual changes, neck pain, chest pain abdominal pain or shortness of breath.   Home Medications Prior to Admission medications   Medication Sig Start Date End Date Taking? Authorizing Provider  acetaminophen (TYLENOL) 650 MG CR tablet Take 650 mg by mouth as needed for pain.    [provider]  b complex vitamins capsule Take 1 capsule by mouth daily.    [provider]  CALCIUM PO Take by mouth daily.    [provider]  cetirizine (ZYRTEC) 10 MG tablet Take 10 mg by mouth daily.    [provider]  diclofenac sodium (VOLTAREN) 1 % GEL Apply 3 grams to 3 large joints up to 3 times daily. 12/13/17   Ofilia Neas, PA-C  fluticasone (CUTIVATE) 0.05 % cream Apply topically 2 (two) times daily as needed. 02/23/22   [provider]  hydrochlorothiazide (HYDRODIURIL) 12.5 MG tablet Take 12.5 mg by mouth daily. 10/27/19   [provider]  hydroxychloroquine (PLAQUENIL) 200  MG tablet Take 1 tablet (200 mg total) by mouth daily. 09/04/22   Ofilia Neas, PA-C  ibuprofen (ADVIL,MOTRIN) 200 MG tablet Take 200 mg by mouth every 6 (six) hours as needed. Patient not taking: Reported on 09/01/2022    [provider]  ketoconazole (NIZORAL) 2 % cream Apply topically 2 (two) times daily as needed. 02/23/22   [provider]  levothyroxine (SYNTHROID, LEVOTHROID) 88 MCG tablet Take 88 mcg by mouth daily.    [provider]  TART CHERRY PO Take by mouth.    [provider]  triamcinolone cream (KENALOG) 0.1 % as needed. 04/25/18   [provider]  TURMERIC CURCUMIN PO Take by mouth.    [provider]  vitamin B-12 (CYANOCOBALAMIN) 500 MCG tablet Take 500 mcg by mouth 2 (two) times daily.    [provider]  VITAMIN D PO Take by mouth daily.    [provider]      Allergies    Sulfa antibiotics    Review of Systems   Review of Systems  Constitutional:  Negative for appetite change and fever.  Eyes:  Negative for photophobia and visual disturbance.  Respiratory:  Negative for shortness of breath.   Cardiovascular:  Negative for chest pain.  Gastrointestinal:  Negative for abdominal pain, nausea and vomiting.  Musculoskeletal:  Positive for arthralgias (Pain of the right shoulder, right wrist, left hand, right  ankle) and joint swelling. Negative for back pain and neck pain.  Skin:        Bruising of the right periorbital region, left hand  Neurological:  Negative for dizziness, syncope, facial asymmetry, weakness, light-headedness and headaches.  Psychiatric/Behavioral:  Negative for confusion.     Physical Exam Updated Vital Signs BP (!) 161/57   Pulse 70   Temp 97.6 F (36.4 C) (Oral)   Resp 20   Ht '5\' 9"'$  (1.753 m)   Wt 105.7 kg   SpO2 100%   BMI 34.41 kg/m  Physical Exam Vitals and nursing note reviewed.  Constitutional:      General: She is not in acute distress.    Appearance: She  is not toxic-appearing.     Comments: Patient uncomfortable appearing  HENT:     Head:     Comments: Tenderness, edema and ecchymosis right periorbital region.  No bony deformity or crepitus    Mouth/Throat:     Mouth: Mucous membranes are moist.     Pharynx: Oropharynx is clear.  Eyes:     Extraocular Movements: Extraocular movements intact.     Conjunctiva/sclera: Conjunctivae normal.     Pupils: Pupils are equal, round, and reactive to light.  Cardiovascular:     Rate and Rhythm: Normal rate and regular rhythm.     Pulses: Normal pulses.  Pulmonary:     Effort: Pulmonary effort is normal. No respiratory distress.  Chest:     Chest wall: No tenderness.  Abdominal:     Palpations: Abdomen is soft.     Tenderness: There is no abdominal tenderness.  Musculoskeletal:        General: Swelling, tenderness, deformity and signs of injury present.     Cervical back: Normal range of motion. No tenderness.     Comments: Tender to palpation with mild step-off deformity right shoulder.  Patient unable to abduct due to level of pain.  Tenderness with attempted range of motion of the right wrist.  No bony deformity.  Localized ecchymosis and edema at the base of the left index finger.  Tenderness palpation of the posterior right ankle without bony deformity.  Mild edema noted  Skin:    General: Skin is warm.     Capillary Refill: Capillary refill takes less than 2 seconds.     Findings: No bruising or rash.  Neurological:     General: No focal deficit present.     Mental Status: She is alert and oriented to person, place, and time.     Sensory: No sensory deficit.     Motor: No weakness.     ED Results / Procedures / Treatments   Labs (all labs ordered are listed, but only abnormal results are displayed) Labs Reviewed  CBC WITH DIFFERENTIAL/PLATELET - Abnormal; Notable for the following components:      Result Value   WBC 20.6 (*)    Neutro Abs 11.9 (*)    Lymphs Abs 7.6 (*)    All  other components within normal limits  BASIC METABOLIC PANEL - Abnormal; Notable for the following components:   Sodium 134 (*)    Glucose, Bld 116 (*)    All other components within normal limits  PROTIME-INR    EKG EKG Interpretation  Date/Time:  Monday September 04 2022 11:55:40 EST Ventricular Rate:  75 PR Interval:  174 QRS Duration: 88 QT Interval:  352 QTC Calculation: 393 R Axis:   25 Text Interpretation: Sinus rhythm with frequent Premature ventricular  complexes Cannot rule out Anterior infarct , age undetermined ST & T wave abnormality, consider inferolateral ischemia Abnormal ECG No previous ECGs available Confirmed by Aletta Edouard [phone]) on 09/04/2022 11:59:24 AM  Radiology DG Wrist Complete Right  Result Date: 09/04/2022 CLINICAL DATA:  Trauma. EXAM: RIGHT WRIST - COMPLETE 3+ VIEW COMPARISON:  None Available. FINDINGS: Mildly comminuted fracture of the distal radius with some dorsal displacement. Fracture appears to extend to the articulating surface. Mildly displaced fracture of the ulnar styloid. Degenerative changes between the carpal bones. Irregularity along the dorsal aspect of the triquetrum is age-indeterminate. IMPRESSION: 1. Mildly comminuted and displaced fracture of the distal radius with probable intra-articular extension. 2. Mildly displaced fracture of the ulnar styloid. 3. Questionable injury along the dorsal aspect of the triquetrum which may be chronic. Electronically Signed   By: Markus Daft M.D.   On: 09/04/2022 13:59   DG Shoulder Right  Result Date: 09/04/2022 CLINICAL DATA:  Trauma EXAM: RIGHT SHOULDER - 2+ VIEW COMPARISON:  None Available. FINDINGS: Comminuted, impacted and displaced multipart fractures of the proximal right humerus. There may be an additional fracture of the bony glenoid, not well assessed. No significant arthrosis. Partially imaged right chest unremarkable. IMPRESSION: 1. Comminuted, impacted and displaced multipart fractures of the  proximal right humerus. 2. There may be an additional fracture of the bony glenoid, not well assessed. 3. CT may be used to further evaluate. Electronically Signed   By: Delanna Ahmadi M.D.   On: 09/04/2022 13:55   CT Maxillofacial Wo Contrast  Result Date: 09/04/2022 CLINICAL DATA:  Blunt facial trauma. Fall with right periorbital swelling and pain. EXAM: CT MAXILLOFACIAL WITHOUT CONTRAST TECHNIQUE: Multidetector CT imaging of the maxillofacial structures was performed. Multiplanar CT image reconstructions were also generated. RADIATION DOSE REDUCTION: This exam was performed according to the departmental dose-optimization program which includes automated exposure control, adjustment of the mA and/or kV according to patient size and/or use of iterative reconstruction technique. COMPARISON:  Mandibular x-rays 03/31/2022. FINDINGS: Osseous: No acute facial fracture. Mild chronic degenerative changes of the temporomandibular joints bilaterally. Orbits: Unremarkable.  No evidence of orbital trauma. Sinuses: Paranasal sinuses and visualized portions of the mastoids are unremarkable. Soft tissues: Mild soft tissue swelling superior to the right orbit. Limited intracranial: Unremarkable. IMPRESSION: Mild soft tissue swelling superior to the right orbit without subjacent calvarial or facial fracture. Electronically Signed   By: Emmit Alexanders M.D   On: 09/04/2022 13:52   DG Ankle Complete Right  Result Date: 09/04/2022 CLINICAL DATA:  Post fall, now with right ankle pain. EXAM: RIGHT ANKLE - COMPLETE 3+ VIEW COMPARISON:  None Available. FINDINGS: No fracture or dislocation. Joint spaces appear preserved. The ankle mortise appears preserved. No definite ankle joint effusion. Moderate-sized plantar calcaneal spur. Dystrophic calcifications involving the insertional fibers of the Achilles tendon. Dermal calcifications are noted about the anterior aspect of the lower leg. No radiopaque foreign body. IMPRESSION: 1. No  definite displaced fracture or dislocation. 2. Moderate-sized plantar calcaneal spur. 3. Dystrophic calcifications involving the insertional fibers of the rotator cuff. Electronically Signed   By: Sandi Mariscal M.D.   On: 09/04/2022 13:05   DG Hand Complete Left  Result Date: 09/04/2022 CLINICAL DATA:  Post fall, now with left hand pain. EXAM: LEFT HAND - COMPLETE 3+ VIEW COMPARISON:  09/10/2017 FINDINGS: There is a comminuted impacted fracture involving the distal metaphysis of the second metacarpal. No definitive intra-articular extension. No additional displaced fractures are identified. Sequela of previous sideplate fixation of the  fourth metacarpal without evidence of hardware failure or loosening. Redemonstrated moderate degenerative change involving the STT joints of the base of the thumb, progressed compared to the 08/2017 examination. No erosions. No evidence of chondrocalcinosis. No radiopaque foreign body. IMPRESSION: 1. Comminuted, impacted fracture of the distal metaphysis of the second metacarpal without definitive intra-articular extension. 2. Stable sequela of sideplate fixation of the fourth metacarpal without evidence of hardware failure or loosening. 3. Moderate degenerative change of the STT joints of the base of the thumb. Electronically Signed   By: Sandi Mariscal M.D.   On: 09/04/2022 13:03   DG Humerus Right  Result Date: 09/04/2022 CLINICAL DATA:  Post fall, now with right arm pain. EXAM: RIGHT HUMERUS - 2+ VIEW COMPARISON:  None Available. FINDINGS: There is a comminuted, impacted fracture of the surgical neck of the humerus as well as a obliquely oriented displaced fracture of the greater tuberosity. This finding is associated with inferior subluxation of the humeral head in relation to the glenoid, presumably secondary to the presence of a joint effusion. No additional fractures identified. No radiopaque foreign body. IMPRESSION: Comminuted, impacted fracture of the surgical neck of  the right humerus with obliquely oriented displaced fracture of the greater tuberosity. Electronically Signed   By: Sandi Mariscal M.D.   On: 09/04/2022 12:59   DG Forearm Right  Result Date: 09/04/2022 CLINICAL DATA:  Post fall, now with right wrist and shoulder pain. EXAM: RIGHT FOREARM - 2 VIEW COMPARISON:  None Available. FINDINGS: There is a comminuted displaced fracture of the distal radius with extension to involve both the radiocarpal and distal radioulnar joints. Note is also made of a age-indeterminate though potentially acute, displaced ulnar styloid process fracture. No definite dislocation given obliquity and large field of view. Expected adjacent soft tissue swelling. No radiopaque foreign body. IMPRESSION: 1. Acute comminuted displaced fracture of the distal radius with extension to involve both the radiocarpal and distal radioulnar joints. 2. Age-indeterminate though potentially acute ulnar styloid process fracture. Electronically Signed   By: Sandi Mariscal M.D.   On: 09/04/2022 12:57   CT Head Wo Contrast  Result Date: 09/04/2022 CLINICAL DATA:  Head trauma, minor. EXAM: CT HEAD WITHOUT CONTRAST TECHNIQUE: Contiguous axial images were obtained from the base of the skull through the vertex without intravenous contrast. RADIATION DOSE REDUCTION: This exam was performed according to the departmental dose-optimization program which includes automated exposure control, adjustment of the mA and/or kV according to patient size and/or use of iterative reconstruction technique. COMPARISON:  None Available. FINDINGS: Brain: No midline shift, ventriculomegaly, mass effect, evidence of mass lesion, intracranial hemorrhage or evidence of cortically based acute infarction. Gray-white matter differentiation is within normal limits throughout the brain. Severe, confluent hypoattenuation of the cerebral white matter, likely due to chronic small vessel disease. Vascular: No hyperdense vessel Skull: No calvarial  fracture. Sinuses/Orbits: Paranasal sinuses, mastoids and middle ear cavities are well aerated. Orbits are unremarkable. Other: None. IMPRESSION: 1. No acute intracranial trauma. 2. Severe chronic small-vessel disease. Electronically Signed   By: Emmit Alexanders M.D   On: 09/04/2022 12:36    Procedures Procedures    Medications Ordered in ED Medications  acetaminophen (TYLENOL) tablet 650 mg (has no administration in time range)    ED Course/ Medical Decision Making/ A&P Clinical Course as of 09/04/22 1441  Mon Sep 04, 6608  [id number] 77 year old female here after what sounds like a mechanical fall.  Pain to right shoulder right wrist left hand and right ankle.  X-ray  showing proximal humerus fracture complex wrist fracture left metacarpal fracture.  Will need input from orthopedics and hand regarding disposition.  She does use a cane so sending her home may be difficult. [MB]    Clinical Course User Index [MB] Hayden Rasmussen, MD                             Medical Decision Making Patient here for evaluation of injury sustained in a mechanical fall that occurred earlier this morning.  She tripped fell landing on her right side, she has pain of her right face, right shoulder, right wrist, left hand, and right ankle.  Currently being evaluated by podiatry for her right ankle pain.  Recently received injection to the right ankle.  On exam, patient guarding right arm, unable to abduct right shoulder due to level of pain.  Significant pain on range of motion of the right wrist as well.  There is tenderness and swelling noted at the base of the left index finger as well.  Clinically, have high suspicion that patient has shoulder fracture/dislocation.  Concerning exam for right wrist and left hand as well.  Will need labs and imaging. Patient offered pain medication, declined stating that she only wants to take Tylenol due to the sedating effects of pain medications.  Amount and/or Complexity of  Data Reviewed Labs: ordered.    Details: Labs interpreted by me, leukocytosis noted with white count of 20,000.  History of CLL white count typically around 13,000 this is elevated from her baseline but felt to be secondary to her trauma.  INR 0.9 Radiology: ordered.    Details: X-ray of the right wrist shows a mildly comminuted and displaced fracture of the distal radius with probable intra-articular extension. Right shoulder x-ray shows comminuted impacted displaced fracture proximal right humerus X-ray left hand showsIs a comminuted impacted fracture of the distal metaphysis of the second metacarpal  X-ray of the right ankle, CT head and CT maxillofacial without acute findings ECG/medicine tests: ordered.    Details: EKG shows sinus rhythm with PVCs Discussion of management or test interpretation with external provider(s): Patient with multiple fractures sustained from mechanical fall.  Will consult general orthopedics regarding injuries.  May need to speak with hand surgeon as well.  Consulted general orthopedics, Dr. Mable Fill who graciously agreed to see pt in clinic for f/u of her injuries.  Recommends sugar tong, sling for right shoulder and wrist and volar splint in intrinsic position for left hand.    Patient rechecked by me after application of splints, remains neurovascularly intact.  Agreeable to close orthopedic follow-up.    Risk OTC drugs. Prescription drug management.             Final Clinical Impression(s) / ED Diagnoses Final diagnoses:  Closed displaced fracture of surgical neck of right humerus, unspecified fracture morphology, initial encounter  Closed fracture of right wrist, initial encounter  Closed displaced fracture of shaft of second metacarpal bone of left hand, initial encounter  Fall, initial encounter    Rx / [place] Orders ED Discharge Orders     None         Bufford Lope 09/04/22 2309    Hayden Rasmussen, MD 09/05/22 1036

## 2022-09-04 NOTE — Telephone Encounter (Signed)
ANA remains positive.  dsDNA remains positive but is stable. Complements WNL. ESR and CRP WNL. RF negative. CMP WNL. CBC stable-patient has history of CLL. Protein creatinine ratio WNL.  Dose will be Plaquenil 200 mg once daily.

## 2022-09-06 DIAGNOSIS — S52501A Unspecified fracture of the lower end of right radius, initial encounter for closed fracture: Secondary | ICD-10-CM | POA: Diagnosis not present

## 2022-09-06 DIAGNOSIS — M79641 Pain in right hand: Secondary | ICD-10-CM | POA: Diagnosis not present

## 2022-09-06 DIAGNOSIS — S62301A Unspecified fracture of second metacarpal bone, left hand, initial encounter for closed fracture: Secondary | ICD-10-CM | POA: Diagnosis not present

## 2022-09-06 DIAGNOSIS — M79642 Pain in left hand: Secondary | ICD-10-CM | POA: Diagnosis not present

## 2022-09-07 DIAGNOSIS — M25511 Pain in right shoulder: Secondary | ICD-10-CM | POA: Diagnosis not present

## 2022-09-07 DIAGNOSIS — S42251A Displaced fracture of greater tuberosity of right humerus, initial encounter for closed fracture: Secondary | ICD-10-CM | POA: Diagnosis not present

## 2022-09-08 DIAGNOSIS — S42241A 4-part fracture of surgical neck of right humerus, initial encounter for closed fracture: Secondary | ICD-10-CM | POA: Diagnosis not present

## 2022-09-08 DIAGNOSIS — S42201A Unspecified fracture of upper end of right humerus, initial encounter for closed fracture: Secondary | ICD-10-CM | POA: Diagnosis not present

## 2022-09-08 DIAGNOSIS — G8918 Other acute postprocedural pain: Secondary | ICD-10-CM | POA: Diagnosis not present

## 2022-09-08 HISTORY — PX: REVERSE TOTAL SHOULDER ARTHROPLASTY: SHX2344

## 2022-09-20 DIAGNOSIS — M79641 Pain in right hand: Secondary | ICD-10-CM | POA: Diagnosis not present

## 2022-09-20 DIAGNOSIS — S42251D Displaced fracture of greater tuberosity of right humerus, subsequent encounter for fracture with routine healing: Secondary | ICD-10-CM | POA: Diagnosis not present

## 2022-09-20 DIAGNOSIS — S62301A Unspecified fracture of second metacarpal bone, left hand, initial encounter for closed fracture: Secondary | ICD-10-CM | POA: Diagnosis not present

## 2022-09-20 DIAGNOSIS — Z471 Aftercare following joint replacement surgery: Secondary | ICD-10-CM | POA: Diagnosis not present

## 2022-09-20 DIAGNOSIS — Z96611 Presence of right artificial shoulder joint: Secondary | ICD-10-CM | POA: Diagnosis not present

## 2022-09-20 DIAGNOSIS — M79642 Pain in left hand: Secondary | ICD-10-CM | POA: Diagnosis not present

## 2022-09-20 DIAGNOSIS — S52501A Unspecified fracture of the lower end of right radius, initial encounter for closed fracture: Secondary | ICD-10-CM | POA: Diagnosis not present

## 2022-09-29 NOTE — Progress Notes (Deleted)
Office Visit Note  Patient: Jacqueline Casey             Date of Birth: 11/14/1945           MRN: [id number]             PCP: Jake Samples, PA-C Referring: Jake Samples, [place]* Visit Date: 10/13/2022 Occupation: @GUAROCC$ @  Subjective:  No chief complaint on file.   History of Present Illness: Jacqueline Casey is a 77 y.o. female ***     Activities of Daily Living:  Patient reports morning stiffness for *** {minute/hour:19697}.   Patient {ACTIONS;DENIES/REPORTS:21021675::"Denies"} nocturnal pain.  Difficulty dressing/grooming: {ACTIONS;DENIES/REPORTS:21021675::"Denies"} Difficulty climbing stairs: {ACTIONS;DENIES/REPORTS:21021675::"Denies"} Difficulty getting out of chair: {ACTIONS;DENIES/REPORTS:21021675::"Denies"} Difficulty using hands for taps, buttons, cutlery, and/or writing: {ACTIONS;DENIES/REPORTS:21021675::"Denies"}  No Rheumatology ROS completed.   PMFS History:  Patient Active Problem List   Diagnosis Date Noted   CLL (chronic lymphocytic leukemia) ([place]) 12/29/2019   Leukocytosis 12/03/2019   Baker's cyst of knee 10/18/2011   Knee mass 10/04/2011    Past Medical History:  Diagnosis Date   Baker's cyst of knee 10/18/2011   CLL (chronic lymphocytic leukemia) ([place])    per patient    Hypertension    Osteoarthritis    Thyroid disease     Family History  Problem Relation Age of Onset   Cancer Other    Diabetes Other    Cancer Mother        breast   COPD Mother    Leukemia Father    Osteoporosis Sister    Asthma Sister    Dermatomyositis Sister    COPD Sister    Kidney disease Sister    Cancer Son        lymphoma, colon   Diabetes Son    COPD Daughter    Osteoporosis Daughter    Rheum arthritis Daughter    Past Surgical History:  Procedure Laterality Date   FOOT SURGERY Right 2017   hammer toe    HAND SURGERY Left 2014   finger, hand   SKIN TAG REMOVAL  11/07/2018   stomach and below left eye    THYROID SURGERY     TUBAL  LIGATION     VAGINAL HYSTERECTOMY     Social History   Social History Narrative   Not on file    There is no immunization history on file for this patient.   Objective: Vital Signs: There were no vitals taken for this visit.   Physical Exam   Musculoskeletal Exam: ***  CDAI Exam: CDAI Score: -- Patient Global: --; Provider Global: -- Swollen: --; Tender: -- Joint Exam 10/13/2022   No joint exam has been documented for this visit   There is currently no information documented on the homunculus. Go to the Rheumatology activity and complete the homunculus joint exam.  Investigation: No additional findings.  Imaging: DG Wrist Complete Right  Result Date: 09/04/2022 CLINICAL DATA:  Trauma. EXAM: RIGHT WRIST - COMPLETE 3+ VIEW COMPARISON:  None Available. FINDINGS: Mildly comminuted fracture of the distal radius with some dorsal displacement. Fracture appears to extend to the articulating surface. Mildly displaced fracture of the ulnar styloid. Degenerative changes between the carpal bones. Irregularity along the dorsal aspect of the triquetrum is age-indeterminate. IMPRESSION: 1. Mildly comminuted and displaced fracture of the distal radius with probable intra-articular extension. 2. Mildly displaced fracture of the ulnar styloid. 3. Questionable injury along the dorsal aspect of the triquetrum which may be chronic. Electronically Signed  By: Markus Daft M.D.   On: 09/04/2022 13:59   DG Shoulder Right  Result Date: 09/04/2022 CLINICAL DATA:  Trauma EXAM: RIGHT SHOULDER - 2+ VIEW COMPARISON:  None Available. FINDINGS: Comminuted, impacted and displaced multipart fractures of the proximal right humerus. There may be an additional fracture of the bony glenoid, not well assessed. No significant arthrosis. Partially imaged right chest unremarkable. IMPRESSION: 1. Comminuted, impacted and displaced multipart fractures of the proximal right humerus. 2. There may be an additional fracture of  the bony glenoid, not well assessed. 3. CT may be used to further evaluate. Electronically Signed   By: Delanna Ahmadi M.D.   On: 09/04/2022 13:55   CT Maxillofacial Wo Contrast  Result Date: 09/04/2022 CLINICAL DATA:  Blunt facial trauma. Fall with right periorbital swelling and pain. EXAM: CT MAXILLOFACIAL WITHOUT CONTRAST TECHNIQUE: Multidetector CT imaging of the maxillofacial structures was performed. Multiplanar CT image reconstructions were also generated. RADIATION DOSE REDUCTION: This exam was performed according to the departmental dose-optimization program which includes automated exposure control, adjustment of the mA and/or kV according to patient size and/or use of iterative reconstruction technique. COMPARISON:  Mandibular x-rays 03/31/2022. FINDINGS: Osseous: No acute facial fracture. Mild chronic degenerative changes of the temporomandibular joints bilaterally. Orbits: Unremarkable.  No evidence of orbital trauma. Sinuses: Paranasal sinuses and visualized portions of the mastoids are unremarkable. Soft tissues: Mild soft tissue swelling superior to the right orbit. Limited intracranial: Unremarkable. IMPRESSION: Mild soft tissue swelling superior to the right orbit without subjacent calvarial or facial fracture. Electronically Signed   By: Emmit Alexanders M.D   On: 09/04/2022 13:52   DG Ankle Complete Right  Result Date: 09/04/2022 CLINICAL DATA:  Post fall, now with right ankle pain. EXAM: RIGHT ANKLE - COMPLETE 3+ VIEW COMPARISON:  None Available. FINDINGS: No fracture or dislocation. Joint spaces appear preserved. The ankle mortise appears preserved. No definite ankle joint effusion. Moderate-sized plantar calcaneal spur. Dystrophic calcifications involving the insertional fibers of the Achilles tendon. Dermal calcifications are noted about the anterior aspect of the lower leg. No radiopaque foreign body. IMPRESSION: 1. No definite displaced fracture or dislocation. 2. Moderate-sized  plantar calcaneal spur. 3. Dystrophic calcifications involving the insertional fibers of the rotator cuff. Electronically Signed   By: Sandi Mariscal M.D.   On: 09/04/2022 13:05   DG Hand Complete Left  Result Date: 09/04/2022 CLINICAL DATA:  Post fall, now with left hand pain. EXAM: LEFT HAND - COMPLETE 3+ VIEW COMPARISON:  09/10/2017 FINDINGS: There is a comminuted impacted fracture involving the distal metaphysis of the second metacarpal. No definitive intra-articular extension. No additional displaced fractures are identified. Sequela of previous sideplate fixation of the fourth metacarpal without evidence of hardware failure or loosening. Redemonstrated moderate degenerative change involving the STT joints of the base of the thumb, progressed compared to the 08/2017 examination. No erosions. No evidence of chondrocalcinosis. No radiopaque foreign body. IMPRESSION: 1. Comminuted, impacted fracture of the distal metaphysis of the second metacarpal without definitive intra-articular extension. 2. Stable sequela of sideplate fixation of the fourth metacarpal without evidence of hardware failure or loosening. 3. Moderate degenerative change of the STT joints of the base of the thumb. Electronically Signed   By: Sandi Mariscal M.D.   On: 09/04/2022 13:03   DG Humerus Right  Result Date: 09/04/2022 CLINICAL DATA:  Post fall, now with right arm pain. EXAM: RIGHT HUMERUS - 2+ VIEW COMPARISON:  None Available. FINDINGS: There is a comminuted, impacted fracture of the surgical neck  of the humerus as well as a obliquely oriented displaced fracture of the greater tuberosity. This finding is associated with inferior subluxation of the humeral head in relation to the glenoid, presumably secondary to the presence of a joint effusion. No additional fractures identified. No radiopaque foreign body. IMPRESSION: Comminuted, impacted fracture of the surgical neck of the right humerus with obliquely oriented displaced fracture of  the greater tuberosity. Electronically Signed   By: Sandi Mariscal M.D.   On: 09/04/2022 12:59   DG Forearm Right  Result Date: 09/04/2022 CLINICAL DATA:  Post fall, now with right wrist and shoulder pain. EXAM: RIGHT FOREARM - 2 VIEW COMPARISON:  None Available. FINDINGS: There is a comminuted displaced fracture of the distal radius with extension to involve both the radiocarpal and distal radioulnar joints. Note is also made of a age-indeterminate though potentially acute, displaced ulnar styloid process fracture. No definite dislocation given obliquity and large field of view. Expected adjacent soft tissue swelling. No radiopaque foreign body. IMPRESSION: 1. Acute comminuted displaced fracture of the distal radius with extension to involve both the radiocarpal and distal radioulnar joints. 2. Age-indeterminate though potentially acute ulnar styloid process fracture. Electronically Signed   By: Sandi Mariscal M.D.   On: 09/04/2022 12:57   CT Head Wo Contrast  Result Date: 09/04/2022 CLINICAL DATA:  Head trauma, minor. EXAM: CT HEAD WITHOUT CONTRAST TECHNIQUE: Contiguous axial images were obtained from the base of the skull through the vertex without intravenous contrast. RADIATION DOSE REDUCTION: This exam was performed according to the departmental dose-optimization program which includes automated exposure control, adjustment of the mA and/or kV according to patient size and/or use of iterative reconstruction technique. COMPARISON:  None Available. FINDINGS: Brain: No midline shift, ventriculomegaly, mass effect, evidence of mass lesion, intracranial hemorrhage or evidence of cortically based acute infarction. Gray-white matter differentiation is within normal limits throughout the brain. Severe, confluent hypoattenuation of the cerebral white matter, likely due to chronic small vessel disease. Vascular: No hyperdense vessel Skull: No calvarial fracture. Sinuses/Orbits: Paranasal sinuses, mastoids and middle  ear cavities are well aerated. Orbits are unremarkable. Other: None. IMPRESSION: 1. No acute intracranial trauma. 2. Severe chronic small-vessel disease. Electronically Signed   By: Emmit Alexanders M.D   On: 09/04/2022 12:36    Recent Labs: Lab Results  Component Value Date   WBC 20.6 (H) 09/04/2022   HGB 12.5 09/04/2022   PLT 222 09/04/2022   NA 134 (L) 09/04/2022   K 3.5 09/04/2022   CL 98 09/04/2022   CO2 25 09/04/2022   GLUCOSE 116 (H) 09/04/2022   BUN 20 09/04/2022   CREATININE 0.95 09/04/2022   BILITOT 0.5 09/01/2022   ALKPHOS 80 06/13/2022   AST 15 09/01/2022   ALT 11 09/01/2022   PROT 6.6 09/01/2022   ALBUMIN 4.1 06/13/2022   CALCIUM 9.5 09/04/2022   GFRAA 45 (L) 03/29/2020   QFTBGOLDPLUS NEGATIVE 04/07/2022    Speciality Comments: PLQ eye exam: 03/06/2018 normal. Dr. Marvel Plan. Follow up in 6 months.  Procedures:  No procedures performed Allergies: Sulfa antibiotics   Assessment / Plan:     Visit Diagnoses: Autoimmune disease ([place])  High risk medication use  LFTs abnormal  Primary osteoarthritis of both hands  Synovial cyst of right popliteal space  Synovial cyst of left knee  Granuloma annulare  Age-related osteoporosis without current pathological fracture  CLL (chronic lymphocytic leukemia) ([facility])  History of hypothyroidism  History of hypertension  Orders: No orders of the defined types were placed in  this encounter.  No orders of the defined types were placed in this encounter.   Face-to-face time spent with patient was *** minutes. Greater than 50% of time was spent in counseling and coordination of care.  Follow-Up Instructions: No follow-ups on file.   Ofilia Neas, PA-C  Note - This record has been created using Dragon software.  Chart creation errors have been sought, but may not always  have been located. Such creation errors do not reflect on  the standard of medical care.,

## 2022-10-05 ENCOUNTER — Telehealth: Payer: Self-pay

## 2022-10-05 NOTE — Telephone Encounter (Signed)
Patient called in today wanting to reschedule appointment. She broke her shoulder and just had surgery. She stated that she has not started the plaquenil or had an eye exam and will not start it till after her physical therapy is complete.

## 2022-10-13 ENCOUNTER — Ambulatory Visit: Payer: Medicare HMO | Admitting: Physician Assistant

## 2022-10-13 DIAGNOSIS — M7122 Synovial cyst of popliteal space [Baker], left knee: Secondary | ICD-10-CM

## 2022-10-13 DIAGNOSIS — Z79899 Other long term (current) drug therapy: Secondary | ICD-10-CM

## 2022-10-13 DIAGNOSIS — M810 Age-related osteoporosis without current pathological fracture: Secondary | ICD-10-CM

## 2022-10-13 DIAGNOSIS — G8929 Other chronic pain: Secondary | ICD-10-CM

## 2022-10-13 DIAGNOSIS — L920 Granuloma annulare: Secondary | ICD-10-CM

## 2022-10-13 DIAGNOSIS — C9110 Chronic lymphocytic leukemia of B-cell type not having achieved remission: Secondary | ICD-10-CM

## 2022-10-13 DIAGNOSIS — Z8679 Personal history of other diseases of the circulatory system: Secondary | ICD-10-CM

## 2022-10-13 DIAGNOSIS — M7121 Synovial cyst of popliteal space [Baker], right knee: Secondary | ICD-10-CM

## 2022-10-13 DIAGNOSIS — M19041 Primary osteoarthritis, right hand: Secondary | ICD-10-CM

## 2022-10-13 DIAGNOSIS — M359 Systemic involvement of connective tissue, unspecified: Secondary | ICD-10-CM

## 2022-10-13 DIAGNOSIS — R7989 Other specified abnormal findings of blood chemistry: Secondary | ICD-10-CM

## 2022-10-13 DIAGNOSIS — Z8639 Personal history of other endocrine, nutritional and metabolic disease: Secondary | ICD-10-CM

## 2022-10-18 DIAGNOSIS — S52501A Unspecified fracture of the lower end of right radius, initial encounter for closed fracture: Secondary | ICD-10-CM | POA: Diagnosis not present

## 2022-10-18 DIAGNOSIS — S62301A Unspecified fracture of second metacarpal bone, left hand, initial encounter for closed fracture: Secondary | ICD-10-CM | POA: Diagnosis not present

## 2022-10-23 DIAGNOSIS — Z96611 Presence of right artificial shoulder joint: Secondary | ICD-10-CM | POA: Diagnosis not present

## 2022-10-23 DIAGNOSIS — Z471 Aftercare following joint replacement surgery: Secondary | ICD-10-CM | POA: Diagnosis not present

## 2022-11-10 DIAGNOSIS — Z96611 Presence of right artificial shoulder joint: Secondary | ICD-10-CM | POA: Diagnosis not present

## 2022-11-10 DIAGNOSIS — M9907 Segmental and somatic dysfunction of upper extremity: Secondary | ICD-10-CM | POA: Diagnosis not present

## 2022-11-14 DIAGNOSIS — Z79899 Other long term (current) drug therapy: Secondary | ICD-10-CM | POA: Diagnosis not present

## 2022-11-16 ENCOUNTER — Telehealth: Payer: Self-pay | Admitting: *Deleted

## 2022-11-16 ENCOUNTER — Encounter: Payer: Self-pay | Admitting: *Deleted

## 2022-11-16 NOTE — Telephone Encounter (Signed)
Patient advised ok to initiate plaquenil.  Recheck CBC and CMP in 1 month, 3 months, then every 5 months.  Ok to keep appointment in May.

## 2022-11-16 NOTE — Telephone Encounter (Signed)
Patient states she was supposed to start PLQ but fell and broke her shoulder and had to have a shoulder replacement. Patient states she did not start the PLQ as she had to delay getting her eye exam. Patient states she had her eye exam yesterday. She states she would like to know if you want her to start the medication prior to her May appointment and do you want her to have labs prior to starting. Please advise.

## 2022-11-16 NOTE — Telephone Encounter (Signed)
Ok to initiate plaquenil.  Recheck CBC and CMP in 1 month, 3 months, then every 5 months.  Ok to keep appointment in May.

## 2022-11-17 DIAGNOSIS — M9907 Segmental and somatic dysfunction of upper extremity: Secondary | ICD-10-CM | POA: Diagnosis not present

## 2022-11-17 DIAGNOSIS — Z96611 Presence of right artificial shoulder joint: Secondary | ICD-10-CM | POA: Diagnosis not present

## 2022-11-20 ENCOUNTER — Telehealth: Payer: Self-pay | Admitting: Rheumatology

## 2022-11-20 NOTE — Telephone Encounter (Signed)
Patient left a voicemail requesting a return call to confirm we received her Plaquenil eye exam.

## 2022-11-21 NOTE — Telephone Encounter (Signed)
Patient advised we did not received her PLQ eye exam. Patient states she has a copy of her PLQ eye exam form. Patient will send a photo copy of it through my chart.

## 2022-11-23 DIAGNOSIS — M9907 Segmental and somatic dysfunction of upper extremity: Secondary | ICD-10-CM | POA: Diagnosis not present

## 2022-11-23 DIAGNOSIS — Z96611 Presence of right artificial shoulder joint: Secondary | ICD-10-CM | POA: Diagnosis not present

## 2022-11-29 DIAGNOSIS — S52501A Unspecified fracture of the lower end of right radius, initial encounter for closed fracture: Secondary | ICD-10-CM | POA: Diagnosis not present

## 2022-11-29 DIAGNOSIS — S62301A Unspecified fracture of second metacarpal bone, left hand, initial encounter for closed fracture: Secondary | ICD-10-CM | POA: Diagnosis not present

## 2022-12-01 DIAGNOSIS — Z96611 Presence of right artificial shoulder joint: Secondary | ICD-10-CM | POA: Diagnosis not present

## 2022-12-01 DIAGNOSIS — M9907 Segmental and somatic dysfunction of upper extremity: Secondary | ICD-10-CM | POA: Diagnosis not present

## 2022-12-08 ENCOUNTER — Inpatient Hospital Stay: Payer: Medicare HMO | Attending: Hematology

## 2022-12-08 DIAGNOSIS — M8580 Other specified disorders of bone density and structure, unspecified site: Secondary | ICD-10-CM | POA: Insufficient documentation

## 2022-12-08 DIAGNOSIS — C9110 Chronic lymphocytic leukemia of B-cell type not having achieved remission: Secondary | ICD-10-CM | POA: Diagnosis not present

## 2022-12-08 DIAGNOSIS — Z800 Family history of malignant neoplasm of digestive organs: Secondary | ICD-10-CM | POA: Diagnosis not present

## 2022-12-08 DIAGNOSIS — Z803 Family history of malignant neoplasm of breast: Secondary | ICD-10-CM | POA: Insufficient documentation

## 2022-12-08 DIAGNOSIS — Z96611 Presence of right artificial shoulder joint: Secondary | ICD-10-CM | POA: Diagnosis not present

## 2022-12-08 DIAGNOSIS — E538 Deficiency of other specified B group vitamins: Secondary | ICD-10-CM | POA: Diagnosis not present

## 2022-12-08 DIAGNOSIS — Z806 Family history of leukemia: Secondary | ICD-10-CM | POA: Insufficient documentation

## 2022-12-08 DIAGNOSIS — M069 Rheumatoid arthritis, unspecified: Secondary | ICD-10-CM | POA: Diagnosis not present

## 2022-12-08 DIAGNOSIS — M9907 Segmental and somatic dysfunction of upper extremity: Secondary | ICD-10-CM | POA: Diagnosis not present

## 2022-12-08 DIAGNOSIS — Z807 Family history of other malignant neoplasms of lymphoid, hematopoietic and related tissues: Secondary | ICD-10-CM | POA: Insufficient documentation

## 2022-12-08 DIAGNOSIS — Z87891 Personal history of nicotine dependence: Secondary | ICD-10-CM | POA: Insufficient documentation

## 2022-12-08 LAB — CBC WITH DIFFERENTIAL/PLATELET
Abs Immature Granulocytes: 0.03 10*3/uL (ref 0.00–0.07)
Basophils Absolute: 0.1 10*3/uL (ref 0.0–0.1)
Basophils Relative: 1 %
Eosinophils Absolute: 0.2 10*3/uL (ref 0.0–0.5)
Eosinophils Relative: 2 %
HCT: 37.9 % (ref 36.0–46.0)
Hemoglobin: 12.2 g/dL (ref 12.0–15.0)
Immature Granulocytes: 0 %
Lymphocytes Relative: 49 %
Lymphs Abs: 5 10*3/uL — ABNORMAL HIGH (ref 0.7–4.0)
MCH: 28.9 pg (ref 26.0–34.0)
MCHC: 32.2 g/dL (ref 30.0–36.0)
MCV: 89.8 fL (ref 80.0–100.0)
Monocytes Absolute: 0.6 10*3/uL (ref 0.1–1.0)
Monocytes Relative: 6 %
Neutro Abs: 4.2 10*3/uL (ref 1.7–7.7)
Neutrophils Relative %: 42 %
Platelets: 221 10*3/uL (ref 150–400)
RBC: 4.22 MIL/uL (ref 3.87–5.11)
RDW: 12.8 % (ref 11.5–15.5)
WBC: 10 10*3/uL (ref 4.0–10.5)
nRBC: 0 % (ref 0.0–0.2)

## 2022-12-08 LAB — COMPREHENSIVE METABOLIC PANEL
ALT: 12 U/L (ref 0–44)
AST: 17 U/L (ref 15–41)
Albumin: 3.7 g/dL (ref 3.5–5.0)
Alkaline Phosphatase: 80 U/L (ref 38–126)
Anion gap: 9 (ref 5–15)
BUN: 17 mg/dL (ref 8–23)
CO2: 28 mmol/L (ref 22–32)
Calcium: 8.7 mg/dL — ABNORMAL LOW (ref 8.9–10.3)
Chloride: 101 mmol/L (ref 98–111)
Creatinine, Ser: 1.13 mg/dL — ABNORMAL HIGH (ref 0.44–1.00)
GFR, Estimated: 50 mL/min — ABNORMAL LOW (ref >60)
Glucose, Bld: 94 mg/dL (ref 70–99)
Potassium: 3.8 mmol/L (ref 3.5–5.1)
Sodium: 138 mmol/L (ref 135–145)
Total Bilirubin: 0.4 mg/dL (ref 0.3–1.2)
Total Protein: 6.5 g/dL (ref 6.5–8.1)

## 2022-12-08 LAB — LACTATE DEHYDROGENASE: LDH: 157 U/L (ref 98–192)

## 2022-12-11 DIAGNOSIS — Z471 Aftercare following joint replacement surgery: Secondary | ICD-10-CM | POA: Diagnosis not present

## 2022-12-11 DIAGNOSIS — Z96611 Presence of right artificial shoulder joint: Secondary | ICD-10-CM | POA: Diagnosis not present

## 2022-12-11 DIAGNOSIS — M9907 Segmental and somatic dysfunction of upper extremity: Secondary | ICD-10-CM | POA: Diagnosis not present

## 2022-12-12 ENCOUNTER — Other Ambulatory Visit: Payer: Medicare HMO

## 2022-12-18 DIAGNOSIS — M7661 Achilles tendinitis, right leg: Secondary | ICD-10-CM | POA: Diagnosis not present

## 2022-12-18 DIAGNOSIS — M79671 Pain in right foot: Secondary | ICD-10-CM | POA: Diagnosis not present

## 2022-12-18 NOTE — Progress Notes (Signed)
Office Visit Note  Patient: Jacqueline Casey             Date of Birth: March 10, 1946           MRN: [id number]             PCP: Avis Epley, PA-C Referring: Avis Epley, [place]* Visit Date: 01/01/2023 Occupation: @GUAROCC @  Subjective:  Recent fall    History of Present Illness: Jacqueline Casey is a 77 y.o. female with history of autoimmune.  Patient has been taking Plaquenil 200 mg 1 tablet by mouth daily.  Overall she has been tolerating Plaquenil without any side effects.  She initiated Plaquenil between 5 to 6 weeks ago.  She has noticed improvement in her joint pain and inflammation since initiating Plaquenil. Patient had a fall on 09/04/2022 at which time she was evaluated in the emergency department.  She was found to have a closed displaced fracture of the right humerus and a closed fracture of the right wrist.  She underwent a reverse total shoulder replacement by Dr. Rennis Chris.  She continues to use a walker to assist with ambulation.  She is overdue to update a DEXA.    Activities of Daily Living:  Patient reports morning stiffness for 0 minutes.   Patient Reports nocturnal pain.  Difficulty dressing/grooming: Reports Difficulty climbing stairs: Reports Difficulty getting out of chair: Reports Difficulty using hands for taps, buttons, cutlery, and/or writing: Denies  Review of Systems  Constitutional:  Positive for fatigue.  HENT:  Positive for mouth dryness. Negative for mouth sores.   Eyes:  Negative for dryness.  Respiratory:  Negative for shortness of breath.   Cardiovascular:  Negative for chest pain and palpitations.  Gastrointestinal:  Positive for diarrhea. Negative for blood in stool and constipation.  Endocrine: Negative for increased urination.  Genitourinary:  Negative for involuntary urination.  Musculoskeletal:  Positive for joint pain, gait problem and joint pain. Negative for joint swelling, myalgias, muscle weakness, morning stiffness, muscle  tenderness and myalgias.  Skin:  Positive for hair loss and sensitivity to sunlight. Negative for color change and rash.  Allergic/Immunologic: Negative for susceptible to infections.  Neurological:  Positive for dizziness. Negative for headaches.  Hematological:  Negative for swollen glands.  Psychiatric/Behavioral:  Positive for sleep disturbance. Negative for depressed mood. The patient is not nervous/anxious.     PMFS History:  Patient Active Problem List   Diagnosis Date Noted   CLL (chronic lymphocytic leukemia) ([facility]) 12/29/2019   Leukocytosis 12/03/2019   Baker's cyst of knee 10/18/2011   Knee mass 10/04/2011    Past Medical History:  Diagnosis Date   Baker's cyst of knee 10/18/2011   CLL (chronic lymphocytic leukemia) ([facility])    per patient    Hypertension    Osteoarthritis    Thyroid disease     Family History  Problem Relation Age of Onset   Cancer Other    Diabetes Other    Cancer Mother        breast   COPD Mother    Leukemia Father    Osteoporosis Sister    Asthma Sister    Dermatomyositis Sister    COPD Sister    Kidney disease Sister    Cancer Son        lymphoma, colon   Diabetes Son    COPD Daughter    Osteoporosis Daughter    Rheum arthritis Daughter    Past Surgical History:  Procedure Laterality Date   FOOT  SURGERY Right 2017   hammer toe    HAND SURGERY Left 2014   finger, hand   REVERSE TOTAL SHOULDER ARTHROPLASTY Right 09/08/2022   SKIN TAG REMOVAL  11/07/2018   stomach and below left eye    THYROID SURGERY     TUBAL LIGATION     VAGINAL HYSTERECTOMY     Social History   Social History Narrative   Not on file    There is no immunization history on file for this patient.   Objective: Vital Signs: BP 139/66 (BP Location: Left Wrist, Patient Position: Sitting, Cuff Size: Normal)   Pulse 76   Resp 16   Ht 5' 8.5" (1.74 m)   Wt 240 lb 3.2 oz (109 kg)   BMI 35.99 kg/m    Physical Exam Vitals and nursing note reviewed.   Constitutional:      Appearance: She is well-developed.  HENT:     Head: Normocephalic and atraumatic.  Eyes:     Conjunctiva/sclera: Conjunctivae normal.  Cardiovascular:     Rate and Rhythm: Normal rate and regular rhythm.     Heart sounds: Normal heart sounds.  Pulmonary:     Effort: Pulmonary effort is normal.     Breath sounds: Normal breath sounds.  Abdominal:     General: Bowel sounds are normal.     Palpations: Abdomen is soft.  Musculoskeletal:     Cervical back: Normal range of motion.  Skin:    General: Skin is warm and dry.     Capillary Refill: Capillary refill takes less than 2 seconds.  Neurological:     Mental Status: She is alert and oriented to person, place, and time.  Psychiatric:        Behavior: Behavior normal.      Musculoskeletal Exam: C-spine limited ROM with lateral rotation.  Postural thoracic kyphosis.  Right shoulder replacement has slightly limited range of motion.  Left hip has good range of motion.  Tenderness of the right wrist.  No tenderness or synovitis over MCP joints.  PIP and DIP thickening consistent with osteoarthritis of both hands.  CMC joint prominence noted bilaterally.  Good range of motion of both knee joints with no warmth or effusion on examination today.  Tenderness of the right ankle.  Inflammation noted in the posterior aspect of the right heel.   CDAI Exam: CDAI Score: -- Patient Global: --; Provider Global: -- Swollen: --; Tender: -- Joint Exam 01/01/2023   No joint exam has been documented for this visit   There is currently no information documented on the homunculus. Go to the Rheumatology activity and complete the homunculus joint exam.  Investigation: No additional findings.  Imaging: No results found.  Recent Labs: Lab Results  Component Value Date   WBC 9.7 12/22/2022   HGB 12.8 12/22/2022   PLT 249 12/22/2022   NA 142 12/22/2022   K 3.6 12/22/2022   CL 103 12/22/2022   CO2 33 (H) 12/22/2022    GLUCOSE 101 (H) 12/22/2022   BUN 16 12/22/2022   CREATININE 1.02 (H) 12/22/2022   BILITOT 0.4 12/22/2022   ALKPHOS 80 12/08/2022   AST 14 12/22/2022   ALT 7 12/22/2022   PROT 6.4 12/22/2022   ALBUMIN 3.7 12/08/2022   CALCIUM 9.2 12/22/2022   GFRAA 45 (L) 03/29/2020   QFTBGOLDPLUS NEGATIVE 04/07/2022    Speciality Comments: PLQ eye exam: 11/14/2022 WNL. [facility]. Follow up in 6 months.  Procedures:  No procedures performed Allergies: Sulfa antibiotics  Assessment / Plan:     Visit Diagnoses: Autoimmune disease ([facility]) - ANA 1:160 NH, dsDNA+, inflammatory arthritis. X-rays obtained in 2019 showed MCP narrowing consistent with inflammatory arthritis: Patient has been taking Plaquenil 200 mg 1 tablet by mouth daily for the past 5 to 6 weeks.  Overall she has been tolerating Plaquenil without any side effects.  She has noticed an improvement in the joint pain and inflammation she was experiencing.  She has noticed less inflammation in her right wrist and has less stiffness in her hands.  She has also noticed less swelling in her knee joints.  She is willing to give Plaquenil more time and for [place] to reassess for the full efficacy in 3 months.  She will notify us if she develops any new or worsening symptoms in the meantime.  High risk medication use - Plaquenil 200 mg 1 tablet by mouth daily.  PLQ eye exam: 11/14/2022 WNL. [facility]  CBC and CMP were drawn on 12/22/2022.  Plan to update CBC and CMP at her follow-up visit in 3 months then every 5 months.  LFTs abnormal: LFTs were within normal limits on 12/22/2022.  Chronic pain of right ankle - Under care of Dr. Charlsie Merles.  She had a cortisone injection on 08/30/22.  Primary osteoarthritis of both hands: CMC, PIP, DIP thickening consistent with osteoarthritis of both hands.  She has noticed less inflammation and pain in her hands since initiating Plaquenil.  No synovitis was noted today.  Age-related osteoporosis without  current pathological fracture - DEXA on 10/28/20: The BMD measured at Femur Neck Left is 0.697 g/cm2 with a T-score of -2.5. Patient has declined treatment in the past. She is taking a calcium and vitamin D supplement.  Due to update DEXA--order placed today.  She will require treatment for management of osteoporosis especially given recent fall leading to a humerus fracture in January 2024.  Briefly discussed treatment options today including Forteo, evenity, and Prolia.  She is apprehensive to start any medications at this time and would like to first have an updated bone density.  Plan to discuss osteoporosis treatment options at her upcoming follow-up visit.  Plan: DG BONE DENSITY (DXA)  S/P reverse total shoulder arthroplasty, right: Fall on 09/04/2022.  Underwent surgery performed by Dr. Rennis Chris.   Other medical conditions are listed as follows:   CLL (chronic lymphocytic leukemia) (HCC): Under the care of Dr. Drucilla Chalet office visit note from 12/19/22-no indication for treatment at this time.   History of hypothyroidism  History of hypertension: Blood pressure was elevated today in the office.  Her blood pressure was rechecked prior to leaving and was 139/66.  Synovial cyst of right popliteal space  Synovial cyst of left knee  Granuloma annulare    Orders: Orders Placed This Encounter  Procedures   DG BONE DENSITY (DXA)   No orders of the defined types were placed in this encounter.   Follow-Up Instructions: Return in 3 months (on 04/03/2023) for Autoimmune Disease, Osteoarthritis.   Gearldine Bienenstock, PA-C  Note - This record has been created using Dragon software.  Chart creation errors have been sought, but may not always  have been located. Such creation errors do not reflect on  the standard of medical care.

## 2022-12-18 NOTE — Progress Notes (Signed)
[facility] [address] [place] [address]    Clinic Day:  12/19/2022  Referring physician: Avis Epley, PA*  Patient Care Team: Ladon Applebaum as PCP - General (Family Medicine)   ASSESSMENT & PLAN:   Assessment: 1.  Clinical stage 0 CLL: -She was evaluated at the request of Dr. Corliss Skains for elevated white count, predominantly lymphocytes since January 2019. -Denies any B symptoms.  Physical exam does not show any lymphadenopathy or splenomegaly. -We reviewed results of flow cytometry from 12/03/2019.  Monoclonal B-cell population with coexpression of CD5 comprises 36% of lymphocytes.  Overall the morphology and immunophenotype favor CLL.  However there is a small chance of it being atypical mantle cell lymphoma.   2.  Rheumatoid arthritis: -Small joints of the hands involved.  Plaquenil did not help. -She is taking Tylenol and NSAIDs.   3.  Osteopenia: -DEXA scan on 02/07/2018 shows T score -2.3. -Vitamin D level was borderline low at 24.   4.  Vitamin B12 deficiency: -B12 level was low at 177.  She was given B12 injection today.  She was told to start taking B12 1 mg tablet daily.    Plan: 1.  Clinical stage 0 CLL: - She does not report any fevers, night sweats or weight loss in the last 6 months. - Physical exam: No palpable adenopathy. - She sustained a fall on 09/04/2022 and had to have right shoulder surgery done. - Labs from 12/08/2022: Normal LDH.  White count is 10 with ALC of 5000.  Platelet count and hemoglobin normal. - No indication for treatment at this time.  RTC 6 months for follow-up.   2.  Rheumatoid arthritis: - Plaquenil was started about a month ago.   3.  Osteopenia: - Continue vitamin D supplements.  Vitamin D last level was normal.     Orders Placed This Encounter  Procedures   CBC with Differential/Platelet    Standing Status:   Future    Standing Expiration Date:   12/19/2023    Order Specific  Question:   Release to patient    Answer:   Immediate   Comprehensive metabolic panel    Standing Status:   Future    Standing Expiration Date:   12/19/2023    Order Specific Question:   Release to patient    Answer:   Immediate   Lactate dehydrogenase    Standing Status:   Future    Standing Expiration Date:   12/19/2023    Order Specific Question:   Release to patient    Answer:   Immediate      I,Katie Daubenspeck,acting as a scribe for [name], MD.,have documented all relevant documentation on the behalf of [name], MD,as directed by  [name], MD while in the presence of [name], MD.   I, [name] MD, have reviewed the above documentation for accuracy and completeness, and I agree with the above.   [name], MD   4/30/20244:56 PM  CHIEF COMPLAINT:   Diagnosis: CLL    Cancer Staging  No matching staging information was found for the patient.   Prior Therapy: none  Current Therapy:  surveillance   HISTORY OF PRESENT ILLNESS:   Oncology History   No history exists.     INTERVAL HISTORY:   Jacqueline Casey is a 77 y.o. female presenting to clinic today for follow up of CLL. She was last seen by me on 06/20/22.  Of note since her  last visit, she presented to the ED on 09/04/22 following a mechanical fall. She was found to have fractures within her right humerus, right wrist, and left hand.  Today, she states that she is doing well overall. Her appetite level is at 100%. Her energy level is at 70%.  PAST MEDICAL HISTORY:   Past Medical History: Past Medical History:  Diagnosis Date   Baker's cyst of knee 10/18/2011   CLL (chronic lymphocytic leukemia) (HCC)    per patient    Hypertension    Osteoarthritis    Thyroid disease     Surgical History: Past Surgical History:  Procedure Laterality Date   FOOT SURGERY Right 2017   hammer toe    HAND SURGERY Left 2014   finger, hand   SKIN TAG REMOVAL   11/07/2018   stomach and below left eye    THYROID SURGERY     TUBAL LIGATION     VAGINAL HYSTERECTOMY      Social History: Social History   Socioeconomic History   Marital status: Married    Spouse name: Not on file   Number of children: 3   Years of education: 12   Highest education level: Not on file  Occupational History   Not on file  Tobacco Use   Smoking status: Former    Packs/day: 1.50    Years: 15.00    Additional pack years: 0.00    Total pack years: 22.50    Types: Cigarettes    Quit date: 07/1999    Years since quitting: 23.4    Passive exposure: Never   Smokeless tobacco: Never  Vaping Use   Vaping Use: Never used  Substance and Sexual Activity   Alcohol use: No   Drug use: No   Sexual activity: Not on file  Other Topics Concern   Not on file  Social History Narrative   Not on file   Social Determinants of Health   Financial Resource Strain: Not on file  Food Insecurity: Not on file  Transportation Needs: No Transportation Needs (10/11/2020)   PRAPARE - Administrator, Civil Service (Medical): No    Lack of Transportation (Non-Medical): No  Physical Activity: Inactive (10/11/2020)   Exercise Vital Sign    Days of Exercise per Week: 0 days    Minutes of Exercise per Session: 0 min  Stress: Not on file  Social Connections: Not on file  Intimate Partner Violence: Not At Risk (10/11/2020)   Humiliation, Afraid, Rape, and Kick questionnaire    Fear of Current or Ex-Partner: No    Emotionally Abused: No    Physically Abused: No    Sexually Abused: No    Family History: Family History  Problem Relation Age of Onset   Cancer Other    Diabetes Other    Cancer Mother        breast   COPD Mother    Leukemia Father    Osteoporosis Sister    Asthma Sister    Dermatomyositis Sister    COPD Sister    Kidney disease Sister    Cancer Son        lymphoma, colon   Diabetes Son    COPD Daughter    Osteoporosis Daughter    Rheum  arthritis Daughter     Current Medications:  Current Outpatient Medications:    b complex vitamins capsule, Take 1 capsule by mouth daily., Disp: , Rfl:    CALCIUM PO, Take by mouth daily., Disp: ,  Rfl:    cetirizine (ZYRTEC) 10 MG tablet, Take 10 mg by mouth daily., Disp: , Rfl:    diclofenac sodium (VOLTAREN) 1 % GEL, Apply 3 grams to 3 large joints up to 3 times daily., Disp: 3 Tube, Rfl: 3   fluticasone (CUTIVATE) 0.05 % cream, Apply topically 2 (two) times daily as needed., Disp: , Rfl:    hydrochlorothiazide (HYDRODIURIL) 12.5 MG tablet, Take 12.5 mg by mouth daily., Disp: , Rfl:    HYDROcodone-acetaminophen (NORCO/VICODIN) 5-325 MG tablet, Take one tab po q 6 hrs prn pain, Disp: 15 tablet, Rfl: 0   hydroxychloroquine (PLAQUENIL) 200 MG tablet, Take 1 tablet (200 mg total) by mouth daily., Disp: 90 tablet, Rfl: 0   ibuprofen (ADVIL,MOTRIN) 200 MG tablet, Take 200 mg by mouth every 6 (six) hours as needed., Disp: , Rfl:    ketoconazole (NIZORAL) 2 % cream, Apply topically 2 (two) times daily as needed., Disp: , Rfl:    levothyroxine (SYNTHROID, LEVOTHROID) 88 MCG tablet, Take 88 mcg by mouth daily., Disp: , Rfl:    TART CHERRY PO, Take by mouth., Disp: , Rfl:    triamcinolone cream (KENALOG) 0.1 %, as needed., Disp: , Rfl: 3   TURMERIC CURCUMIN PO, Take by mouth., Disp: , Rfl:    vitamin B-12 (CYANOCOBALAMIN) 500 MCG tablet, Take 500 mcg by mouth 2 (two) times daily., Disp: , Rfl:    VITAMIN D PO, Take by mouth daily., Disp: , Rfl:    Allergies: Allergies  Allergen Reactions   Sulfa Antibiotics     REVIEW OF SYSTEMS:   Review of Systems  Constitutional:  Negative for chills, fatigue and fever.  HENT:   Negative for lump/mass, mouth sores, nosebleeds, sore throat and trouble swallowing.   Eyes:  Negative for eye problems.  Respiratory:  Negative for cough and shortness of breath.   Cardiovascular:  Negative for chest pain, leg swelling and palpitations.  Gastrointestinal:   Negative for abdominal pain, constipation, diarrhea, nausea and vomiting.  Genitourinary:  Negative for bladder incontinence, difficulty urinating, dysuria, frequency, hematuria and nocturia.   Musculoskeletal:  Negative for arthralgias, back pain, flank pain, myalgias and neck pain.  Skin:  Negative for itching and rash.  Neurological:  Positive for numbness. Negative for dizziness and headaches.  Hematological:  Does not bruise/bleed easily.  Psychiatric/Behavioral:  Negative for depression, sleep disturbance and suicidal ideas. The patient is not nervous/anxious.   All other systems reviewed and are negative.    VITALS:   Blood pressure (!) 146/74, pulse 78, temperature 98 F (36.7 C), temperature source Tympanic, resp. rate 18, height 5\' 8"  (1.727 m), weight 238 lb 8 oz (108.2 kg), SpO2 94 %.  Wt Readings from Last 3 Encounters:  12/19/22 238 lb 8 oz (108.2 kg)  09/04/22 233 lb (105.7 kg)  09/01/22 235 lb 6.4 oz (106.8 kg)    Body mass index is 36.26 kg/m.  Performance status (ECOG): 1 - Symptomatic but completely ambulatory  PHYSICAL EXAM:   Physical Exam Vitals and nursing note reviewed. Exam conducted with a chaperone present.  Constitutional:      Appearance: Normal appearance.  Cardiovascular:     Rate and Rhythm: Normal rate and regular rhythm.     Pulses: Normal pulses.     Heart sounds: Normal heart sounds.  Pulmonary:     Effort: Pulmonary effort is normal.     Breath sounds: Normal breath sounds.  Abdominal:     Palpations: Abdomen is soft. There is no hepatomegaly,  splenomegaly or mass.     Tenderness: There is no abdominal tenderness.  Musculoskeletal:     Right lower leg: No edema.     Left lower leg: No edema.  Lymphadenopathy:     Cervical: No cervical adenopathy.     Right cervical: No superficial, deep or posterior cervical adenopathy.    Left cervical: No superficial, deep or posterior cervical adenopathy.     Upper Body:     Right upper body: No  supraclavicular or axillary adenopathy.     Left upper body: No supraclavicular or axillary adenopathy.  Neurological:     General: No focal deficit present.     Mental Status: She is alert and oriented to person, place, and time.  Psychiatric:        Mood and Affect: Mood normal.        Behavior: Behavior normal.     LABS:      Latest Ref Rng & Units 12/08/2022   10:45 AM 09/04/2022    2:46 PM 09/01/2022    9:30 AM  CBC  WBC 4.0 - 10.5 K/uL 10.0  20.6  13.3   Hemoglobin 12.0 - 15.0 g/dL 16.1  09.6  04.5   Hematocrit 36.0 - 46.0 % 37.9  38.5  38.9   Platelets 150 - 400 K/uL 221  222  242       Latest Ref Rng & Units 12/08/2022   10:45 AM 09/04/2022    2:46 PM 09/01/2022    9:30 AM  CMP  Glucose 70 - 99 mg/dL 94  409  96   BUN 8 - 23 mg/dL 17  20  12    Creatinine 0.44 - 1.00 mg/dL 8.11  9.14  7.82   Sodium 135 - 145 mmol/L 138  134  141   Potassium 3.5 - 5.1 mmol/L 3.8  3.5  4.2   Chloride 98 - 111 mmol/L 101  98  102   CO2 22 - 32 mmol/L 28  25  29    Calcium 8.9 - 10.3 mg/dL 8.7  9.5  9.8   Total Protein 6.5 - 8.1 g/dL 6.5   6.6   Total Bilirubin 0.3 - 1.2 mg/dL 0.4   0.5   Alkaline Phos 38 - 126 U/L 80     AST 15 - 41 U/L 17   15   ALT 0 - 44 U/L 12   11      No results found for: "CEA1", "CEA" / No results found for: "CEA1", "CEA" No results found for: "PSA1" No results found for: "NFA213" No results found for: "CAN125"  Lab Results  Component Value Date   ALBUMINELP 4.1 04/07/2022   A1GS 0.3 04/07/2022   A2GS 0.7 04/07/2022   BETS 0.5 04/07/2022   BETA2SER 0.4 04/07/2022   GAMS 0.7 (L) 04/07/2022   SPEI  04/07/2022     Comment:     . Consistent with hypogammaglobulinemia. Serum free light  chains or urine immunofixation should be considered if  plasma cell dyscrasias are a possible clinical  diagnosis. .    No results found for: "TIBC", "FERRITIN", "IRONPCTSAT" Lab Results  Component Value Date   LDH 157 12/08/2022   LDH 155 06/13/2022   LDH 161  10/26/2021     STUDIES:   No results found.

## 2022-12-19 ENCOUNTER — Inpatient Hospital Stay: Payer: Medicare HMO | Admitting: Hematology

## 2022-12-19 VITALS — BP 146/74 | HR 78 | Temp 98.0°F | Resp 18 | Ht 68.0 in | Wt 238.5 lb

## 2022-12-19 DIAGNOSIS — M069 Rheumatoid arthritis, unspecified: Secondary | ICD-10-CM | POA: Diagnosis not present

## 2022-12-19 DIAGNOSIS — E538 Deficiency of other specified B group vitamins: Secondary | ICD-10-CM | POA: Diagnosis not present

## 2022-12-19 DIAGNOSIS — Z87891 Personal history of nicotine dependence: Secondary | ICD-10-CM | POA: Diagnosis not present

## 2022-12-19 DIAGNOSIS — Z807 Family history of other malignant neoplasms of lymphoid, hematopoietic and related tissues: Secondary | ICD-10-CM | POA: Diagnosis not present

## 2022-12-19 DIAGNOSIS — Z803 Family history of malignant neoplasm of breast: Secondary | ICD-10-CM | POA: Diagnosis not present

## 2022-12-19 DIAGNOSIS — Z800 Family history of malignant neoplasm of digestive organs: Secondary | ICD-10-CM | POA: Diagnosis not present

## 2022-12-19 DIAGNOSIS — C9110 Chronic lymphocytic leukemia of B-cell type not having achieved remission: Secondary | ICD-10-CM | POA: Diagnosis not present

## 2022-12-19 DIAGNOSIS — M8580 Other specified disorders of bone density and structure, unspecified site: Secondary | ICD-10-CM | POA: Diagnosis not present

## 2022-12-19 DIAGNOSIS — Z806 Family history of leukemia: Secondary | ICD-10-CM | POA: Diagnosis not present

## 2022-12-19 NOTE — Patient Instructions (Signed)
[facility]  Discharge Instructions  You were seen and examined today by Dr. Ellin Saba.  Dr. Ellin Saba discussed your most recent lab work which revealed that everything looks good and stable. Your calcium was just slightly decreased just keep taking the Calcium like you are doing.  Follow-up as scheduled in 6 months.    Thank you for choosing [facility] to provide your oncology and hematology care.   To afford each patient quality time with our provider, please arrive at least 15 minutes before your scheduled appointment time. You may need to reschedule your appointment if you arrive late (10 or more minutes). Arriving late affects you and other patients whose appointments are after yours.  Also, if you miss three or more appointments without notifying the office, you may be dismissed from the clinic at the provider's discretion.    Again, thank you for choosing [facility].  Our hope is that these requests will decrease the amount of time that you wait before being seen by our physicians.   If you have a lab appointment with the Cancer Center - please note that after April 8th, all labs will be drawn in the cancer center.  You do not have to check in or register with the main entrance as you have in the past but will complete your check-in at the cancer center.            _____________________________________________________________  Should you have questions after your visit to [facility], please contact our office at [phone] and follow the prompts.  Our office hours are 8:00 a.m. to 4:30 p.m. Monday - Thursday and 8:00 a.m. to 2:30 p.m. Friday.  Please note that voicemails left after 4:00 p.m. may not be returned until the following business day.  We are closed weekends and all major holidays.  You do have access to a nurse 24-7, just call the main number to the clinic [phone] and do not press  any options, hold on the line and a nurse will answer the phone.    For prescription refill requests, have your pharmacy contact our office and allow 72 hours.    Masks are no longer required in the cancer centers. If you would like for your care team to wear a mask while they are taking care of you, please let them know. You may have one support person who is at least 76 years old accompany you for your appointments.

## 2022-12-22 ENCOUNTER — Other Ambulatory Visit: Payer: Self-pay | Admitting: *Deleted

## 2022-12-22 DIAGNOSIS — Z79899 Other long term (current) drug therapy: Secondary | ICD-10-CM

## 2022-12-23 LAB — CBC WITH DIFFERENTIAL/PLATELET
Absolute Monocytes: 485 cells/uL (ref 200–950)
Basophils Absolute: 58 cells/uL (ref 0–200)
Basophils Relative: 0.6 %
Eosinophils Absolute: 204 cells/uL (ref 15–500)
Eosinophils Relative: 2.1 %
HCT: 39.9 % (ref 35.0–45.0)
Hemoglobin: 12.8 g/dL (ref 11.7–15.5)
Lymphs Abs: 4540 cells/uL — ABNORMAL HIGH (ref 850–3900)
MCH: 28.3 pg (ref 27.0–33.0)
MCHC: 32.1 g/dL (ref 32.0–36.0)
MCV: 88.3 fL (ref 80.0–100.0)
MPV: 10.1 fL (ref 7.5–12.5)
Monocytes Relative: 5 %
Neutro Abs: 4414 cells/uL (ref 1500–7800)
Neutrophils Relative %: 45.5 %
Platelets: 249 10*3/uL (ref 140–400)
RBC: 4.52 10*6/uL (ref 3.80–5.10)
RDW: 12.1 % (ref 11.0–15.0)
Total Lymphocyte: 46.8 %
WBC: 9.7 10*3/uL (ref 3.8–10.8)

## 2022-12-23 LAB — COMPLETE METABOLIC PANEL WITH GFR
AG Ratio: 1.9 (calc) (ref 1.0–2.5)
ALT: 7 U/L (ref 6–29)
AST: 14 U/L (ref 10–35)
Albumin: 4.2 g/dL (ref 3.6–5.1)
Alkaline phosphatase (APISO): 87 U/L (ref 37–153)
BUN/Creatinine Ratio: 16 (calc) (ref 6–22)
BUN: 16 mg/dL (ref 7–25)
CO2: 33 mmol/L — ABNORMAL HIGH (ref 20–32)
Calcium: 9.2 mg/dL (ref 8.6–10.4)
Chloride: 103 mmol/L (ref 98–110)
Creat: 1.02 mg/dL — ABNORMAL HIGH (ref 0.60–1.00)
Globulin: 2.2 g/dL (calc) (ref 1.9–3.7)
Glucose, Bld: 101 mg/dL — ABNORMAL HIGH (ref 65–99)
Potassium: 3.6 mmol/L (ref 3.5–5.3)
Sodium: 142 mmol/L (ref 135–146)
Total Bilirubin: 0.4 mg/dL (ref 0.2–1.2)
Total Protein: 6.4 g/dL (ref 6.1–8.1)
eGFR: 57 mL/min/{1.73_m2} — ABNORMAL LOW (ref >=60)

## 2022-12-25 NOTE — Progress Notes (Signed)
Creatinine is borderline elevated-1.02 and GFR is low-57.---improved. Rest of CMP WNL.  Avoid NSAID use.   Absolute lymphocytes are elevated. Rest of CBC WNL.

## 2022-12-26 ENCOUNTER — Telehealth: Payer: Self-pay | Admitting: *Deleted

## 2022-12-26 NOTE — Telephone Encounter (Signed)
Patient contacted the office and stating she received a call from our office to review her lab results earlier this week. Patient states she was advised not to take any NSAIDS. Patient states she is scheduled to have an MRI on Saturday. Patient states she fell earlier this year and she broke her shoulder and hurt her arm. Patient states they are now working on figuring out what needs to be done with her leg. Patient states the provider who ordered the MRI gave her a small prescription of Meloxicam and advised her if she needs it she can take it prior to the MRI. Patient states she is aware this is a NSAID and she wanted to see if it was okay to take a dose of this or if she should just stick to Tylenol. Please advise.

## 2022-12-26 NOTE — Telephone Encounter (Signed)
Creatinine was very borderline elevated-1.02.  Ok to take dose of meloxicam prior to MRI. Tylenol would also be safe.

## 2022-12-26 NOTE — Telephone Encounter (Signed)
Patient advised Creatinine was very borderline elevated-1.02. Ok to take dose of meloxicam prior to MRI. Tylenol would also be safe.

## 2022-12-30 DIAGNOSIS — M25571 Pain in right ankle and joints of right foot: Secondary | ICD-10-CM | POA: Diagnosis not present

## 2023-01-01 ENCOUNTER — Encounter: Payer: Self-pay | Admitting: Physician Assistant

## 2023-01-01 ENCOUNTER — Ambulatory Visit: Payer: Medicare HMO | Attending: Physician Assistant | Admitting: Physician Assistant

## 2023-01-01 VITALS — BP 139/66 | HR 76 | Resp 16 | Ht 68.5 in | Wt 240.2 lb

## 2023-01-01 DIAGNOSIS — M19041 Primary osteoarthritis, right hand: Secondary | ICD-10-CM | POA: Diagnosis not present

## 2023-01-01 DIAGNOSIS — L920 Granuloma annulare: Secondary | ICD-10-CM

## 2023-01-01 DIAGNOSIS — R7989 Other specified abnormal findings of blood chemistry: Secondary | ICD-10-CM | POA: Diagnosis not present

## 2023-01-01 DIAGNOSIS — M25571 Pain in right ankle and joints of right foot: Secondary | ICD-10-CM | POA: Diagnosis not present

## 2023-01-01 DIAGNOSIS — M7122 Synovial cyst of popliteal space [Baker], left knee: Secondary | ICD-10-CM | POA: Diagnosis not present

## 2023-01-01 DIAGNOSIS — M19042 Primary osteoarthritis, left hand: Secondary | ICD-10-CM

## 2023-01-01 DIAGNOSIS — M7121 Synovial cyst of popliteal space [Baker], right knee: Secondary | ICD-10-CM

## 2023-01-01 DIAGNOSIS — M810 Age-related osteoporosis without current pathological fracture: Secondary | ICD-10-CM | POA: Diagnosis not present

## 2023-01-01 DIAGNOSIS — C9110 Chronic lymphocytic leukemia of B-cell type not having achieved remission: Secondary | ICD-10-CM

## 2023-01-01 DIAGNOSIS — Z96611 Presence of right artificial shoulder joint: Secondary | ICD-10-CM

## 2023-01-01 DIAGNOSIS — Z8639 Personal history of other endocrine, nutritional and metabolic disease: Secondary | ICD-10-CM

## 2023-01-01 DIAGNOSIS — Z8679 Personal history of other diseases of the circulatory system: Secondary | ICD-10-CM | POA: Diagnosis not present

## 2023-01-01 DIAGNOSIS — M359 Systemic involvement of connective tissue, unspecified: Secondary | ICD-10-CM | POA: Diagnosis not present

## 2023-01-01 DIAGNOSIS — Z79899 Other long term (current) drug therapy: Secondary | ICD-10-CM | POA: Diagnosis not present

## 2023-01-01 DIAGNOSIS — G8929 Other chronic pain: Secondary | ICD-10-CM

## 2023-01-08 DIAGNOSIS — M7661 Achilles tendinitis, right leg: Secondary | ICD-10-CM | POA: Diagnosis not present

## 2023-01-26 DIAGNOSIS — E6609 Other obesity due to excess calories: Secondary | ICD-10-CM | POA: Diagnosis not present

## 2023-01-26 DIAGNOSIS — M069 Rheumatoid arthritis, unspecified: Secondary | ICD-10-CM | POA: Diagnosis not present

## 2023-01-26 DIAGNOSIS — C91Z1 Other lymphoid leukemia, in remission: Secondary | ICD-10-CM | POA: Diagnosis not present

## 2023-01-26 DIAGNOSIS — E039 Hypothyroidism, unspecified: Secondary | ICD-10-CM | POA: Diagnosis not present

## 2023-01-26 DIAGNOSIS — I10 Essential (primary) hypertension: Secondary | ICD-10-CM | POA: Diagnosis not present

## 2023-01-26 DIAGNOSIS — M7661 Achilles tendinitis, right leg: Secondary | ICD-10-CM | POA: Diagnosis not present

## 2023-01-26 DIAGNOSIS — M8000XA Age-related osteoporosis with current pathological fracture, unspecified site, initial encounter for fracture: Secondary | ICD-10-CM | POA: Diagnosis not present

## 2023-01-26 DIAGNOSIS — Z6836 Body mass index (BMI) 36.0-36.9, adult: Secondary | ICD-10-CM | POA: Diagnosis not present

## 2023-01-29 DIAGNOSIS — M7661 Achilles tendinitis, right leg: Secondary | ICD-10-CM | POA: Diagnosis not present

## 2023-02-02 DIAGNOSIS — M7661 Achilles tendinitis, right leg: Secondary | ICD-10-CM | POA: Diagnosis not present

## 2023-02-05 DIAGNOSIS — M7661 Achilles tendinitis, right leg: Secondary | ICD-10-CM | POA: Diagnosis not present

## 2023-02-09 DIAGNOSIS — M7661 Achilles tendinitis, right leg: Secondary | ICD-10-CM | POA: Diagnosis not present

## 2023-02-12 ENCOUNTER — Other Ambulatory Visit: Payer: Self-pay | Admitting: Physician Assistant

## 2023-02-12 DIAGNOSIS — M7661 Achilles tendinitis, right leg: Secondary | ICD-10-CM | POA: Diagnosis not present

## 2023-02-12 NOTE — Telephone Encounter (Signed)
Last Fill: 09/04/2022  Eye exam: 11/14/2022 WNL.   Labs: 12/22/2022 Creatinine is borderline elevated-1.02 and GFR is low-57.---improved. Rest of CMP WNL. Absolute lymphocytes are elevated. Rest of CBC WNL.   Next Visit: 04/13/2023  Last Visit: 01/01/2023  DX: Autoimmune disease   Current Dose per office note 01/01/2023: Plaquenil 200 mg 1 tablet by mouth daily.   Okay to refill Plaquenil?

## 2023-02-15 DIAGNOSIS — Z79899 Other long term (current) drug therapy: Secondary | ICD-10-CM | POA: Diagnosis not present

## 2023-02-20 DIAGNOSIS — M7661 Achilles tendinitis, right leg: Secondary | ICD-10-CM | POA: Diagnosis not present

## 2023-03-19 ENCOUNTER — Ambulatory Visit (HOSPITAL_COMMUNITY)
Admission: RE | Admit: 2023-03-19 | Discharge: 2023-03-19 | Disposition: A | Discharge status: Home / Self Care | Payer: Medicare HMO | Source: Ambulatory Visit | Attending: Physician Assistant | Admitting: Physician Assistant

## 2023-03-19 DIAGNOSIS — M810 Age-related osteoporosis without current pathological fracture: Secondary | ICD-10-CM | POA: Diagnosis not present

## 2023-03-19 DIAGNOSIS — M7661 Achilles tendinitis, right leg: Secondary | ICD-10-CM | POA: Diagnosis not present

## 2023-03-19 DIAGNOSIS — M25571 Pain in right ankle and joints of right foot: Secondary | ICD-10-CM | POA: Diagnosis not present

## 2023-03-19 NOTE — Progress Notes (Signed)
DEXA consistent with osteoporosis.  Results and treatment options will be discussed at upcoming follow up visit on 04/13/23

## 2023-04-02 NOTE — Progress Notes (Deleted)
Office Visit Note  Patient: Jacqueline Casey             Date of Birth: 09/23/1945           MRN: [id number]             PCP: Avis Epley, PA-C Referring: Avis Epley, [place]* Visit Date: 04/13/2023 Occupation: @GUAROCC @  Subjective:  No chief complaint on file.   History of Present Illness: Jacqueline Casey is a 77 y.o. female ***     Activities of Daily Living:  Patient reports morning stiffness for *** {minute/hour:19697}.   Patient {ACTIONS;DENIES/REPORTS:21021675::"Denies"} nocturnal pain.  Difficulty dressing/grooming: {ACTIONS;DENIES/REPORTS:21021675::"Denies"} Difficulty climbing stairs: {ACTIONS;DENIES/REPORTS:21021675::"Denies"} Difficulty getting out of chair: {ACTIONS;DENIES/REPORTS:21021675::"Denies"} Difficulty using hands for taps, buttons, cutlery, and/or writing: {ACTIONS;DENIES/REPORTS:21021675::"Denies"}  No Rheumatology ROS completed.   PMFS History:  Patient Active Problem List   Diagnosis Date Noted  . CLL (chronic lymphocytic leukemia) (HCC) 12/29/2019  . Leukocytosis 12/03/2019  . Baker's cyst of knee 10/18/2011  . Knee mass 10/04/2011    Past Medical History:  Diagnosis Date  . Baker's cyst of knee 10/18/2011  . CLL (chronic lymphocytic leukemia) (HCC)    per patient   . Hypertension   . Osteoarthritis   . Thyroid disease     Family History  Problem Relation Age of Onset  . Cancer Other   . Diabetes Other   . Cancer Mother        breast  . COPD Mother   . Leukemia Father   . Osteoporosis Sister   . Asthma Sister   . Dermatomyositis Sister   . COPD Sister   . Kidney disease Sister   . Cancer Son        lymphoma, colon  . Diabetes Son   . COPD Daughter   . Osteoporosis Daughter   . Rheum arthritis Daughter    Past Surgical History:  Procedure Laterality Date  . FOOT SURGERY Right 2017   hammer toe   . HAND SURGERY Left 2014   finger, hand  . REVERSE TOTAL SHOULDER ARTHROPLASTY Right 09/08/2022  . SKIN TAG  REMOVAL  11/07/2018   stomach and below left eye   . THYROID SURGERY    . TUBAL LIGATION    . VAGINAL HYSTERECTOMY     Social History   Social History Narrative  . Not on file    There is no immunization history on file for this patient.   Objective: Vital Signs: There were no vitals taken for this visit.   Physical Exam   Musculoskeletal Exam: ***  CDAI Exam: CDAI Score: -- Patient Global: --; Provider Global: -- Swollen: --; Tender: -- Joint Exam 04/13/2023   No joint exam has been documented for this visit   There is currently no information documented on the homunculus. Go to the Rheumatology activity and complete the homunculus joint exam.  Investigation: No additional findings.  Imaging: DG BONE DENSITY (DXA)  Result Date: 03/19/2023 EXAM: DUAL X-RAY ABSORPTIOMETRY (DXA) FOR BONE MINERAL DENSITY IMPRESSION: Your patient Jacqueline Casey completed a BMD test on 03/19/2023 using the [employer] DXA System (software version: 14.10) manufactured by [employer]. The following summarizes the results of our evaluation. Technologist: KRK PATIENT BIOGRAPHICAL: Name: Jacqueline Casey, Jacqueline Casey Patient ID: [id number] Birth Date: 1946/03/12 Height: 67.0 in. Gender: Female Exam Date: 03/19/2023 Weight: 240.2 lbs. Indications: Bilateral Oophrectomy, Caucasian, Height Loss, History of Fracture (Adult), Low Calcium Intake, Partial Hysterectomy, Post Menopausal, Rheumatoid Arthritis Fractures: Finger, Shoulder, Toe,  Wrist Treatments: Asprin, Calcium, Multivitamin, Synthroid, Vitamin D DENSITOMETRY RESULTS: Site         Region     Measured Date Measured Age WHO Classification Young Adult T-score BMD         %Change vs. Previous Significant Change (*) DualFemur Neck Left 03/19/2024 76.5 Osteoporosis -2.5 0.693 g/cm2 0.6% - DualFemur Neck Left 10/28/2020 74.1 Osteoporosis -2.5 0.689 g/cm2 -4.2% - DualFemur Neck Left 02/07/2018 71.4 Osteopenia -2.3 0.719 g/cm2 - - DualFemur Total Mean  03/19/2023 76.5 Osteopenia -1.7 0.799 g/cm2 0.6% - DualFemur Total Mean 10/28/2020 74.1 Osteopenia -1.7 0.794 g/cm2 -2.3% - DualFemur Total Mean 02/07/2018 71.4 Osteopenia -1.5 0.813 g/cm2 - - Left Forearm Radius 33% 03/19/2023 76.5 Osteopenia -1.1 0.638 g/cm2 - - ASSESSMENT: The BMD measured at Femur Neck Left is 0.693 g/cm2 with a T-score of -2.5. This patient is considered osteoporotic according to World Health Organization [facility]) criteria. The scan quality is good. Lumbar spine was excluded due to advanced degenerative changes. Compared with the prior study on 10/28/2020, the BMD of the total mean shows no statistically significant change. World Science writer [facility]) criteria for post-menopausal, Caucasian Women: Normal:       T-score at or above -1 SD Osteopenia:   T-score between -1 and -2.5 SD Osteoporosis: T-score at or below -2.5 SD RECOMMENDATIONS: 1. All patients should optimize calcium and vitamin D intake. 2. Consider FDA-approved medical therapies in postmenopausal women and med aged 78 years and older, based on the following: a. A hip or vertebral (clinical or morphometric) fracture b. T-score< -2.5 at the femoral neck or spine after appropriate evaluation to exclude secondary causes c. Low bone mass (T-score between -1.0 and -2.5 at the femoral neck or spine) and a 10-year probability of a hip fracture > 3% or a 10-year probability of a major osteoporosis-related fracture > 20% based on the [place]-adapted WHO algorithm d. Clinician judgment and/or patient preferences may indicate treatment for people with 10-year fracture probabilities above or below these levels FOLLOW-UP: Patients with diagnosis of osteoporsis or at high risk for fracture should have regular bone mineral density tests. For patients eligible for Medicare, routine testing is allowed once every 2 years. The testing frequency can be increased to one year for patients who have rapidly progressing disease, those who are receiving or  discontinuing medical therapy to restore bone mass, or have additional risk factors. I have reviewed this report, and agree with the above findings. [facility] Electronically Signed   By: Romona Curls M.D.   On: 03/19/2023 13:16    Recent Labs: Lab Results  Component Value Date   WBC 9.7 12/22/2022   HGB 12.8 12/22/2022   PLT 249 12/22/2022   NA 142 12/22/2022   K 3.6 12/22/2022   CL 103 12/22/2022   CO2 33 (H) 12/22/2022   GLUCOSE 101 (H) 12/22/2022   BUN 16 12/22/2022   CREATININE 1.02 (H) 12/22/2022   BILITOT 0.4 12/22/2022   ALKPHOS 80 12/08/2022   AST 14 12/22/2022   ALT 7 12/22/2022   PROT 6.4 12/22/2022   ALBUMIN 3.7 12/08/2022   CALCIUM 9.2 12/22/2022   GFRAA 45 (L) 03/29/2020   QFTBGOLDPLUS NEGATIVE 04/07/2022    Speciality Comments: PLQ eye exam: 11/14/2022 WNL. [facility]. Follow up in 6 months.  Procedures:  No procedures performed Allergies: Sulfa antibiotics   Assessment / Plan:     Visit Diagnoses: No diagnosis found.  Orders: No orders of the defined types were placed in this encounter.  No orders of the defined types were placed in this encounter.   Face-to-face time spent with patient was *** minutes. Greater than 50% of time was spent in counseling and coordination of care.  Follow-Up Instructions: No follow-ups on file.   Ellen Henri, CMA  Note - This record has been created using Animal nutritionist.  Chart creation errors have been sought, but may not always  have been located. Such creation errors do not reflect on  the standard of medical care.

## 2023-04-06 ENCOUNTER — Ambulatory Visit: Payer: Medicare HMO | Admitting: Physician Assistant

## 2023-04-09 DIAGNOSIS — U071 COVID-19: Secondary | ICD-10-CM | POA: Diagnosis not present

## 2023-04-09 DIAGNOSIS — Z6835 Body mass index (BMI) 35.0-35.9, adult: Secondary | ICD-10-CM | POA: Diagnosis not present

## 2023-04-09 DIAGNOSIS — Z20828 Contact with and (suspected) exposure to other viral communicable diseases: Secondary | ICD-10-CM | POA: Diagnosis not present

## 2023-04-09 DIAGNOSIS — E6609 Other obesity due to excess calories: Secondary | ICD-10-CM | POA: Diagnosis not present

## 2023-04-13 ENCOUNTER — Ambulatory Visit: Payer: Medicare HMO | Admitting: Physician Assistant

## 2023-04-13 DIAGNOSIS — M19042 Primary osteoarthritis, left hand: Secondary | ICD-10-CM

## 2023-04-13 DIAGNOSIS — Z8639 Personal history of other endocrine, nutritional and metabolic disease: Secondary | ICD-10-CM

## 2023-04-13 DIAGNOSIS — R7989 Other specified abnormal findings of blood chemistry: Secondary | ICD-10-CM

## 2023-04-13 DIAGNOSIS — G8929 Other chronic pain: Secondary | ICD-10-CM

## 2023-04-13 DIAGNOSIS — M359 Systemic involvement of connective tissue, unspecified: Secondary | ICD-10-CM

## 2023-04-13 DIAGNOSIS — Z8679 Personal history of other diseases of the circulatory system: Secondary | ICD-10-CM

## 2023-04-13 DIAGNOSIS — M7121 Synovial cyst of popliteal space [Baker], right knee: Secondary | ICD-10-CM

## 2023-04-13 DIAGNOSIS — L920 Granuloma annulare: Secondary | ICD-10-CM

## 2023-04-13 DIAGNOSIS — M810 Age-related osteoporosis without current pathological fracture: Secondary | ICD-10-CM

## 2023-04-13 DIAGNOSIS — Z79899 Other long term (current) drug therapy: Secondary | ICD-10-CM

## 2023-04-13 DIAGNOSIS — M7122 Synovial cyst of popliteal space [Baker], left knee: Secondary | ICD-10-CM

## 2023-04-13 DIAGNOSIS — C9110 Chronic lymphocytic leukemia of B-cell type not having achieved remission: Secondary | ICD-10-CM

## 2023-04-13 DIAGNOSIS — Z96611 Presence of right artificial shoulder joint: Secondary | ICD-10-CM

## 2023-05-15 ENCOUNTER — Other Ambulatory Visit: Payer: Self-pay | Admitting: Physician Assistant

## 2023-05-15 NOTE — Telephone Encounter (Signed)
Last Fill: 02/12/2023  Eye exam: 11/14/2022 WNL.    Labs: 12/22/2022 Creatinine is borderline elevated-1.02 and GFR is low-57.---improved. Rest of CMP WNL. Absolute lymphocytes are elevated. Rest of CBC WNL.   Next Visit: 05/28/2023  Last Visit: 01/01/2023  DX:Autoimmune disease   Current Dose per office note 01/01/2023: Plaquenil 200 mg 1 tablet by mouth daily   Okay to refill Plaquenil?

## 2023-05-15 NOTE — Progress Notes (Signed)
Office Visit Note  Patient: Jacqueline Casey             Date of Birth: [birth year]           MRN: [id number]             PCP: Avis Epley, PA-C Referring: Avis Epley, [place]* Visit Date: 05/28/2023 Occupation: @GUAROCC @  Subjective:  Discuss DEXA results and treatment options   History of Present Illness: Jacqueline Casey is a 77 y.o. female with history of autoimmune disease and osteoarthritis.  Patient is currently taking Plaquenil 200 mg 1 tablet by mouth daily.  She is tolerating Plaquenil without any side effects.  She has noted significant clinical benefit while taking Plaquenil.  She is having less joint pain and joint inflammation.  She denies any new concerns since her last office visit. Patient had an updated bone density on 03/19/2023 but has not yet heard about the results.  She has been taking a calcium and vitamin D supplement. She is apprehensive to start a prescription for osteoporosis.    Activities of Daily Living:  Patient reports morning stiffness for 10-15 minutes.   Patient Denies nocturnal pain.  Difficulty dressing/grooming: Reports Difficulty climbing stairs: Reports Difficulty getting out of chair: Reports Difficulty using hands for taps, buttons, cutlery, and/or writing: Denies  Review of Systems  Constitutional:  Positive for fatigue.  HENT:  Positive for mouth dryness. Negative for mouth sores.   Eyes:  Negative for dryness.  Respiratory:  Positive for cough and shortness of breath.   Cardiovascular:  Negative for chest pain and palpitations.  Gastrointestinal:  Negative for blood in stool, constipation and diarrhea.  Endocrine: Negative for increased urination.  Genitourinary:  Positive for nocturia. Negative for involuntary urination.  Musculoskeletal:  Positive for joint pain, gait problem, joint pain, joint swelling, muscle weakness and morning stiffness. Negative for myalgias, muscle tenderness and myalgias.  Skin:  Positive for  hair loss and sensitivity to sunlight. Negative for color change and rash.  Allergic/Immunologic: Negative for susceptible to infections.  Neurological:  Positive for tremors. Negative for dizziness and headaches.  Hematological:  Negative for swollen glands.  Psychiatric/Behavioral:  Positive for sleep disturbance. Negative for depressed mood. The patient is not nervous/anxious.     PMFS History:  Patient Active Problem List   Diagnosis Date Noted   CLL (chronic lymphocytic leukemia) (HCC) 12/29/2019   Leukocytosis 12/03/2019   Baker's cyst of knee 10/18/2011   Knee mass 10/04/2011    Past Medical History:  Diagnosis Date   Baker's cyst of knee 10/18/2011   CLL (chronic lymphocytic leukemia) (HCC)    per patient    Hypertension    Osteoarthritis    Thyroid disease     Family History  Problem Relation Age of Onset   Cancer Other    Diabetes Other    Cancer Mother        breast   COPD Mother    Leukemia Father    Osteoporosis Sister    Asthma Sister    Dermatomyositis Sister    COPD Sister    Kidney disease Sister    Cancer Son        lymphoma, colon   Diabetes Son    COPD Daughter    Osteoporosis Daughter    Rheum arthritis Daughter    Past Surgical History:  Procedure Laterality Date   FOOT SURGERY Right 2017   hammer toe    HAND SURGERY Left 2014  finger, hand   REVERSE TOTAL SHOULDER ARTHROPLASTY Right 09/08/2022   SKIN TAG REMOVAL  11/07/2018   stomach and below left eye    THYROID SURGERY     TUBAL LIGATION     VAGINAL HYSTERECTOMY     Social History   Social History Narrative   Not on file    There is no immunization history on file for this patient.   Objective: Vital Signs: BP (!) 160/92 (BP Location: Left Wrist, Patient Position: Sitting, Cuff Size: Normal)   Pulse 77   Resp 16   Ht 5' 6.5" (1.689 m)   Wt 236 lb (107 kg)   BMI 37.52 kg/m    Physical Exam Vitals and nursing note reviewed.  Constitutional:      Appearance: She is  well-developed.  HENT:     Head: Normocephalic and atraumatic.  Eyes:     Conjunctiva/sclera: Conjunctivae normal.  Cardiovascular:     Rate and Rhythm: Normal rate and regular rhythm.     Heart sounds: Normal heart sounds.  Pulmonary:     Effort: Pulmonary effort is normal.     Breath sounds: Normal breath sounds.  Abdominal:     General: Bowel sounds are normal.     Palpations: Abdomen is soft.  Musculoskeletal:     Cervical back: Normal range of motion.  Lymphadenopathy:     Cervical: No cervical adenopathy.  Skin:    General: Skin is warm and dry.     Capillary Refill: Capillary refill takes less than 2 seconds.  Neurological:     Mental Status: She is alert and oriented to person, place, and time.  Psychiatric:        Behavior: Behavior normal.      Musculoskeletal Exam: C-spine has limited ROM with lateral rotation.  Thoracic kyphosis.  Right shoulder replacement has limited ROM.  Left shoulder has good ROM with some stiffness.  Elbow joints, wrist joints, MCPs, PIPs, and DIPs good ROM with no synovitis.  PIP and DIP thickening consistent with osteoarthritis of both hands.  CMC joint prominence bilaterally.  Both knee joints have good ROM with no warmth or effusion today.  Tenderness and thickening at the base of the right achilles tendon.   CDAI Exam: CDAI Score: -- Patient Global: --; Provider Global: -- Swollen: --; Tender: -- Joint Exam 05/28/2023   No joint exam has been documented for this visit   There is currently no information documented on the homunculus. Go to the Rheumatology activity and complete the homunculus joint exam.  Investigation: No additional findings.  Imaging: No results found.  Recent Labs: Lab Results  Component Value Date   WBC 9.7 12/22/2022   HGB 12.8 12/22/2022   PLT 249 12/22/2022   NA 142 12/22/2022   K 3.6 12/22/2022   CL 103 12/22/2022   CO2 33 (H) 12/22/2022   GLUCOSE 101 (H) 12/22/2022   BUN 16 12/22/2022    CREATININE 1.02 (H) 12/22/2022   BILITOT 0.4 12/22/2022   ALKPHOS 80 12/08/2022   AST 14 12/22/2022   ALT 7 12/22/2022   PROT 6.4 12/22/2022   ALBUMIN 3.7 12/08/2022   CALCIUM 9.2 12/22/2022   GFRAA 45 (L) 03/29/2020   QFTBGOLDPLUS NEGATIVE 04/07/2022    Speciality Comments: PLQ eye exam: 11/14/2022 WNL. [facility]. Follow up in 6 months.  Procedures:  No procedures performed Allergies: Sulfa antibiotics      Assessment / Plan:     Visit Diagnoses: Autoimmune disease (HCC) - ANA  1:160 NH, dsDNA+, inflammatory arthritis. X-rays obtained in 2019 showed MCP narrowing consistent with inflammatory arthritis: She has not had any signs or symptoms of a flare since her last office visit.  She has not had any new concerns.  She continues to find Plaquenil to be beneficial at alleviating her joint pain and inflammation.  She has no synovitis on examination today.  She is currently taking Plaquenil 200 mg 1 tablet by mouth daily and is tolerating without any side effects.  She has not had any recent rashes, oral or nasal ulcerations, Raynaud's phenomenon, or increased sicca symptoms. Lab work from 09/01/2022 was reviewed today in the office: ANA 1: 80 NH, CRP within normal limits, anti-CCP negative, rheumatoid factor negative, ESR within normal limits, complements within normal limits, and dsDNA negative.  The following lab work will be obtained today for further evaluation.  She was advised to notify us if she develops signs or symptoms of a flare.  She will follow-up in the office in 5 months or sooner if needed. - Plan: CBC with Differential/Platelet, COMPLETE METABOLIC PANEL WITH GFR, Protein / creatinine ratio, urine, Anti-DNA antibody, double-stranded, C3 and C4, Sedimentation rate, VITAMIN D 25 Hydroxy (Vit-D Deficiency, Fractures)  High risk medication use - Plaquenil 200 mg 1 tablet by mouth daily.  CBC and CMP updated on 12/22/22.  Orders for CBC and CMP released today.  PLQ eye  exam: 11/14/2022 WNL. [facility]  - Plan: CBC with Differential/Platelet, COMPLETE METABOLIC PANEL WITH GFR  LFTs abnormal - LFTs WNL on 12/22/22. CMP updated today. Plan: COMPLETE METABOLIC PANEL WITH GFR  Primary osteoarthritis of both hands: CMC, PIP, and DIP thickening consistent with osteoarthritis of both hands.  No synovitis noted.   S/P reverse total shoulder arthroplasty, right - Fall on 09/04/2022.  Underwent surgery performed by Dr. Rennis Chris. Doing well.    Chronic pain of right ankle - Under care of Dr. Charlsie Merles.  She had a cortisone injection on 08/30/22.  According to the patient the right achilles tendon ruptured during the fall in [place] will not be undergoing surgical intervention.  She is using a cane to assist with ambulation.   Age-related osteoporosis without current pathological fracture: DEXA on 10/28/20: The BMD measured at Femur Neck Left is 0.697 g/cm2 with a T-score of -2.5. Patient has declined treatment in the past. She is taking a calcium and vitamin D supplement. Humerus fracture in January 2024.  DEXA updated 03/19/23: The BMD measured at Femur Neck Left is 0.693 g/cm2 with a T-score of -2.5--results were discussed today in detail.   Patient has declined starting treatment for management of osteoporosis.  She was given an informational handout to review about Prolia but has declined starting any treatment for osteoporosis at this time.   She plans on using a cane or walker to assist with ambulation.    Vitamin D deficiency - Vitamin D will be checked today. Plan: VITAMIN D 25 Hydroxy (Vit-D Deficiency, Fractures)  Other medical conditions are listed as follows:   CLL (chronic lymphocytic leukemia) (HCC) - Under the care of Dr. Drucilla Chalet office visit note from 12/19/22-no indication for treatment at this time.  History of hypothyroidism  History of hypertension: BP was elevated today in the office-BP was rechecked today prior to leaving.  Advised  patient to reach out to PCP if her BP remains elevated.   Granuloma annulare  Orders: Orders Placed This Encounter  Procedures   CBC with Differential/Platelet   COMPLETE METABOLIC PANEL WITH  GFR   Protein / creatinine ratio, urine   Anti-DNA antibody, double-stranded   C3 and C4   Sedimentation rate   VITAMIN D 25 Hydroxy (Vit-D Deficiency, Fractures)   No orders of the defined types were placed in this encounter.    Follow-Up Instructions: Return in about 5 months (around 10/26/2023) for Autoimmune Disease, Osteoarthritis.   Gearldine Bienenstock, PA-C  Note - This record has been created using Dragon software.  Chart creation errors have been sought, but may not always  have been located. Such creation errors do not reflect on  the standard of medical care.

## 2023-05-28 ENCOUNTER — Encounter: Payer: Self-pay | Admitting: Physician Assistant

## 2023-05-28 ENCOUNTER — Ambulatory Visit: Payer: Medicare HMO | Attending: Physician Assistant | Admitting: Physician Assistant

## 2023-05-28 VITALS — BP 160/92 | HR 77 | Resp 16 | Ht 66.5 in | Wt 236.0 lb

## 2023-05-28 DIAGNOSIS — M359 Systemic involvement of connective tissue, unspecified: Secondary | ICD-10-CM | POA: Diagnosis not present

## 2023-05-28 DIAGNOSIS — M810 Age-related osteoporosis without current pathological fracture: Secondary | ICD-10-CM

## 2023-05-28 DIAGNOSIS — M19041 Primary osteoarthritis, right hand: Secondary | ICD-10-CM

## 2023-05-28 DIAGNOSIS — C9110 Chronic lymphocytic leukemia of B-cell type not having achieved remission: Secondary | ICD-10-CM | POA: Diagnosis not present

## 2023-05-28 DIAGNOSIS — M7122 Synovial cyst of popliteal space [Baker], left knee: Secondary | ICD-10-CM | POA: Diagnosis not present

## 2023-05-28 DIAGNOSIS — M7121 Synovial cyst of popliteal space [Baker], right knee: Secondary | ICD-10-CM | POA: Diagnosis not present

## 2023-05-28 DIAGNOSIS — Z8639 Personal history of other endocrine, nutritional and metabolic disease: Secondary | ICD-10-CM

## 2023-05-28 DIAGNOSIS — M25571 Pain in right ankle and joints of right foot: Secondary | ICD-10-CM | POA: Diagnosis not present

## 2023-05-28 DIAGNOSIS — Z79899 Other long term (current) drug therapy: Secondary | ICD-10-CM | POA: Diagnosis not present

## 2023-05-28 DIAGNOSIS — G8929 Other chronic pain: Secondary | ICD-10-CM

## 2023-05-28 DIAGNOSIS — E559 Vitamin D deficiency, unspecified: Secondary | ICD-10-CM | POA: Diagnosis not present

## 2023-05-28 DIAGNOSIS — L920 Granuloma annulare: Secondary | ICD-10-CM

## 2023-05-28 DIAGNOSIS — R7989 Other specified abnormal findings of blood chemistry: Secondary | ICD-10-CM

## 2023-05-28 DIAGNOSIS — Z8679 Personal history of other diseases of the circulatory system: Secondary | ICD-10-CM

## 2023-05-28 DIAGNOSIS — Z96611 Presence of right artificial shoulder joint: Secondary | ICD-10-CM | POA: Diagnosis not present

## 2023-05-28 DIAGNOSIS — M19042 Primary osteoarthritis, left hand: Secondary | ICD-10-CM

## 2023-05-29 LAB — COMPLETE METABOLIC PANEL WITH GFR
AG Ratio: 2.3 (calc) (ref 1.0–2.5)
ALT: 12 U/L (ref 6–29)
AST: 18 U/L (ref 10–35)
Albumin: 4.5 g/dL (ref 3.6–5.1)
Alkaline phosphatase (APISO): 85 U/L (ref 37–153)
BUN: 11 mg/dL (ref 7–25)
CO2: 29 mmol/L (ref 20–32)
Calcium: 9.6 mg/dL (ref 8.6–10.4)
Chloride: 101 mmol/L (ref 98–110)
Creat: 0.95 mg/dL (ref 0.60–1.00)
Globulin: 2 g/dL (ref 1.9–3.7)
Glucose, Bld: 96 mg/dL (ref 65–99)
Potassium: 3.9 mmol/L (ref 3.5–5.3)
Sodium: 141 mmol/L (ref 135–146)
Total Bilirubin: 0.6 mg/dL (ref 0.2–1.2)
Total Protein: 6.5 g/dL (ref 6.1–8.1)
eGFR: 62 mL/min/{1.73_m2} (ref >=60)

## 2023-05-29 LAB — CBC WITH DIFFERENTIAL/PLATELET
Absolute Monocytes: 611 {cells}/uL (ref 200–950)
Basophils Absolute: 47 {cells}/uL (ref 0–200)
Basophils Relative: 0.5 %
Eosinophils Absolute: 197 {cells}/uL (ref 15–500)
Eosinophils Relative: 2.1 %
HCT: 39.2 % (ref 35.0–45.0)
Hemoglobin: 12.8 g/dL (ref 11.7–15.5)
Lymphs Abs: 4061 {cells}/uL — ABNORMAL HIGH (ref 850–3900)
MCH: 28.5 pg (ref 27.0–33.0)
MCHC: 32.7 g/dL (ref 32.0–36.0)
MCV: 87.3 fL (ref 80.0–100.0)
MPV: 10.3 fL (ref 7.5–12.5)
Monocytes Relative: 6.5 %
Neutro Abs: 4484 {cells}/uL (ref 1500–7800)
Neutrophils Relative %: 47.7 %
Platelets: 237 10*3/uL (ref 140–400)
RBC: 4.49 10*6/uL (ref 3.80–5.10)
RDW: 13.3 % (ref 11.0–15.0)
Total Lymphocyte: 43.2 %
WBC: 9.4 10*3/uL (ref 3.8–10.8)

## 2023-05-29 LAB — SEDIMENTATION RATE: Sed Rate: 6 mm/h (ref 0–30)

## 2023-05-29 LAB — C3 AND C4
C3 Complement: 141 mg/dL (ref 83–193)
C4 Complement: 27 mg/dL (ref 15–57)

## 2023-05-29 LAB — PROTEIN / CREATININE RATIO, URINE
Creatinine, Urine: 94 mg/dL (ref 20–275)
Protein/Creat Ratio: 96 mg/g{creat} (ref 24–184)
Protein/Creatinine Ratio: 0.096 mg/mg{creat} (ref 0.024–0.184)
Total Protein, Urine: 9 mg/dL (ref 5–24)

## 2023-05-29 LAB — ANTI-DNA ANTIBODY, DOUBLE-STRANDED: ds DNA Ab: 9 [IU]/mL — ABNORMAL HIGH

## 2023-05-29 LAB — VITAMIN D 25 HYDROXY (VIT D DEFICIENCY, FRACTURES): Vit D, 25-Hydroxy: 53 ng/mL (ref 30–100)

## 2023-05-29 NOTE — Progress Notes (Signed)
Absolute lymphocytes are elevated but trending down. Rest of CBC WNL.  Patient has known history of CLL.   CMP WNL ESR WNL Vitamin D WNL Protein creatinine ratio WNL

## 2023-05-30 NOTE — Progress Notes (Signed)
dsDNA is within the indeterminate range-9-trending down.  Complements WNL

## 2023-06-04 ENCOUNTER — Encounter (HOSPITAL_COMMUNITY): Payer: Self-pay

## 2023-06-04 ENCOUNTER — Emergency Department (HOSPITAL_COMMUNITY): Payer: Medicare HMO

## 2023-06-04 ENCOUNTER — Other Ambulatory Visit: Payer: Self-pay

## 2023-06-04 ENCOUNTER — Emergency Department (HOSPITAL_COMMUNITY)
Admission: EM | Admit: 2023-06-04 | Discharge: 2023-06-04 | Disposition: A | Discharge status: Home / Self Care | Payer: Medicare HMO | Attending: Emergency Medicine | Admitting: Emergency Medicine

## 2023-06-04 DIAGNOSIS — Z7982 Long term (current) use of aspirin: Secondary | ICD-10-CM | POA: Diagnosis not present

## 2023-06-04 DIAGNOSIS — Z96611 Presence of right artificial shoulder joint: Secondary | ICD-10-CM | POA: Diagnosis not present

## 2023-06-04 DIAGNOSIS — R079 Chest pain, unspecified: Secondary | ICD-10-CM | POA: Diagnosis not present

## 2023-06-04 DIAGNOSIS — R072 Precordial pain: Secondary | ICD-10-CM

## 2023-06-04 DIAGNOSIS — Z79899 Other long term (current) drug therapy: Secondary | ICD-10-CM | POA: Diagnosis not present

## 2023-06-04 DIAGNOSIS — I10 Essential (primary) hypertension: Secondary | ICD-10-CM | POA: Insufficient documentation

## 2023-06-04 DIAGNOSIS — J189 Pneumonia, unspecified organism: Secondary | ICD-10-CM

## 2023-06-04 DIAGNOSIS — R918 Other nonspecific abnormal finding of lung field: Secondary | ICD-10-CM | POA: Diagnosis not present

## 2023-06-04 DIAGNOSIS — J181 Lobar pneumonia, unspecified organism: Secondary | ICD-10-CM | POA: Diagnosis not present

## 2023-06-04 LAB — BASIC METABOLIC PANEL
Anion gap: 11 (ref 5–15)
BUN: 19 mg/dL (ref 8–23)
CO2: 26 mmol/L (ref 22–32)
Calcium: 9.5 mg/dL (ref 8.9–10.3)
Chloride: 98 mmol/L (ref 98–111)
Creatinine, Ser: 0.94 mg/dL (ref 0.44–1.00)
GFR, Estimated: >60 mL/min (ref >60)
Glucose, Bld: 95 mg/dL (ref 70–99)
Potassium: 3.7 mmol/L (ref 3.5–5.1)
Sodium: 135 mmol/L (ref 135–145)

## 2023-06-04 LAB — CBC
HCT: 41.9 % (ref 36.0–46.0)
Hemoglobin: 13 g/dL (ref 12.0–15.0)
MCH: 27.8 pg (ref 26.0–34.0)
MCHC: 31 g/dL (ref 30.0–36.0)
MCV: 89.7 fL (ref 80.0–100.0)
Platelets: 221 10*3/uL (ref 150–400)
RBC: 4.67 MIL/uL (ref 3.87–5.11)
RDW: 13.8 % (ref 11.5–15.5)
WBC: 13.4 10*3/uL — ABNORMAL HIGH (ref 4.0–10.5)
nRBC: 0 % (ref 0.0–0.2)

## 2023-06-04 LAB — TROPONIN I (HIGH SENSITIVITY)
Troponin I (High Sensitivity): 15 ng/L (ref <18)
Troponin I (High Sensitivity): 16 ng/L (ref <18)

## 2023-06-04 MED ORDER — DOXYCYCLINE HYCLATE 100 MG PO CAPS
100.0000 mg | ORAL_CAPSULE | Freq: Two times a day (BID) | ORAL | 0 refills | Status: DC
Start: 2023-06-04 — End: 2023-07-03

## 2023-06-04 MED ORDER — OMEPRAZOLE 20 MG PO CPDR: 20.0000 mg | DELAYED_RELEASE_CAPSULE | Freq: Every day | ORAL | 0 refills | Status: DC

## 2023-06-04 NOTE — ED Provider Triage Note (Signed)
Emergency Medicine Provider Triage Evaluation Note  Jacqueline Casey , a 77 y.o. female  was evaluated in triage.  Pt complains of sudden onset of pain to the center of her chest.  Pain began 15 to 30 minutes after eating breakfast.  States she ate sausage and eggs with grits, began having deep aching pain to the center of her chest that was nonradiating.  Had 1 episode of vomiting that appeared to be mucus with streaks of blood.  Pain improved after vomiting.  No shortness of breath.  Was seen by PCP and sent here for further evaluation.  Had 3 baby aspirin's and 1 nitroglycerin at PCPs office.  Pain resolved.  Denies any chest pain at present.  No history of cardiac disease.  Review of Systems  Positive: Chest pain Negative: Shortness of breath  Physical Exam  BP (!) 178/77 (BP Location: Right Arm)   Pulse 82   Temp 97.7 F (36.5 C) (Oral)   Resp 18   Ht 5\' 6"  (1.676 m)   Wt 106.6 kg   SpO2 97%   BMI 37.93 kg/m  Gen:   Awake, no distress   Resp:  Normal effort  MSK:   Moves extremities without difficulty  Other:    Medical Decision Making  Medically screening exam initiated at 1:39 PM.  Appropriate orders placed.  Jacqueline Casey was informed that the remainder of the evaluation will be completed by another provider, this initial triage assessment does not replace that evaluation, and the importance of remaining in the ED until their evaluation is complete.     Pauline Aus, PA-C 06/04/23 1436

## 2023-06-04 NOTE — Discharge Instructions (Signed)
As we discussed, your work-up in the ER was reassuring for acute findings. Laboratory evaluation and x-ray imaging did not reveal any emergent causes of your symptoms. Given that you did have some concerns about blood in your vomit, I have given you a prescription for prilosec which is an acid reducing medication which you should take as prescribed every day 30 minutes prior to the largest meal of the day.   Additionally, your x-ray did show some concerns for pneumonia, and given your cough I recommend that we treat this. I have given you a prescription for doxycyline which you need to fill and take as prescribed in its entirety every day for management of this.  I recommend that you call your primary doctor to schedule follow-up appointment to have a repeat x-ray in the next week or so  Additionally, given that you did have chest pain and you have never had any sort of cardiac evaluation I have given you a referral to cardiology.  They should call you in the next 72 hours to schedule a follow-up appointment.  If they do not, you will need to reach out yourself.  Return if development of any new or worsening symptoms.

## 2023-06-04 NOTE — ED Notes (Signed)
Here from PCP. Sent for CP. Resolved after Aspirin and NTG. Denies pain, sob, nausea or dizziness at this time. Pending lab and xray results. EKG reviewed.

## 2023-06-04 NOTE — ED Notes (Signed)
Pt alert, NAD, calm, interactive, resps e/u, speaking in clear complete sentences. Denies pain, sob, nausea or dizziness.

## 2023-06-04 NOTE — ED Provider Notes (Signed)
[place] [facility]ERGENCY DEPARTMENT AT [facility] Provider Note   CSN: [id number] Arrival date & time: 06/04/23  1258     History  Chief Complaint  Patient presents with   Chest Pain    Jacqueline Casey is a 77 y.o. female.  Patient with history of CLL not currently undergoing any treatment presents today with complaints of chest pain. She states that same began about 15-30 minutes after eating breakfast this morning. She states that the pain was sudden onset and felt like a 'knot in my chest.' She states that the pain was severe and then she felt as though she needed to vomit. She was able to vomit and felt significant improvement in her pain. She does note that she saw what appeared to be streaks of blood in her vomit, however she did have a tomato with her breakfast. She then went to her pcp and was given 324 ASA and SL nitroglycerin. She was then sent here for evaluation. She states that before she left her pcp office she was completely symptom free. She denies any shortness of breath. She denies any additional episodes of nausea or vomiting. No hematochezia or melena. No overuse of NSAIDs or alcohol abuse. She does note that she has been coughing for a little over a week, denies any change in this today. No fevers or chills. She does not smoke. Denies any cardiac history.   The history is provided by the patient. No language interpreter was used.  Chest Pain      Home Medications Prior to Admission medications   Medication Sig Start Date End Date Taking? Authorizing Provider  Acetaminophen (TYLENOL PO) Take 1 tablet by mouth as needed (pain).   Yes [provider]  ASPIRIN 81 PO Take 1 tablet by mouth every other day.   Yes [provider]  b complex vitamins capsule Take 1 capsule by mouth daily.   Yes [provider]  CALCIUM PO Take 1 capsule by mouth daily. 1000 mg   Yes [provider]  cetirizine (ZYRTEC) 10 MG tablet Take 10 mg  by mouth daily.   Yes [provider]  hydrochlorothiazide (HYDRODIURIL) 12.5 MG tablet Take 12.5 mg by mouth daily. 10/27/19  Yes [provider]  hydroxychloroquine (PLAQUENIL) 200 MG tablet Take 1 tablet by mouth once daily 05/15/23  Yes Gearldine Bienenstock, PA-C  ketoconazole (NIZORAL) 2 % cream Apply topically 2 (two) times daily as needed. 02/23/22  Yes [provider]  levothyroxine (SYNTHROID, LEVOTHROID) 88 MCG tablet Take 88 mcg by mouth daily.   Yes [provider]  TART CHERRY PO Take 1 capsule by mouth daily.   Yes [provider]  TURMERIC CURCUMIN PO Take 1 capsule by mouth daily.   Yes [provider]  vitamin B-12 (CYANOCOBALAMIN) 500 MCG tablet Take 500 mcg by mouth daily.   Yes [provider]  VITAMIN D PO Take 1 capsule by mouth daily.   Yes [provider]      Allergies    Citrus and Sulfa antibiotics    Review of Systems   Review of Systems  Cardiovascular:  Positive for chest pain.  All other systems reviewed and are negative.   Physical Exam Updated Vital Signs BP (!) 141/87   Pulse 68   Temp 97.7 F (36.5 C) (Oral)   Resp 14   Ht 5\' 6"  (1.676 m)   Wt 106.6 kg   SpO2 96%   BMI 37.93 kg/m  Physical Exam Vitals and nursing note reviewed.  Constitutional:      General: She is not in acute distress.    Appearance: Normal appearance. She is normal weight. She is not ill-appearing, toxic-appearing or diaphoretic.  HENT:     Head: Normocephalic and atraumatic.  Cardiovascular:     Rate and Rhythm: Normal rate and regular rhythm.     Pulses:          Dorsalis pedis pulses are 2+ on the right side and 2+ on the left side.       Posterior tibial pulses are 2+ on the right side and 2+ on the left side.     Heart sounds: Normal heart sounds.  Pulmonary:     Effort: Pulmonary effort is normal. No respiratory distress.     Breath sounds: Normal breath sounds.  Chest:     Chest wall: No  tenderness.  Abdominal:     Palpations: Abdomen is soft.     Tenderness: There is no abdominal tenderness.  Musculoskeletal:        General: Normal range of motion.     Cervical back: Normal range of motion.     Right lower leg: No tenderness. No edema.     Left lower leg: No tenderness. No edema.  Skin:    General: Skin is warm and dry.  Neurological:     General: No focal deficit present.     Mental Status: She is alert.  Psychiatric:        Mood and Affect: Mood normal.        Behavior: Behavior normal.     ED Results / Procedures / Treatments   Labs (all labs ordered are listed, but only abnormal results are displayed) Labs Reviewed  CBC - Abnormal; Notable for the following components:      Result Value   WBC 13.4 (*)    All other components within normal limits  BASIC METABOLIC PANEL  TROPONIN I (HIGH SENSITIVITY)  TROPONIN I (HIGH SENSITIVITY)    EKG EKG Interpretation Date/Time:  Monday June 04 2023 13:10:02 EDT Ventricular Rate:  77 PR Interval:  182 QRS Duration:  94 QT Interval:  390 QTC Calculation: 441 R Axis:   -6  Text Interpretation: Sinus tachycardia with Blocked Premature atrial complexes Nonspecific ST and T wave abnormality Abnormal ECG When compared with ECG of 04-Sep-2022 11:55, Premature ventricular complexes are no longer Present Premature atrial complexes are now Present Nonspecific T wave abnormality, improved in Inferior leads T wave inversion no longer evident in Lateral leads Confirmed by Tanda Rockers ([id number]) on 06/04/2023 1:55:30 PM  Radiology DG Chest 2 View  Result Date: 06/04/2023 CLINICAL DATA:  Chest pain EXAM: CHEST - 2 VIEW COMPARISON:  02/07/2005 FINDINGS: Normal cardiopericardial silhouette. No pneumothorax or effusion. Interstitial changes are now seen. There is some more focal opacity as well in the lingula. Infiltrates possible. No pneumothorax or effusion. Right shoulder arthroplasty seen at the edge of the imaging field.  Degenerative changes IMPRESSION: Subtle lingular opacity. Possible infiltrate. Also interstitial changes. Recommend follow-up. Electronically Signed   By: Karen Kays M.D.   On: 06/04/2023 16:06    Procedures Procedures    Medications Ordered in ED Medications - No data to display  ED Course/ Medical Decision Making/ A&P                                 Medical Decision Making  Amount and/or Complexity of Data Reviewed Labs: ordered. Radiology: ordered.   This patient is a 76 y.o. female who presents to the ED for concern of chest pain, this involves an extensive number of treatment options, and is a complaint that carries with it a high risk of complications and morbidity. The emergent differential diagnosis prior to evaluation includes, but is not limited to,  ACS, pericarditis, myocarditis, aortic dissection, PE, pneumothorax, esophageal spasm or rupture, chronic angina, pneumonia, bronchitis, GERD, reflux/PUD, biliary disease, pancreatitis, costochondritis, anxiety   This is not an exhaustive differential.   Past Medical History / Co-morbidities / Social History:  has a past medical history of Baker's cyst of knee (10/18/2011), CLL (chronic lymphocytic leukemia) (HCC), Hypertension, Osteoarthritis, and Thyroid disease.  Additional history: Chart reviewed. Pertinent results include: no previous cardiac evaluation seen in chart review  Physical Exam: Physical exam performed. The pertinent findings include: no acute physical exam abnormalities. Patient symptom free on my evaluation  Lab Tests: I ordered, and personally interpreted labs.  The pertinent results include:  WBC 13.4, delta troponin negative and flat.  No other acute laboratory abnormalities.   Imaging Studies: I ordered imaging studies including CXR. I independently visualized and interpreted imaging which showed   Subtle lingular opacity. Possible infiltrate. Also interstitial changes. Recommend follow-up.  I  agree with the radiologist interpretation.   Cardiac Monitoring:  The patient was maintained on a cardiac monitor.  My attending physician Dr. Wallace Cullens viewed and interpreted the cardiac monitored which showed an underlying rhythm of: no STEMI. I agree with this interpretation  Disposition: After consideration of the diagnostic results and the patients response to treatment, I feel that emergency department workup does not suggest an emergent condition requiring admission or immediate intervention beyond what has been performed at this time. The plan is: discharge with close outpatient follow-up and return precautions. Patients has continued to be asymptomatic throughout her visit today and feels ready to go home.  Her labs are overall benign. HEART score is 3, low risk with story unlikely to be cardiac in nature given pain relief with vomiting. She does have some leukocytosis that could be explained by her CLL, however also could be related to the concerns for pneumonia seen on her chest x-ray.  Patient does note she has been coughing for the last few weeks. Therefore we will treat with doxycycline.  CURB 65 score is 1, patient without significant respiratory symptoms. No indication for admission at this time. She did note that she had 1 episode of emesis earlier today and was concerned for hematemesis, however she did eat tomatoes immediately before. No persistent pain or emesis to test here today. Will cover with prilosec. Additionally, given patients age and symptoms, will give referral to cardiology for follow-up given she has never had any sort of cardiac evaluation previously. Evaluation and diagnostic testing in the emergency department does not suggest an emergent condition requiring admission or immediate intervention beyond what has been performed at this time.  Plan for discharge with close PCP follow-up.  Patient is understanding and amenable with plan, educated on red flag symptoms that would prompt  immediate return.  Patient discharged in stable condition.  I discussed this case with my attending physician Dr. Wallace Cullens who cosigned this note including patient's presenting symptoms, physical exam, and planned diagnostics and interventions. Attending physician stated agreement with plan or made changes to plan which were implemented.    Final Clinical Impression(s) / ED Diagnoses Final diagnoses:  Precordial chest pain  Lingular pneumonia    Rx / DC Orders ED Discharge Orders          Ordered    omeprazole (PRILOSEC) 20 MG capsule  Daily        06/04/23 1817    doxycycline (VIBRAMYCIN) 100 MG capsule  2 times daily        06/04/23 1817    Ambulatory referral to Cardiology       Comments: If you have not heard from the Cardiology office within the next 72 hours please call [phone].   06/04/23 1818          An After Visit Summary was printed and given to the patient.     Vear Clock 06/04/23 1841    Vanetta Mulders, MD 06/08/23 [phone]

## 2023-06-04 NOTE — ED Triage Notes (Signed)
Complains of chest pain Feels like "a big knot in the center of chest" Pt complains of nausea and vomited blood x1 today.   Went to PCP PCP gave 3, 81mg  ASA and 1 NTG Pain level 10/10, NTG lowered pain to 3/10

## 2023-06-08 DIAGNOSIS — Z6836 Body mass index (BMI) 36.0-36.9, adult: Secondary | ICD-10-CM | POA: Diagnosis not present

## 2023-06-08 DIAGNOSIS — R0789 Other chest pain: Secondary | ICD-10-CM | POA: Diagnosis not present

## 2023-06-08 DIAGNOSIS — E6609 Other obesity due to excess calories: Secondary | ICD-10-CM | POA: Diagnosis not present

## 2023-06-08 DIAGNOSIS — J189 Pneumonia, unspecified organism: Secondary | ICD-10-CM | POA: Diagnosis not present

## 2023-06-26 ENCOUNTER — Inpatient Hospital Stay: Payer: Medicare HMO | Attending: Hematology

## 2023-06-26 ENCOUNTER — Ambulatory Visit (HOSPITAL_COMMUNITY)
Admission: RE | Admit: 2023-06-26 | Discharge: 2023-06-26 | Disposition: A | Discharge status: Home / Self Care | Payer: Medicare HMO | Source: Ambulatory Visit | Attending: Family Medicine | Admitting: Family Medicine

## 2023-06-26 ENCOUNTER — Other Ambulatory Visit (HOSPITAL_COMMUNITY): Payer: Self-pay | Admitting: Family Medicine

## 2023-06-26 DIAGNOSIS — Z807 Family history of other malignant neoplasms of lymphoid, hematopoietic and related tissues: Secondary | ICD-10-CM | POA: Diagnosis not present

## 2023-06-26 DIAGNOSIS — Z7982 Long term (current) use of aspirin: Secondary | ICD-10-CM | POA: Insufficient documentation

## 2023-06-26 DIAGNOSIS — E538 Deficiency of other specified B group vitamins: Secondary | ICD-10-CM | POA: Insufficient documentation

## 2023-06-26 DIAGNOSIS — J189 Pneumonia, unspecified organism: Secondary | ICD-10-CM | POA: Insufficient documentation

## 2023-06-26 DIAGNOSIS — Z803 Family history of malignant neoplasm of breast: Secondary | ICD-10-CM | POA: Diagnosis not present

## 2023-06-26 DIAGNOSIS — Z806 Family history of leukemia: Secondary | ICD-10-CM | POA: Insufficient documentation

## 2023-06-26 DIAGNOSIS — C9110 Chronic lymphocytic leukemia of B-cell type not having achieved remission: Secondary | ICD-10-CM | POA: Diagnosis not present

## 2023-06-26 DIAGNOSIS — I700 Atherosclerosis of aorta: Secondary | ICD-10-CM | POA: Diagnosis not present

## 2023-06-26 DIAGNOSIS — Z87891 Personal history of nicotine dependence: Secondary | ICD-10-CM | POA: Insufficient documentation

## 2023-06-26 DIAGNOSIS — M069 Rheumatoid arthritis, unspecified: Secondary | ICD-10-CM | POA: Diagnosis not present

## 2023-06-26 DIAGNOSIS — M8580 Other specified disorders of bone density and structure, unspecified site: Secondary | ICD-10-CM | POA: Diagnosis not present

## 2023-06-26 DIAGNOSIS — Z800 Family history of malignant neoplasm of digestive organs: Secondary | ICD-10-CM | POA: Diagnosis not present

## 2023-06-26 LAB — COMPREHENSIVE METABOLIC PANEL
ALT: 16 U/L (ref 0–44)
AST: 20 U/L (ref 15–41)
Albumin: 3.9 g/dL (ref 3.5–5.0)
Alkaline Phosphatase: 70 U/L (ref 38–126)
Anion gap: 9 (ref 5–15)
BUN: 18 mg/dL (ref 8–23)
CO2: 29 mmol/L (ref 22–32)
Calcium: 9.4 mg/dL (ref 8.9–10.3)
Chloride: 97 mmol/L — ABNORMAL LOW (ref 98–111)
Creatinine, Ser: 1.08 mg/dL — ABNORMAL HIGH (ref 0.44–1.00)
GFR, Estimated: 53 mL/min — ABNORMAL LOW (ref >60)
Glucose, Bld: 100 mg/dL — ABNORMAL HIGH (ref 70–99)
Potassium: 3.7 mmol/L (ref 3.5–5.1)
Sodium: 135 mmol/L (ref 135–145)
Total Bilirubin: 0.5 mg/dL (ref <1.2)
Total Protein: 6.4 g/dL — ABNORMAL LOW (ref 6.5–8.1)

## 2023-06-26 LAB — CBC WITH DIFFERENTIAL/PLATELET
Abs Immature Granulocytes: 0.04 10*3/uL (ref 0.00–0.07)
Basophils Absolute: 0 10*3/uL (ref 0.0–0.1)
Basophils Relative: 0 %
Eosinophils Absolute: 0.2 10*3/uL (ref 0.0–0.5)
Eosinophils Relative: 2 %
HCT: 37.4 % (ref 36.0–46.0)
Hemoglobin: 12.1 g/dL (ref 12.0–15.0)
Immature Granulocytes: 0 %
Lymphocytes Relative: 46 %
Lymphs Abs: 4.5 10*3/uL — ABNORMAL HIGH (ref 0.7–4.0)
MCH: 28.9 pg (ref 26.0–34.0)
MCHC: 32.4 g/dL (ref 30.0–36.0)
MCV: 89.3 fL (ref 80.0–100.0)
Monocytes Absolute: 0.7 10*3/uL (ref 0.1–1.0)
Monocytes Relative: 7 %
Neutro Abs: 4.3 10*3/uL (ref 1.7–7.7)
Neutrophils Relative %: 45 %
Platelets: 202 10*3/uL (ref 150–400)
RBC: 4.19 MIL/uL (ref 3.87–5.11)
RDW: 13.6 % (ref 11.5–15.5)
WBC: 9.7 10*3/uL (ref 4.0–10.5)
nRBC: 0 % (ref 0.0–0.2)

## 2023-06-26 LAB — LACTATE DEHYDROGENASE: LDH: 158 U/L (ref 98–192)

## 2023-07-03 ENCOUNTER — Inpatient Hospital Stay: Payer: Medicare HMO | Admitting: Hematology

## 2023-07-03 VITALS — BP 154/84 | HR 70 | Temp 98.1°F | Resp 189

## 2023-07-03 DIAGNOSIS — C9110 Chronic lymphocytic leukemia of B-cell type not having achieved remission: Secondary | ICD-10-CM | POA: Diagnosis not present

## 2023-07-03 DIAGNOSIS — Z87891 Personal history of nicotine dependence: Secondary | ICD-10-CM | POA: Diagnosis not present

## 2023-07-03 DIAGNOSIS — M069 Rheumatoid arthritis, unspecified: Secondary | ICD-10-CM | POA: Diagnosis not present

## 2023-07-03 DIAGNOSIS — Z800 Family history of malignant neoplasm of digestive organs: Secondary | ICD-10-CM | POA: Diagnosis not present

## 2023-07-03 DIAGNOSIS — Z7982 Long term (current) use of aspirin: Secondary | ICD-10-CM | POA: Diagnosis not present

## 2023-07-03 DIAGNOSIS — Z807 Family history of other malignant neoplasms of lymphoid, hematopoietic and related tissues: Secondary | ICD-10-CM | POA: Diagnosis not present

## 2023-07-03 DIAGNOSIS — E538 Deficiency of other specified B group vitamins: Secondary | ICD-10-CM | POA: Diagnosis not present

## 2023-07-03 DIAGNOSIS — Z803 Family history of malignant neoplasm of breast: Secondary | ICD-10-CM | POA: Diagnosis not present

## 2023-07-03 DIAGNOSIS — Z806 Family history of leukemia: Secondary | ICD-10-CM | POA: Diagnosis not present

## 2023-07-03 DIAGNOSIS — M8580 Other specified disorders of bone density and structure, unspecified site: Secondary | ICD-10-CM | POA: Diagnosis not present

## 2023-07-03 NOTE — Progress Notes (Signed)
[facility] [address] [place] [address]    Clinic Day:  07/03/2023  Referring physician: Avis Epley, PA*  Patient Care Team: Ladon Applebaum as PCP - General (Family Medicine)   ASSESSMENT & PLAN:   Assessment: 1.  Clinical stage 0 CLL: -She was evaluated at the request of Dr. Corliss Skains for elevated white count, predominantly lymphocytes since January 2019. -Denies any B symptoms.  Physical exam does not show any lymphadenopathy or splenomegaly. -We reviewed results of flow cytometry from 12/03/2019.  Monoclonal B-cell population with coexpression of CD5 comprises 36% of lymphocytes.  Overall the morphology and immunophenotype favor CLL.  However there is a small chance of it being atypical mantle cell lymphoma.   2.  Rheumatoid arthritis: -Small joints of the hands involved.  Plaquenil did not help. -She is taking Tylenol and NSAIDs.   3.  Osteopenia: -DEXA scan on 02/07/2018 shows T score -2.3. -Vitamin D level was borderline low at 24.   4.  Vitamin B12 deficiency: -B12 level was low at 177.  She was given B12 injection today.  She was told to start taking B12 1 mg tablet daily.    Plan: 1.  Clinical stage 0 CLL: - She denies any fevers, night sweats or weight loss in the last 6 months.  No infections reported.  She reports easy bruising since Plaquenil was started.  She is also on aspirin 81 mg daily. - Physical exam: No palpable adenopathy in the neck or axillary region. - Reviewed labs from 06/26/2023: LDH was normal.  LFTs were normal.  CBC shows normal hemoglobin and platelet count.  White count is 9.7 with absolute lymphocyte count 4.5. - No indication for treatment at this time.  RTC 6 months for follow-up with repeat labs and exam.   2.  Rheumatoid arthritis: - Plaquenil was started in January 2024 with improvement in her joint swellings.   3.  Osteopenia: - Continue vitamin D supplements.  Last vitamin D was normal.      No orders of the defined types were placed in this encounter.     Alben Deeds Teague,acting as a Neurosurgeon for Jacqueline Massed, MD.,have documented all relevant documentation on the behalf of Jacqueline Massed, MD,as directed by  Jacqueline Massed, MD while in the presence of Jacqueline Massed, MD.  I, Jacqueline Massed MD, have reviewed the above documentation for accuracy and completeness, and I agree with the above.    Jacqueline Massed, MD   11/12/20244:49 PM  CHIEF COMPLAINT:   Diagnosis: CLL    Cancer Staging  No matching staging information was found for the patient.    Prior Therapy: none  Current Therapy:  surveillance   HISTORY OF PRESENT ILLNESS:   Oncology History   No history exists.     INTERVAL HISTORY:   Jacqueline Casey is a 77 y.o. female presenting to clinic today for follow up of CLL. She was last seen by me on 12/19/22.  Since her last visit, she presented to the ED on 06/04/23 for precordial chest pain. I independently viewed and interpreted Chest X ray that found subtle lingular opacity with possible infiltrate. She was prescribed prilosec 20 mg OD and doxycycline 100 mg BID on discharge.   She had a DEXA scan done on 03/19/23 with a T score of -2.5, which is considered osteoporotic.   Today, she states that she is doing well overall. Her appetite level is at 80%. Her energy level is at 0%.  She is accompanied by her son.   She reports bruising easily since she began taking Plaquenil in January 2024 to improve rheumatoid arthritis. The only other medication she takes is OTC vitamins. She notes one episode of chest pain in which she went to the ED with one episode of hemoptysis. She denies any fevers, night sweats, significant weight loss, or infections in the last 6 months. She denies feeling easily full or bloating.   PAST MEDICAL HISTORY:   Past Medical History: Past Medical History:  Diagnosis Date   Baker's cyst of knee 10/18/2011   CLL  (chronic lymphocytic leukemia) (HCC)    per patient    Hypertension    Osteoarthritis    Thyroid disease     Surgical History: Past Surgical History:  Procedure Laterality Date   FOOT SURGERY Right 2017   hammer toe    HAND SURGERY Left 2014   finger, hand   REVERSE TOTAL SHOULDER ARTHROPLASTY Right 09/08/2022   SKIN TAG REMOVAL  11/07/2018   stomach and below left eye    THYROID SURGERY     TUBAL LIGATION     VAGINAL HYSTERECTOMY      Social History: Social History   Socioeconomic History   Marital status: Married    Spouse name: Not on file   Number of children: 3   Years of education: 12   Highest education level: Not on file  Occupational History   Not on file  Tobacco Use   Smoking status: Former    Current packs/day: 0.00    Average packs/day: 1.5 packs/day for 15.0 years (22.5 ttl pk-yrs)    Types: Cigarettes    Start date: 07/1984    Quit date: 07/1999    Years since quitting: 23.9    Passive exposure: Never   Smokeless tobacco: Never  Vaping Use   Vaping status: Never Used  Substance and Sexual Activity   Alcohol use: No   Drug use: No   Sexual activity: Not on file  Other Topics Concern   Not on file  Social History Narrative   Not on file   Social Determinants of Health   Financial Resource Strain: Not on file  Food Insecurity: Not on file  Transportation Needs: No Transportation Needs (10/11/2020)   PRAPARE - Administrator, Civil Service (Medical): No    Lack of Transportation (Non-Medical): No  Physical Activity: Inactive (10/11/2020)   Exercise Vital Sign    Days of Exercise per Week: 0 days    Minutes of Exercise per Session: 0 min  Stress: Not on file  Social Connections: Not on file  Intimate Partner Violence: Not At Risk (10/11/2020)   Humiliation, Afraid, Rape, and Kick questionnaire    Fear of Current or Ex-Partner: No    Emotionally Abused: No    Physically Abused: No    Sexually Abused: No    Family  History: Family History  Problem Relation Age of Onset   Cancer Other    Diabetes Other    Cancer Mother        breast   COPD Mother    Leukemia Father    Osteoporosis Sister    Asthma Sister    Dermatomyositis Sister    COPD Sister    Kidney disease Sister    Cancer Son        lymphoma, colon   Diabetes Son    COPD Daughter    Osteoporosis Daughter    Rheum arthritis Daughter  Current Medications:  Current Outpatient Medications:    Acetaminophen (TYLENOL PO), Take 1 tablet by mouth as needed (pain)., Disp: , Rfl:    ASPIRIN 81 PO, Take 1 tablet by mouth every other day., Disp: , Rfl:    b complex vitamins capsule, Take 1 capsule by mouth daily., Disp: , Rfl:    CALCIUM PO, Take 1 capsule by mouth daily. 1000 mg, Disp: , Rfl:    cetirizine (ZYRTEC) 10 MG tablet, Take 10 mg by mouth daily., Disp: , Rfl:    hydrochlorothiazide (HYDRODIURIL) 12.5 MG tablet, Take 12.5 mg by mouth daily., Disp: , Rfl:    hydroxychloroquine (PLAQUENIL) 200 MG tablet, Take 1 tablet by mouth once daily, Disp: 90 tablet, Rfl: 0   ketoconazole (NIZORAL) 2 % cream, Apply topically 2 (two) times daily as needed., Disp: , Rfl:    levothyroxine (SYNTHROID, LEVOTHROID) 88 MCG tablet, Take 88 mcg by mouth daily., Disp: , Rfl:    TART CHERRY PO, Take 1 capsule by mouth daily., Disp: , Rfl:    TURMERIC CURCUMIN PO, Take 1 capsule by mouth daily., Disp: , Rfl:    vitamin B-12 (CYANOCOBALAMIN) 500 MCG tablet, Take 500 mcg by mouth daily., Disp: , Rfl:    VITAMIN D PO, Take 1 capsule by mouth daily., Disp: , Rfl:    Allergies: Allergies  Allergen Reactions   Citrus Swelling    Makes her lips swell   Sulfa Antibiotics Hives    REVIEW OF SYSTEMS:   Review of Systems  Constitutional:  Negative for chills, fatigue and fever.  HENT:   Negative for lump/mass, mouth sores, nosebleeds, sore throat and trouble swallowing.   Eyes:  Negative for eye problems.  Respiratory:  Negative for cough and shortness  of breath.   Cardiovascular:  Positive for chest pain. Negative for leg swelling and palpitations.  Gastrointestinal:  Negative for abdominal pain, constipation, diarrhea, nausea and vomiting.  Genitourinary:  Negative for bladder incontinence, difficulty urinating, dysuria, frequency, hematuria and nocturia.   Musculoskeletal:  Negative for arthralgias, back pain, flank pain, myalgias and neck pain.  Skin:  Negative for itching and rash.  Neurological:  Negative for dizziness, headaches and numbness.  Hematological:  Bruises/bleeds easily.  Psychiatric/Behavioral:  Positive for sleep disturbance. Negative for depression and suicidal ideas. The patient is not nervous/anxious.   All other systems reviewed and are negative.    VITALS:   Blood pressure (!) 154/84, pulse 70, temperature 98.1 F (36.7 C), resp. rate (!) 189.  Wt Readings from Last 3 Encounters:  06/04/23 235 lb (106.6 kg)  05/28/23 236 lb (107 kg)  01/01/23 240 lb 3.2 oz (109 kg)    There is no height or weight on file to calculate BMI.  Performance status (ECOG): 1 - Symptomatic but completely ambulatory  PHYSICAL EXAM:   Physical Exam Vitals and nursing note reviewed. Exam conducted with a chaperone present.  Constitutional:      Appearance: Normal appearance.  Cardiovascular:     Rate and Rhythm: Normal rate and regular rhythm.     Pulses: Normal pulses.     Heart sounds: Normal heart sounds.  Pulmonary:     Effort: Pulmonary effort is normal.     Breath sounds: Normal breath sounds.  Abdominal:     Palpations: Abdomen is soft. There is no hepatomegaly, splenomegaly or mass.     Tenderness: There is no abdominal tenderness.  Musculoskeletal:     Right lower leg: No edema.     Left  lower leg: No edema.  Lymphadenopathy:     Cervical: No cervical adenopathy.     Right cervical: No superficial, deep or posterior cervical adenopathy.    Left cervical: No superficial, deep or posterior cervical adenopathy.      Upper Body:     Right upper body: No supraclavicular or axillary adenopathy.     Left upper body: No supraclavicular or axillary adenopathy.  Neurological:     General: No focal deficit present.     Mental Status: She is alert and oriented to person, place, and time.  Psychiatric:        Mood and Affect: Mood normal.        Behavior: Behavior normal.     LABS:      Latest Ref Rng & Units 06/26/2023   12:10 PM 06/04/2023    1:45 PM 05/28/2023    1:41 PM  CBC  WBC 4.0 - 10.5 K/uL 9.7  13.4  9.4   Hemoglobin 12.0 - 15.0 g/dL 29.5  28.4  13.2   Hematocrit 36.0 - 46.0 % 37.4  41.9  39.2   Platelets 150 - 400 K/uL 202  221  237       Latest Ref Rng & Units 06/26/2023   12:10 PM 06/04/2023    1:45 PM 05/28/2023    1:41 PM  CMP  Glucose 70 - 99 mg/dL 440  95  96   BUN 8 - 23 mg/dL 18  19  11    Creatinine 0.44 - 1.00 mg/dL 1.02  7.25  3.66   Sodium 135 - 145 mmol/L 135  135  141   Potassium 3.5 - 5.1 mmol/L 3.7  3.7  3.9   Chloride 98 - 111 mmol/L 97  98  101   CO2 22 - 32 mmol/L 29  26  29    Calcium 8.9 - 10.3 mg/dL 9.4  9.5  9.6   Total Protein 6.5 - 8.1 g/dL 6.4   6.5   Total Bilirubin <1.2 mg/dL 0.5   0.6   Alkaline Phos 38 - 126 U/L 70     AST 15 - 41 U/L 20   18   ALT 0 - 44 U/L 16   12      No results found for: "CEA1", "CEA" / No results found for: "CEA1", "CEA" No results found for: "PSA1" No results found for: "YQI347" No results found for: "CAN125"  Lab Results  Component Value Date   ALBUMINELP 4.1 04/07/2022   A1GS 0.3 04/07/2022   A2GS 0.7 04/07/2022   BETS 0.5 04/07/2022   BETA2SER 0.4 04/07/2022   GAMS 0.7 (L) 04/07/2022   SPEI  04/07/2022     Comment:     . Consistent with hypogammaglobulinemia. Serum free light  chains or urine immunofixation should be considered if  plasma cell dyscrasias are a possible clinical  diagnosis. .    No results found for: "TIBC", "FERRITIN", "IRONPCTSAT" Lab Results  Component Value Date   LDH 158 06/26/2023    LDH 157 12/08/2022   LDH 155 06/13/2022     STUDIES:   DG Chest 2 View  Result Date: 06/04/2023 CLINICAL DATA:  Chest pain EXAM: CHEST - 2 VIEW COMPARISON:  02/07/2005 FINDINGS: Normal cardiopericardial silhouette. No pneumothorax or effusion. Interstitial changes are now seen. There is some more focal opacity as well in the lingula. Infiltrates possible. No pneumothorax or effusion. Right shoulder arthroplasty seen at the edge of the imaging field. Degenerative changes IMPRESSION: Subtle lingular  opacity. Possible infiltrate. Also interstitial changes. Recommend follow-up. Electronically Signed   By: Karen Kays M.D.   On: 06/04/2023 16:06

## 2023-07-03 NOTE — Patient Instructions (Signed)
[facility] at [facility] Discharge Instructions   You were seen and examined today by Dr. Ellin Saba.  He reviewed the results of your lab work which are normal/stable.   We will see you back in 6 months. We will repeat lab work at that time.    Return as scheduled.    Thank you for choosing [facility] at [facility] to provide your oncology and hematology care.  To afford each patient quality time with our provider, please arrive at least 15 minutes before your scheduled appointment time.   If you have a lab appointment with the Cancer Center please come in thru the Main Entrance and check in at the main information desk.  You need to re-schedule your appointment should you arrive 10 or more minutes late.  We strive to give you quality time with our providers, and arriving late affects you and other patients whose appointments are after yours.  Also, if you no show three or more times for appointments you may be dismissed from the clinic at the providers discretion.     Again, thank you for choosing [facility].  Our hope is that these requests will decrease the amount of time that you wait before being seen by our physicians.       _____________________________________________________________  Should you have questions after your visit to [facility], please contact our office at [phone] and follow the prompts.  Our office hours are 8:00 a.m. and 4:30 p.m. Monday - Friday.  Please note that voicemails left after 4:00 p.m. may not be returned until the following business day.  We are closed weekends and major holidays.  You do have access to a nurse 24-7, just call the main number to the clinic [phone] and do not press any options, hold on the line and a nurse will answer the phone.    For prescription refill requests, have your pharmacy contact our office and allow 72 hours.    Due to Covid, you will need  to wear a mask upon entering the hospital. If you do not have a mask, a mask will be given to you at the Main Entrance upon arrival. For doctor visits, patients may have 1 support person age 48 or older with them. For treatment visits, patients can not have anyone with them due to social distancing guidelines and our immunocompromised population.

## 2023-08-03 DIAGNOSIS — E039 Hypothyroidism, unspecified: Secondary | ICD-10-CM | POA: Diagnosis not present

## 2023-08-03 DIAGNOSIS — C9111 Chronic lymphocytic leukemia of B-cell type in remission: Secondary | ICD-10-CM | POA: Diagnosis not present

## 2023-08-03 DIAGNOSIS — M069 Rheumatoid arthritis, unspecified: Secondary | ICD-10-CM | POA: Diagnosis not present

## 2023-08-03 DIAGNOSIS — M8000XA Age-related osteoporosis with current pathological fracture, unspecified site, initial encounter for fracture: Secondary | ICD-10-CM | POA: Diagnosis not present

## 2023-08-03 DIAGNOSIS — E6609 Other obesity due to excess calories: Secondary | ICD-10-CM | POA: Diagnosis not present

## 2023-08-03 DIAGNOSIS — I10 Essential (primary) hypertension: Secondary | ICD-10-CM | POA: Diagnosis not present

## 2023-08-03 DIAGNOSIS — Z6836 Body mass index (BMI) 36.0-36.9, adult: Secondary | ICD-10-CM | POA: Diagnosis not present

## 2023-08-06 ENCOUNTER — Encounter: Payer: Self-pay | Admitting: Cardiology

## 2023-08-06 ENCOUNTER — Ambulatory Visit: Payer: Medicare HMO | Attending: Cardiology | Admitting: Cardiology

## 2023-08-06 VITALS — BP 130/78 | HR 80 | Resp 16 | Ht 66.0 in | Wt 240.0 lb

## 2023-08-06 DIAGNOSIS — C9110 Chronic lymphocytic leukemia of B-cell type not having achieved remission: Secondary | ICD-10-CM | POA: Diagnosis not present

## 2023-08-06 DIAGNOSIS — R072 Precordial pain: Secondary | ICD-10-CM

## 2023-08-06 DIAGNOSIS — I10 Essential (primary) hypertension: Secondary | ICD-10-CM | POA: Diagnosis not present

## 2023-08-06 DIAGNOSIS — Z87891 Personal history of nicotine dependence: Secondary | ICD-10-CM

## 2023-08-06 NOTE — Patient Instructions (Signed)
Medication Instructions:  Your physician recommends that you continue on your current medications as directed. Please refer to the Current Medication list given to you today.  *If you need a refill on your cardiac medications before your next appointment, please call your pharmacy*  Lab Work: None ordered today. If you have labs (blood work) drawn today and your tests are completely normal, you will receive your results only by: MyChart Message (if you have MyChart) OR A paper copy in the mail If you have any lab test that is abnormal or we need to change your treatment, we will call you to review the results.  Testing/Procedures: Your physician has requested that you have an echocardiogram. Echocardiography is a painless test that uses sound waves to create images of your heart. It provides your doctor with information about the size and shape of your heart and how well your heart's chambers and valves are working. This procedure takes approximately one hour. There are no restrictions for this procedure. Please do NOT wear cologne, perfume, aftershave, or lotions (deodorant is allowed). Please arrive 15 minutes prior to your appointment time.  Please note: We ask at that you not bring children with you during ultrasound (echo/ vascular) testing. Due to room size and safety concerns, children are not allowed in the ultrasound rooms during exams. Our front office staff cannot provide observation of children in our lobby area while testing is being conducted. An adult accompanying a patient to their appointment will only be allowed in the ultrasound room at the discretion of the ultrasound technician under special circumstances. We apologize for any inconvenience.  -------------------------------------------------------------------------------------  Your physician has requested that you have a coronary calcium score performed. This is not covered by insurance and will be an out-of-pocket cost of  approximately $99.   Follow-Up: At [facility], you and your health needs are our priority.  As part of our continuing mission to provide you with exceptional heart care, we have created designated Provider Care Teams.  These Care Teams include your primary Cardiologist (physician) and Advanced Practice Providers (APPs -  Physician Assistants and Nurse Practitioners) who all work together to provide you with the care you need, when you need it.   Your next appointment:   8 week(s)  The format for your next appointment:   In Person  Provider:   Jari Favre, PA-C, Ronie Spies, PA-C, Robin Searing, NP, Jacolyn Reedy, PA-C, Eligha Bridegroom, NP, Tereso Newcomer, PA-C, or Perlie Gold, PA-C. Then, [employer], DO will plan to see you again in 1 year(s).{

## 2023-08-06 NOTE — Progress Notes (Signed)
Cardiology Office Note:    Date:  08/06/2023  NAME:  Jacqueline Casey    MRN: [id number] DOB:  07-04-1946   PCP:  Ladon Applebaum  Former Cardiology Providers: NA Primary Cardiologist:  Tessa Lerner, DO, [facility] (established care 08/06/2023) Electrophysiologist:  None   Referring MD: Silva Bandy, PA-C  Reason of Consult: Chest pain  Chief Complaint  Patient presents with   Precordial chest pain   New Patient (Initial Visit)    History of Present Illness:    Jacqueline Casey is a 77 y.o. Caucasian female whose past medical history and cardiovascular risk factors includes: Chronic lymphocytic leukemia, hypertension, former smoker, osteoarthritis, thyroid disease. She is being seen today for the evaluation of precordial pain at the request of Smoot, Shawn Route, PA-C.  Patient is referred to the practice by the ED for evaluation of precordial pain.  She went to the emergency room department on 06/04/2023 for chest pain evaluation.  Based on the ER documentation reviewed the chest pain relieved after vomiting and did not have any reoccurrence of discomfort while she was being monitored.  Her heart score was 3, high sensitive troponins were negative x 2, and EKG did not illustrate myocardial injury pattern.    Since the ER visit she has not experienced any reoccurrence of chest pain.  In summary, the episode leading to the ER visit is described as a knot-like sensation in the substernal region, associated with nausea and 1 episode of vomiting, the discomfort has not resurfaced, no pain with exertion and no resolution with resting.  Of note, she does not exercise overexert herself but given the recent injury in January 2024.  She now ambulates with a walker.  Patient is a nondiabetic, no family history of premature CAD.  She recently underwent shoulder replacement in January 2024 did not have any cardiovascular complications.  No family history of premature coronary disease  or sudden cardiac death.   Current Medications: Current Meds  Medication Sig   Acetaminophen (TYLENOL PO) Take 1 tablet by mouth as needed (pain).   ASPIRIN 81 PO Take 1 tablet by mouth every other day.   b complex vitamins capsule Take 1 capsule by mouth daily.   CALCIUM PO Take 1 capsule by mouth daily. 1000 mg   cetirizine (ZYRTEC) 10 MG tablet Take 10 mg by mouth daily.   hydrochlorothiazide (HYDRODIURIL) 12.5 MG tablet Take 12.5 mg by mouth daily.   hydroxychloroquine (PLAQUENIL) 200 MG tablet Take 1 tablet by mouth once daily   ketoconazole (NIZORAL) 2 % cream Apply topically 2 (two) times daily as needed.   levothyroxine (SYNTHROID, LEVOTHROID) 88 MCG tablet Take 88 mcg by mouth daily.   omeprazole (PRILOSEC) 20 MG capsule Take 20 mg by mouth 2 (two) times daily.   TART CHERRY PO Take 1 capsule by mouth daily.   TURMERIC CURCUMIN PO Take 1 capsule by mouth daily.   vitamin B-12 (CYANOCOBALAMIN) 500 MCG tablet Take 500 mcg by mouth daily.   VITAMIN D PO Take 1 capsule by mouth daily.     Allergies:    Citrus and Sulfa antibiotics   Past Medical History: Past Medical History:  Diagnosis Date   Baker's cyst of knee 10/18/2011   CLL (chronic lymphocytic leukemia) (HCC)    per patient    Hypertension    Osteoarthritis    Thyroid disease     Past Surgical History: Past Surgical History:  Procedure Laterality Date   FOOT SURGERY Right  2017   hammer toe    HAND SURGERY Left 2014   finger, hand   REVERSE TOTAL SHOULDER ARTHROPLASTY Right 09/08/2022   SKIN TAG REMOVAL  11/07/2018   stomach and below left eye    THYROID SURGERY     TUBAL LIGATION     VAGINAL HYSTERECTOMY      Social History: Social History   Tobacco Use   Smoking status: Former    Current packs/day: 0.00    Average packs/day: 1.5 packs/day for 15.0 years (22.5 ttl pk-yrs)    Types: Cigarettes    Start date: 07/1984    Quit date: 07/1999    Years since quitting: 24.0    Passive exposure: Never    Smokeless tobacco: Never  Vaping Use   Vaping status: Never Used  Substance Use Topics   Alcohol use: No   Drug use: No    Family History: Family History  Problem Relation Age of Onset   Cancer Other    Diabetes Other    Cancer Mother        breast   COPD Mother    Leukemia Father    Osteoporosis Sister    Asthma Sister    Dermatomyositis Sister    COPD Sister    Kidney disease Sister    Cancer Son        lymphoma, colon   Diabetes Son    COPD Daughter    Osteoporosis Daughter    Rheum arthritis Daughter     ROS:   Review of Systems  Cardiovascular:  Negative for chest pain, claudication, irregular heartbeat, leg swelling, near-syncope, orthopnea, palpitations, paroxysmal nocturnal dyspnea and syncope.  Respiratory:  Negative for shortness of breath.   Hematologic/Lymphatic: Negative for bleeding problem.  Neurological:  Positive for dizziness.    EKGs/Labs/Other Studies Reviewed:   EKG: EKG Interpretation Date/Time:  Monday August 06 2023 09:20:46 EST Ventricular Rate:  80 PR Interval:  168 QRS Duration:  96 QT Interval:  418 QTC Calculation: 482 R Axis:   -26  Text Interpretation: Normal sinus rhythm Nonspecific T wave abnormality When compared with ECG of 04-Jun-2023 13:10, Premature atrial complexes are no longer Present Confirmed by Tessa Lerner [phone]) on 08/06/2023 9:25:51 AM  Echocardiogram: NA  Stress Testing:  NA   Labs:    Latest Ref Rng & Units 06/26/2023   12:10 PM 06/04/2023    1:45 PM 05/28/2023    1:41 PM  CBC  WBC 4.0 - 10.5 K/uL 9.7  13.4  9.4   Hemoglobin 12.0 - 15.0 g/dL 60.4  54.0  98.1   Hematocrit 36.0 - 46.0 % 37.4  41.9  39.2   Platelets 150 - 400 K/uL 202  221  237        Latest Ref Rng & Units 06/26/2023   12:10 PM 06/04/2023    1:45 PM 05/28/2023    1:41 PM  BMP  Glucose 70 - 99 mg/dL 191  95  96   BUN 8 - 23 mg/dL 18  19  11    Creatinine 0.44 - 1.00 mg/dL 4.78  2.95  6.21   BUN/Creat Ratio 6 - 22 (calc)    SEE NOTE:   Sodium 135 - 145 mmol/L 135  135  141   Potassium 3.5 - 5.1 mmol/L 3.7  3.7  3.9   Chloride 98 - 111 mmol/L 97  98  101   CO2 22 - 32 mmol/L 29  26  29    Calcium 8.9 - 10.3  mg/dL 9.4  9.5  9.6       Latest Ref Rng & Units 06/26/2023   12:10 PM 06/04/2023    1:45 PM 05/28/2023    1:41 PM  CMP  Glucose 70 - 99 mg/dL 161  95  96   BUN 8 - 23 mg/dL 18  19  11    Creatinine 0.44 - 1.00 mg/dL 0.96  0.45  4.09   Sodium 135 - 145 mmol/L 135  135  141   Potassium 3.5 - 5.1 mmol/L 3.7  3.7  3.9   Chloride 98 - 111 mmol/L 97  98  101   CO2 22 - 32 mmol/L 29  26  29    Calcium 8.9 - 10.3 mg/dL 9.4  9.5  9.6   Total Protein 6.5 - 8.1 g/dL 6.4   6.5   Total Bilirubin <1.2 mg/dL 0.5   0.6   Alkaline Phos 38 - 126 U/L 70     AST 15 - 41 U/L 20   18   ALT 0 - 44 U/L 16   12     No results found for: "CHOL", "HDL", "LDLCALC", "LDLDIRECT", "TRIG", "CHOLHDL" No results for input(s): "LIPOA" in the last 8760 hours. No components found for: "NTPROBNP" No results for input(s): "PROBNP" in the last 8760 hours. No results for input(s): "TSH" in the last 8760 hours.  Physical Exam:    Today's Vitals   08/06/23 0921  BP: 130/78  Pulse: 80  Resp: 16  SpO2: 97%  Weight: 240 lb (108.9 kg)  Height: 5\' 6"  (1.676 m)   Body mass index is 38.74 kg/m. Wt Readings from Last 3 Encounters:  08/06/23 240 lb (108.9 kg)  06/04/23 235 lb (106.6 kg)  05/28/23 236 lb (107 kg)    Physical Exam  Constitutional: No distress.  hemodynamically stable, walks w/ walker.   Neck: No JVD present.  Cardiovascular: Normal rate, regular rhythm, S1 normal and S2 normal. Exam reveals no gallop, no S3 and no S4.  No murmur heard. Pulmonary/Chest: Effort normal and breath sounds normal. No stridor. She has no wheezes. She has no rales.  Abdominal: Soft. Bowel sounds are normal. She exhibits no distension. There is no abdominal tenderness.  Musculoskeletal:        General: No edema.     Cervical back: Neck  supple.  Neurological: She is alert and oriented to person, place, and time. She has intact cranial nerves (2-12).  Skin: Skin is warm.     Impression & Recommendation(s):  Impression:   ICD-10-CM   1. Precordial pain  R07.2 EKG 12-Lead    ECHOCARDIOGRAM COMPLETE    CT CARDIAC SCORING (SELF PAY ONLY)    2. Benign hypertension  I10     3. Former smoker  Z87.891     4. CLL (chronic lymphocytic leukemia) (HCC)  C91.10        Recommendation(s):  Precordial pain Index event October 2024 -based on symptoms likely noncardiac. No reoccurrence of symptoms. EKG today is non-ischemic  Patient is requested to follow-up with GI to rule out noncardiac causes as a concern for possible heartburn/GERD. From a cardiovascular standpoint we discussed the role of ischemic workup given her risk factors. Shared decision was to proceed with echocardiogram as well as a coronary calcium score.  Based on the results of the coronary calcium score we will decide the role of ischemic workup and medication titration. Last lipid profile from 2023 independently reviewed-LDL 76 mg/dL, triglycerides 88 mg/dL.  Patient  is due for a yearly lipid check with PCP.  Will await results. If her coronary calcium score is greater than 75th percentile for age and/or severe it would be reasonable to proceed forward with stress testing.  Will determine this based on the results of the CAC.  I will have her follow-up with APP in 8 weeks and myself in a year  Benign hypertension Office blood pressures are well-controlled. Continue hydrochlorothiazide 12.5 mg p.o. daily. Reemphasized importance of low-salt diet.  Former smoker Reemphasized importance of complete smoking cessation  CLL (chronic lymphocytic leukemia) (HCC) Follows with hematology  Orders Placed:  Orders Placed This Encounter  Procedures   CT CARDIAC SCORING (SELF PAY ONLY)    Standing Status:   Future    Expiration Date:   08/05/2024    Preferred  imaging location?:   [facility]   EKG 12-Lead   ECHOCARDIOGRAM COMPLETE    Standing Status:   Future    Expected Date:   08/13/2023    Expiration Date:   08/05/2024    Where should this test be performed:   [facility])    Does the patient weigh less than or greater than 250 lbs?:   Patient weighs less than 250 lbs    Perflutren DEFINITY (image enhancing agent) should be administered unless hypersensitivity or allergy exist:   Administer Perflutren    Reason for exam-Echo:   Other-Full Diagnosis List    Full ICD-10/Reason for Exam:   Precordial pain [786.51.ICD-9-CM]    As part of medical decision making results of the ER documentation from October 2024, Labs from June 04, 2023, EKG were reviewed independently at today's visit.   Final Medication List:   No orders of the defined types were placed in this encounter.   There are no discontinued medications.   Current Outpatient Medications:    Acetaminophen (TYLENOL PO), Take 1 tablet by mouth as needed (pain)., Disp: , Rfl:    ASPIRIN 81 PO, Take 1 tablet by mouth every other day., Disp: , Rfl:    b complex vitamins capsule, Take 1 capsule by mouth daily., Disp: , Rfl:    CALCIUM PO, Take 1 capsule by mouth daily. 1000 mg, Disp: , Rfl:    cetirizine (ZYRTEC) 10 MG tablet, Take 10 mg by mouth daily., Disp: , Rfl:    hydrochlorothiazide (HYDRODIURIL) 12.5 MG tablet, Take 12.5 mg by mouth daily., Disp: , Rfl:    hydroxychloroquine (PLAQUENIL) 200 MG tablet, Take 1 tablet by mouth once daily, Disp: 90 tablet, Rfl: 0   ketoconazole (NIZORAL) 2 % cream, Apply topically 2 (two) times daily as needed., Disp: , Rfl:    levothyroxine (SYNTHROID, LEVOTHROID) 88 MCG tablet, Take 88 mcg by mouth daily., Disp: , Rfl:    omeprazole (PRILOSEC) 20 MG capsule, Take 20 mg by mouth 2 (two) times daily., Disp: , Rfl:    TART CHERRY PO, Take 1 capsule by mouth daily., Disp: , Rfl:    TURMERIC CURCUMIN PO, Take 1 capsule  by mouth daily., Disp: , Rfl:    vitamin B-12 (CYANOCOBALAMIN) 500 MCG tablet, Take 500 mcg by mouth daily., Disp: , Rfl:    VITAMIN D PO, Take 1 capsule by mouth daily., Disp: , Rfl:   Consent:   NA  Disposition:   8 weeks w/ APP and 1 year w/ myself.  Patient may be asked to follow-up sooner based on the results of the above-mentioned testing.  Her questions and concerns were addressed  to her satisfaction. She voices understanding of the recommendations provided during this encounter.    Signed, Tessa Lerner, DO, [facility]  [facility]  [address] [place], [place] [address] 08/06/2023 10:51 AM

## 2023-08-10 ENCOUNTER — Other Ambulatory Visit: Payer: Self-pay | Admitting: Physician Assistant

## 2023-08-10 NOTE — Telephone Encounter (Signed)
Last Fill: 05/15/2023  Eye exam: 02/15/2023 WNL.    Labs: 06/26/2023 Chloride 97, Glucose 100, Creat. 1.08, GFR 53, Total Protein 6.4, Lymphs Abs 4.5  Next Visit: 10/26/2023  Last Visit: 05/28/2023  DX:Autoimmune disease   Current Dose per office note 05/28/2023:  Plaquenil 200 mg 1 tablet by mouth daily.   Okay to refill Plaquenil?

## 2023-09-03 ENCOUNTER — Ambulatory Visit (HOSPITAL_COMMUNITY)
Admission: RE | Admit: 2023-09-03 | Discharge: 2023-09-03 | Disposition: A | Discharge status: Home / Self Care | Payer: Medicare HMO | Source: Ambulatory Visit | Attending: Cardiology | Admitting: Cardiology

## 2023-09-03 DIAGNOSIS — R072 Precordial pain: Secondary | ICD-10-CM | POA: Insufficient documentation

## 2023-09-10 ENCOUNTER — Ambulatory Visit (HOSPITAL_COMMUNITY): Payer: Medicare HMO | Attending: Cardiology

## 2023-09-10 DIAGNOSIS — R072 Precordial pain: Secondary | ICD-10-CM | POA: Diagnosis not present

## 2023-09-10 LAB — ECHOCARDIOGRAM COMPLETE
Area-P 1/2: 2.66 cm2
S' Lateral: 3.7 cm

## 2023-09-14 DIAGNOSIS — Z6837 Body mass index (BMI) 37.0-37.9, adult: Secondary | ICD-10-CM | POA: Diagnosis not present

## 2023-09-14 DIAGNOSIS — E039 Hypothyroidism, unspecified: Secondary | ICD-10-CM | POA: Diagnosis not present

## 2023-09-14 DIAGNOSIS — E6609 Other obesity due to excess calories: Secondary | ICD-10-CM | POA: Diagnosis not present

## 2023-09-14 DIAGNOSIS — M069 Rheumatoid arthritis, unspecified: Secondary | ICD-10-CM | POA: Diagnosis not present

## 2023-09-14 DIAGNOSIS — I10 Essential (primary) hypertension: Secondary | ICD-10-CM | POA: Diagnosis not present

## 2023-09-14 DIAGNOSIS — C9111 Chronic lymphocytic leukemia of B-cell type in remission: Secondary | ICD-10-CM | POA: Diagnosis not present

## 2023-09-15 LAB — LAB REPORT - SCANNED: EGFR: 57

## 2023-09-18 NOTE — Progress Notes (Signed)
  Cardiology Office Note:  .   Date:  10/01/2023  ID:  [name], DOB November 25, 1945, MRN [id number] PCP: Earlene Gleason  [facility] Providers Cardiologist:  Olinda Bertrand, DO    History of Present Illness: .   Jacqueline Casey is a 78 y.o. female   Chronic lymphocytic leukemia, hypertension, former smoker, osteoarthritis, thyroid  disease. She saw Dr. Albert Huff for the evaluation of precordial pain 07/2023. Echo was normal and Coronary calcium score 0.  Patient comes in with her husband. No further chest pain. She thinks it was acid reflux and doing better on prilosec. Not exercising since a fall a year ago with multiple injuries. Walks with a walker. Reviewed calcium score and echo in detail with patient.   ROS:    Studies Reviewed: Aaron Aas         Prior CV Studies:   CAC 08/2023 IMPRESSION: 1. Coronary calcium score of 0. This was 1st percentile for age-, race-, and sex-matched controls.   2. Aortic atherosclerosis.   3. Mitral annular calcification.  Echo 08/2023 IMPRESSIONS     1. Left ventricular ejection fraction, by estimation, is 50 to 55%. The  left ventricle has low normal function. The average left ventricular  global longitudinal strain is -17.1 %. The global longitudinal strain is  normal.   2. Right ventricular systolic function is normal. The right ventricular  size is normal.   3. The mitral valve is normal in structure. Mild mitral valve  regurgitation.   4. The aortic valve is normal in structure. Aortic valve regurgitation is  trivial.      Risk Assessment/Calculations:             Physical Exam:   VS:  BP 138/75   Pulse 78   Ht 5' 6.5" (1.689 m)   Wt 245 lb 9.6 oz (111.4 kg)   SpO2 97%   BMI 39.05 kg/m    Wt Readings from Last 3 Encounters:  10/01/23 245 lb 9.6 oz (111.4 kg)  08/06/23 240 lb (108.9 kg)  06/04/23 235 lb (106.6 kg)    GEN: Obese, in no acute distress NECK: No JVD; No carotid bruits CARDIAC:  RRR, no  murmurs, rubs, gallops RESPIRATORY:  Clear to auscultation without rales, wheezing or rhonchi  ABDOMEN: Soft, non-tender, non-distended EXTREMITIES:  No edema; No deformity   ASSESSMENT AND PLAN: .     Chest pain felt to be non-cardiac with CAC score 0 and normal LVEF on echo-improved on prilosec  HTN-controlled on HCTZ  CLL followed by oncology  Former smoker-quit 2000          Dispo: f/u   Signed, Theotis Flake, PA-C

## 2023-10-01 ENCOUNTER — Encounter: Payer: Self-pay | Admitting: Physician Assistant

## 2023-10-01 ENCOUNTER — Ambulatory Visit: Payer: HMO | Attending: Physician Assistant | Admitting: Physician Assistant

## 2023-10-01 VITALS — BP 138/75 | HR 78 | Ht 66.5 in | Wt 245.6 lb

## 2023-10-01 DIAGNOSIS — C9110 Chronic lymphocytic leukemia of B-cell type not having achieved remission: Secondary | ICD-10-CM

## 2023-10-01 DIAGNOSIS — R079 Chest pain, unspecified: Secondary | ICD-10-CM

## 2023-10-01 DIAGNOSIS — Z87891 Personal history of nicotine dependence: Secondary | ICD-10-CM

## 2023-10-01 DIAGNOSIS — I10 Essential (primary) hypertension: Secondary | ICD-10-CM

## 2023-10-01 NOTE — Patient Instructions (Signed)
 Medication Instructions:  Your physician recommends that you continue on your current medications as directed. Please refer to the Current Medication list given to you today.   *If you need a refill on your cardiac medications before your next appointment, please call your pharmacy*   Lab Work: None ordered  If you have labs (blood work) drawn today and your tests are completely normal, you will receive your results only by: MyChart Message (if you have MyChart) OR A paper copy in the mail If you have any lab test that is abnormal or we need to change your treatment, we will call you to review the results.   Testing/Procedures: None ordered   Follow-Up: At [facility], you and your health needs are our priority.  As part of our continuing mission to provide you with exceptional heart care, we have created designated Provider Care Teams.  These Care Teams include your primary Cardiologist (physician) and Advanced Practice Providers (APPs -  Physician Assistants and Nurse Practitioners) who all work together to provide you with the care you need, when you need it.  We recommend signing up for the patient portal called "MyChart".  Sign up information is provided on this After Visit Summary.  MyChart is used to connect with patients for Virtual Visits (Telemedicine).  Patients are able to view lab/test results, encounter notes, upcoming appointments, etc.  Non-urgent messages can be sent to your provider as well.   To learn more about what you can do with MyChart, go to [email or web address].    Your next appointment:   1 year(s)  Provider:   Olinda Bertrand, DO     Other Instructions   1st Floor: - Lobby - Registration  - Pharmacy  - Lab - Cafe  2nd Floor: - PV Lab - Diagnostic Testing (echo, CT, nuclear med)  3rd Floor: - Vacant  4th Floor: - TCTS (cardiothoracic surgery) - AFib Clinic - Structural Heart Clinic - Vascular Surgery  - Vascular  Ultrasound  5th Floor: - HeartCare Cardiology (general and EP) - Clinical Pharmacy for coumadin, hypertension, lipid, weight-loss medications, and med management appointments    Valet parking services will be available as well.

## 2023-10-08 DIAGNOSIS — M9901 Segmental and somatic dysfunction of cervical region: Secondary | ICD-10-CM | POA: Diagnosis not present

## 2023-10-08 DIAGNOSIS — M531 Cervicobrachial syndrome: Secondary | ICD-10-CM | POA: Diagnosis not present

## 2023-10-08 DIAGNOSIS — M9902 Segmental and somatic dysfunction of thoracic region: Secondary | ICD-10-CM | POA: Diagnosis not present

## 2023-10-08 DIAGNOSIS — M546 Pain in thoracic spine: Secondary | ICD-10-CM | POA: Diagnosis not present

## 2023-10-08 DIAGNOSIS — M47812 Spondylosis without myelopathy or radiculopathy, cervical region: Secondary | ICD-10-CM | POA: Diagnosis not present

## 2023-10-12 DIAGNOSIS — M546 Pain in thoracic spine: Secondary | ICD-10-CM | POA: Diagnosis not present

## 2023-10-12 DIAGNOSIS — M9901 Segmental and somatic dysfunction of cervical region: Secondary | ICD-10-CM | POA: Diagnosis not present

## 2023-10-12 DIAGNOSIS — M47812 Spondylosis without myelopathy or radiculopathy, cervical region: Secondary | ICD-10-CM | POA: Diagnosis not present

## 2023-10-12 DIAGNOSIS — M531 Cervicobrachial syndrome: Secondary | ICD-10-CM | POA: Diagnosis not present

## 2023-10-12 DIAGNOSIS — M9902 Segmental and somatic dysfunction of thoracic region: Secondary | ICD-10-CM | POA: Diagnosis not present

## 2023-10-12 NOTE — Progress Notes (Signed)
 Office Visit Note  Patient: Jacqueline Casey             Date of Birth: 03-Jul-1946           MRN: [id number]             PCP: Avis Epley, PA-C Referring: Avis Epley, [place]* Visit Date: 10/26/2023 Occupation: @GUAROCC @  Subjective:  Medication monitoring   History of Present Illness: Jacqueline Casey is a 78 y.o. female with history of autoimmune disease and osteoarthritis.  Patient remains on plaquenil 200 mg 1 tablet by mouth daily.  She has been tolerating Plaquenil without any side effects.  She denies any signs or symptoms of a flare recently.  Patient states she has been more sedentary than usual and has some difficulty sleeping at night.  She continues to experience generalized stiffness as well as muscle cramping at night.  Patient is hoping to return to the pool for exercise.  She has been using a walker to assist with ambulation as recommended by her podiatrist due to a torn right Achilles tendon-not planning surgical intervention. She denies any recent rashes or Raynaud's phenomenon.  She has chronic sicca symptoms especially mouth dryness.      Activities of Daily Living:  Patient reports joint stiffness all day  Patient Reports nocturnal pain.  Difficulty dressing/grooming: Reports Difficulty climbing stairs: Reports Difficulty getting out of chair: Reports Difficulty using hands for taps, buttons, cutlery, and/or writing: Reports  Review of Systems  Constitutional:  Positive for fatigue.  HENT:  Positive for mouth sores and mouth dryness.   Eyes:  Positive for dryness.  Respiratory:  Negative for shortness of breath.   Cardiovascular:  Negative for chest pain and palpitations.  Gastrointestinal:  Negative for blood in stool, constipation and diarrhea.  Endocrine: Negative for increased urination.  Genitourinary:  Negative for involuntary urination.  Musculoskeletal:  Positive for joint pain, gait problem, joint pain, joint swelling, myalgias,  muscle weakness, morning stiffness, muscle tenderness and myalgias.  Skin:  Positive for sensitivity to sunlight. Negative for color change, rash and hair loss.  Allergic/Immunologic: Negative for susceptible to infections.  Neurological:  Negative for dizziness and headaches.  Hematological:  Negative for swollen glands.  Psychiatric/Behavioral:  Positive for sleep disturbance. Negative for depressed mood. The patient is nervous/anxious.     PMFS History:  Patient Active Problem List   Diagnosis Date Noted   CLL (chronic lymphocytic leukemia) (HCC) 12/29/2019   Leukocytosis 12/03/2019   Baker's cyst of knee 10/18/2011   Knee mass 10/04/2011    Past Medical History:  Diagnosis Date   Baker's cyst of knee 10/18/2011   CLL (chronic lymphocytic leukemia) (HCC)    per patient    Hypertension    Osteoarthritis    Thyroid disease     Family History  Problem Relation Age of Onset   Cancer Other    Diabetes Other    Cancer Mother        breast   COPD Mother    Leukemia Father    Osteoporosis Sister    Asthma Sister    Dermatomyositis Sister    COPD Sister    Kidney disease Sister    Cancer Son        lymphoma, colon   Diabetes Son    COPD Daughter    Osteoporosis Daughter    Rheum arthritis Daughter    Past Surgical History:  Procedure Laterality Date   FOOT SURGERY Right 2017  hammer toe    HAND SURGERY Left 2014   finger, hand   REVERSE TOTAL SHOULDER ARTHROPLASTY Right 09/08/2022   SKIN TAG REMOVAL  11/07/2018   stomach and below left eye    THYROID SURGERY     TUBAL LIGATION     VAGINAL HYSTERECTOMY     Social History   Social History Narrative   Not on file    There is no immunization history on file for this patient.   Objective: Vital Signs: BP 137/82 (BP Location: Left Arm, Patient Position: Sitting, Cuff Size: Normal)   Pulse 71   Resp 16   Ht 5\' 6"  (1.676 m)   Wt 246 lb (111.6 kg)   BMI 39.71 kg/m    Physical Exam Vitals and nursing note  reviewed.  Constitutional:      Appearance: She is well-developed.  HENT:     Head: Normocephalic and atraumatic.  Eyes:     Conjunctiva/sclera: Conjunctivae normal.  Cardiovascular:     Rate and Rhythm: Normal rate and regular rhythm.     Heart sounds: Normal heart sounds.  Pulmonary:     Effort: Pulmonary effort is normal.     Breath sounds: Normal breath sounds.  Abdominal:     General: Bowel sounds are normal.     Palpations: Abdomen is soft.  Musculoskeletal:     Cervical back: Normal range of motion.  Lymphadenopathy:     Cervical: No cervical adenopathy.  Skin:    General: Skin is warm and dry.     Capillary Refill: Capillary refill takes less than 2 seconds.  Neurological:     Mental Status: She is alert and oriented to person, place, and time.  Psychiatric:        Behavior: Behavior normal.      Musculoskeletal Exam: C-spine has limited range of motion without rotation.  Thoracic kyphosis noted.  Right shoulder replacement has limited range of motion.  Left shoulder has slightly limited range of motion.  Elbow joints, wrist joints, MCPs, PIPs, DIPs have good range of motion with no synovitis.  CMC, PIP, DIP thickening consistent with osteoarthritis of both hands.  Knee joints have good range of motion with no effusion.  CDAI Exam: CDAI Score: -- Patient Global: --; Provider Global: -- Swollen: --; Tender: -- Joint Exam 10/26/2023   No joint exam has been documented for this visit   There is currently no information documented on the homunculus. Go to the Rheumatology activity and complete the homunculus joint exam.  Investigation: No additional findings.  Imaging: No results found.  Recent Labs: Lab Results  Component Value Date   WBC 9.7 06/26/2023   HGB 12.1 06/26/2023   PLT 202 06/26/2023   NA 135 06/26/2023   K 3.7 06/26/2023   CL 97 (L) 06/26/2023   CO2 29 06/26/2023   GLUCOSE 100 (H) 06/26/2023   BUN 18 06/26/2023   CREATININE 1.08 (H)  06/26/2023   BILITOT 0.5 06/26/2023   ALKPHOS 70 06/26/2023   AST 20 06/26/2023   ALT 16 06/26/2023   PROT 6.4 (L) 06/26/2023   ALBUMIN 3.9 06/26/2023   CALCIUM 9.4 06/26/2023   GFRAA 45 (L) 03/29/2020   QFTBGOLDPLUS NEGATIVE 04/07/2022    Speciality Comments: PLQ eye exam: 02/15/2023 WNL. [facility]. Follow up in 6 months.  Procedures:  No procedures performed Allergies: Citrus and Sulfa antibiotics     Assessment / Plan:     Visit Diagnoses: Autoimmune disease (HCC) - ANA 1:160 NH,  dsDNA+, inflammatory arthritis. X-rays obtained in 2019 showed MCP narrowing consistent with inflammatory arthritis: She has not had any signs or symptoms of a flare.  She has clinically been doing well taking Plaquenil 200 mg 1 tablet by mouth daily.  She is tolerating Plaquenil without any side effects and has not had any recent gaps in therapy.  No synovitis was noted on examination today.  She has noticed some increased fatigue which he attributes to being more sedentary and having interrupted sleep at night.  Encouraged the patient to restart chair yoga and water exercise.  She has not had any recent rashes or symptoms of Raynaud's phenomenon.  She continues to have chronic sicca symptoms.  Discussed the use of over-the-counter products.  The following lab work will be obtained today for further evaluation.  She will remain on Plaquenil as prescribed.  She was advised to notify us if she develops signs or symptoms of a flare.  She will follow-up in the office in 5 months or sooner if needed. - Plan: Protein / creatinine ratio, urine, Anti-DNA antibody, double-stranded, C3 and C4, Sedimentation rate, ANA  High risk medication use - Plaquenil 200 mg 1 tablet by mouth daily.  PLQ eye exam: 02/15/2023 WNL. [facility].  Patient states that she is scheduled to see the ophthalmologist at the end of March and plans on having updated Plaquenil examination at that time.  She will have results  forwarded to our office. CBC and CMP updated on 09/14/23.  Orders for CBC and CMP released today. No recent or recurrent infections. - Plan: CBC with Differential/Platelet, COMPLETE METABOLIC PANEL WITH GFR  LFTs abnormal: LFTs were within normal limits on 09/15/2023.  CMP updated today.  Primary osteoarthritis of both hands: CMC, PIP, DIP thickening consistent with osteoarthritis of both hands.  No synovitis noted on examination today.  S/P reverse total shoulder arthroplasty, right - Fall on 09/04/2022.  Underwent surgery performed by Dr. Rennis Chris.  Chronic pain of right ankle - Under care of Dr. Charlsie Merles.  She had a cortisone injection on 08/30/22.  According to the patient the right achilles tendon ruptured during the fall in January 2024-not planning to proceed with surgical intervention.  Using a walker to assist with ambulation.  Age-related osteoporosis without current pathological fracture - DEXA on 10/28/20: The BMD measured at Femur Neck Left is 0.697 g/cm2 with a T-score of -2.5. DEXA updated on 03/19/23: The BMD measured at Femur Neck Left is 0.693 g/cm2 with a T-score of -2.5.  She is taking calcium and vitamin D supplement.  Using a walker to assist with ambulation.  No recent falls.  Encouraged the patient to use resistive bands and to go for water exercise. Humerus fracture in January 2024.  Patient has declined treatment for osteoporosis.  Plan to check vitamin D today.  Other medical conditions are listed as follows:  CLL (chronic lymphocytic leukemia) (HCC) - Under the care of Dr. Orinda Kenner appointment scheduled on 12/31/2023.  History of hypothyroidism  Granuloma annulare  History of hypertension: Pressure was 137/82 today in the office.  Vitamin D deficiency: She is taking vitamin D supplement daily.  Vitamin B12 deficiency -She is taking vitamin B12 500 mcg by mouth daily.  Patient requested to have vitamin B12 checked today.  Plan: Vitamin B12  Orders: Orders  Placed This Encounter  Procedures   Protein / creatinine ratio, urine   Anti-DNA antibody, double-stranded   C3 and C4   Sedimentation rate   CBC with Differential/Platelet  COMPLETE METABOLIC PANEL WITH GFR   ANA   Vitamin B12   No orders of the defined types were placed in this encounter.    Follow-Up Instructions: Return in about 5 months (around 03/27/2024) for Autoimmune Disease, Osteoarthritis.   Gearldine Bienenstock, PA-C  Note - This record has been created using Dragon software.  Chart creation errors have been sought, but may not always  have been located. Such creation errors do not reflect on  the standard of medical care.

## 2023-10-16 DIAGNOSIS — M531 Cervicobrachial syndrome: Secondary | ICD-10-CM | POA: Diagnosis not present

## 2023-10-16 DIAGNOSIS — M9901 Segmental and somatic dysfunction of cervical region: Secondary | ICD-10-CM | POA: Diagnosis not present

## 2023-10-16 DIAGNOSIS — M47812 Spondylosis without myelopathy or radiculopathy, cervical region: Secondary | ICD-10-CM | POA: Diagnosis not present

## 2023-10-16 DIAGNOSIS — M546 Pain in thoracic spine: Secondary | ICD-10-CM | POA: Diagnosis not present

## 2023-10-16 DIAGNOSIS — M9902 Segmental and somatic dysfunction of thoracic region: Secondary | ICD-10-CM | POA: Diagnosis not present

## 2023-10-26 ENCOUNTER — Encounter: Payer: Self-pay | Admitting: Physician Assistant

## 2023-10-26 ENCOUNTER — Ambulatory Visit: Payer: Medicare HMO | Attending: Physician Assistant | Admitting: Physician Assistant

## 2023-10-26 VITALS — BP 137/82 | HR 71 | Resp 16 | Ht 66.0 in | Wt 246.0 lb

## 2023-10-26 DIAGNOSIS — Z8639 Personal history of other endocrine, nutritional and metabolic disease: Secondary | ICD-10-CM

## 2023-10-26 DIAGNOSIS — G8929 Other chronic pain: Secondary | ICD-10-CM

## 2023-10-26 DIAGNOSIS — M810 Age-related osteoporosis without current pathological fracture: Secondary | ICD-10-CM | POA: Diagnosis not present

## 2023-10-26 DIAGNOSIS — R7989 Other specified abnormal findings of blood chemistry: Secondary | ICD-10-CM | POA: Diagnosis not present

## 2023-10-26 DIAGNOSIS — Z8679 Personal history of other diseases of the circulatory system: Secondary | ICD-10-CM

## 2023-10-26 DIAGNOSIS — E559 Vitamin D deficiency, unspecified: Secondary | ICD-10-CM | POA: Diagnosis not present

## 2023-10-26 DIAGNOSIS — L920 Granuloma annulare: Secondary | ICD-10-CM

## 2023-10-26 DIAGNOSIS — E538 Deficiency of other specified B group vitamins: Secondary | ICD-10-CM | POA: Diagnosis not present

## 2023-10-26 DIAGNOSIS — C9110 Chronic lymphocytic leukemia of B-cell type not having achieved remission: Secondary | ICD-10-CM | POA: Diagnosis not present

## 2023-10-26 DIAGNOSIS — M25571 Pain in right ankle and joints of right foot: Secondary | ICD-10-CM

## 2023-10-26 DIAGNOSIS — M19042 Primary osteoarthritis, left hand: Secondary | ICD-10-CM

## 2023-10-26 DIAGNOSIS — Z96611 Presence of right artificial shoulder joint: Secondary | ICD-10-CM

## 2023-10-26 DIAGNOSIS — M359 Systemic involvement of connective tissue, unspecified: Secondary | ICD-10-CM | POA: Diagnosis not present

## 2023-10-26 DIAGNOSIS — Z79899 Other long term (current) drug therapy: Secondary | ICD-10-CM

## 2023-10-26 DIAGNOSIS — M19041 Primary osteoarthritis, right hand: Secondary | ICD-10-CM | POA: Diagnosis not present

## 2023-10-26 NOTE — Patient Instructions (Signed)
Magnesium malate

## 2023-10-28 LAB — C3 AND C4
C3 Complement: 150 mg/dL (ref 83–193)
C4 Complement: 25 mg/dL (ref 15–57)

## 2023-10-28 LAB — ANTI-DNA ANTIBODY, DOUBLE-STRANDED: ds DNA Ab: 8 [IU]/mL — ABNORMAL HIGH

## 2023-10-28 LAB — VITAMIN B12: Vitamin B-12: 980 pg/mL (ref 200–1100)

## 2023-10-28 LAB — COMPLETE METABOLIC PANEL WITH GFR
AG Ratio: 2.1 (calc) (ref 1.0–2.5)
ALT: 10 U/L (ref 6–29)
AST: 17 U/L (ref 10–35)
Albumin: 4.4 g/dL (ref 3.6–5.1)
Alkaline phosphatase (APISO): 73 U/L (ref 37–153)
BUN/Creatinine Ratio: 18 (calc) (ref 6–22)
BUN: 21 mg/dL (ref 7–25)
CO2: 31 mmol/L (ref 20–32)
Calcium: 9.5 mg/dL (ref 8.6–10.4)
Chloride: 102 mmol/L (ref 98–110)
Creat: 1.19 mg/dL — ABNORMAL HIGH (ref 0.60–1.00)
Globulin: 2.1 g/dL (ref 1.9–3.7)
Glucose, Bld: 92 mg/dL (ref 65–99)
Potassium: 4.2 mmol/L (ref 3.5–5.3)
Sodium: 141 mmol/L (ref 135–146)
Total Bilirubin: 0.6 mg/dL (ref 0.2–1.2)
Total Protein: 6.5 g/dL (ref 6.1–8.1)
eGFR: 47 mL/min/{1.73_m2} — ABNORMAL LOW (ref >=60)

## 2023-10-28 LAB — CBC WITH DIFFERENTIAL/PLATELET
Absolute Lymphocytes: 4095 {cells}/uL — ABNORMAL HIGH (ref 850–3900)
Absolute Monocytes: 589 {cells}/uL (ref 200–950)
Basophils Absolute: 29 {cells}/uL (ref 0–200)
Basophils Relative: 0.3 %
Eosinophils Absolute: 181 {cells}/uL (ref 15–500)
Eosinophils Relative: 1.9 %
HCT: 37.2 % (ref 35.0–45.0)
Hemoglobin: 12.2 g/dL (ref 11.7–15.5)
MCH: 28.3 pg (ref 27.0–33.0)
MCHC: 32.8 g/dL (ref 32.0–36.0)
MCV: 86.3 fL (ref 80.0–100.0)
MPV: 11 fL (ref 7.5–12.5)
Monocytes Relative: 6.2 %
Neutro Abs: 4608 {cells}/uL (ref 1500–7800)
Neutrophils Relative %: 48.5 %
Platelets: 212 10*3/uL (ref 140–400)
RBC: 4.31 10*6/uL (ref 3.80–5.10)
RDW: 12.4 % (ref 11.0–15.0)
Total Lymphocyte: 43.1 %
WBC: 9.5 10*3/uL (ref 3.8–10.8)

## 2023-10-28 LAB — ANTI-NUCLEAR AB-TITER (ANA TITER): ANA Titer 1: 1:40 {titer} — ABNORMAL HIGH

## 2023-10-28 LAB — PROTEIN / CREATININE RATIO, URINE
Creatinine, Urine: 56 mg/dL (ref 20–275)
Protein/Creat Ratio: 71 mg/g{creat} (ref 24–184)
Protein/Creatinine Ratio: 0.071 mg/mg{creat} (ref 0.024–0.184)
Total Protein, Urine: 4 mg/dL — ABNORMAL LOW (ref 5–24)

## 2023-10-28 LAB — ANA: Anti Nuclear Antibody (ANA): POSITIVE — AB

## 2023-10-28 LAB — SEDIMENTATION RATE: Sed Rate: 9 mm/h (ref 0–30)

## 2023-10-29 NOTE — Progress Notes (Signed)
 No proteinuria.  dsDNA remains within indeterminate range-8-continues to trend down.  Complements WNL Absolute lymphocytes remain elevated-stable-patient has history of CLL.  Rest of CBC WNL.   Creatinine is elevated and GFR is low-has she been taking any NSAIDs?  ANA remains positive.  ESR WNL  Vitamin B12 WNL   Labs are not consistent with a flare.

## 2023-11-05 ENCOUNTER — Telehealth: Payer: Self-pay | Admitting: Cardiology

## 2023-11-05 NOTE — Telephone Encounter (Signed)
 Pt c/o medication issue:  1. Name of Medication: magnesium   2. How are you currently taking this medication (dosage and times per day)?    3. Are you having a reaction (difficulty breathing--STAT)? no  4. What is your medication issue? Calling to see if its okay for patient to take this medication. Please advise

## 2023-11-05 NOTE — Telephone Encounter (Signed)
 Rheumatoid MD wanted to know if pt can take magnesium and wanted her to check with cardiology.  Aware forwarding to MD for review/advisement and office would let them know once MD advises.  Husband agreeable to plan.

## 2023-11-06 NOTE — Telephone Encounter (Signed)
 Magnesium supplementation over-the-counter is fine.  Jacqueline Schlotzhauer [place], DO, [facility]

## 2023-11-06 NOTE — Telephone Encounter (Signed)
 Spoke with pt's husband and explained that per Dr. Odis Hollingshead it was okay to start magnesium OTC and to discuss with rheumatoid physician on what dosage to go by. Pt verbalized understanding and had no further questions at this time.

## 2023-11-21 ENCOUNTER — Other Ambulatory Visit: Payer: Self-pay | Admitting: Physician Assistant

## 2023-11-21 NOTE — Telephone Encounter (Signed)
 Last Fill: 08/10/2023   Eye exam: 11/19/2023 WNL   Labs: 10/26/2023 Creatinine is elevated and GFR is low Absolute lymphocytes remain elevated-stable-patient has history of CLL.  Rest of CBC WNL.     Next Visit: 04/04/2024  Last Visit: 10/26/2023  [id number] disease   Current Dose per office note 10/26/2023: Plaquenil 200 mg 1 tablet by mouth daily   Okay to refill Plaquenil?

## 2023-12-25 DIAGNOSIS — M546 Pain in thoracic spine: Secondary | ICD-10-CM | POA: Diagnosis not present

## 2023-12-25 DIAGNOSIS — M531 Cervicobrachial syndrome: Secondary | ICD-10-CM | POA: Diagnosis not present

## 2023-12-25 DIAGNOSIS — M47812 Spondylosis without myelopathy or radiculopathy, cervical region: Secondary | ICD-10-CM | POA: Diagnosis not present

## 2023-12-25 DIAGNOSIS — M9902 Segmental and somatic dysfunction of thoracic region: Secondary | ICD-10-CM | POA: Diagnosis not present

## 2023-12-25 DIAGNOSIS — M9901 Segmental and somatic dysfunction of cervical region: Secondary | ICD-10-CM | POA: Diagnosis not present

## 2023-12-26 DIAGNOSIS — M9901 Segmental and somatic dysfunction of cervical region: Secondary | ICD-10-CM | POA: Diagnosis not present

## 2023-12-26 DIAGNOSIS — M546 Pain in thoracic spine: Secondary | ICD-10-CM | POA: Diagnosis not present

## 2023-12-26 DIAGNOSIS — M531 Cervicobrachial syndrome: Secondary | ICD-10-CM | POA: Diagnosis not present

## 2023-12-26 DIAGNOSIS — M47812 Spondylosis without myelopathy or radiculopathy, cervical region: Secondary | ICD-10-CM | POA: Diagnosis not present

## 2023-12-26 DIAGNOSIS — M9902 Segmental and somatic dysfunction of thoracic region: Secondary | ICD-10-CM | POA: Diagnosis not present

## 2023-12-28 ENCOUNTER — Other Ambulatory Visit: Payer: Self-pay

## 2023-12-28 DIAGNOSIS — M9902 Segmental and somatic dysfunction of thoracic region: Secondary | ICD-10-CM | POA: Diagnosis not present

## 2023-12-28 DIAGNOSIS — M531 Cervicobrachial syndrome: Secondary | ICD-10-CM | POA: Diagnosis not present

## 2023-12-28 DIAGNOSIS — M546 Pain in thoracic spine: Secondary | ICD-10-CM | POA: Diagnosis not present

## 2023-12-28 DIAGNOSIS — M47812 Spondylosis without myelopathy or radiculopathy, cervical region: Secondary | ICD-10-CM | POA: Diagnosis not present

## 2023-12-28 DIAGNOSIS — M9901 Segmental and somatic dysfunction of cervical region: Secondary | ICD-10-CM | POA: Diagnosis not present

## 2023-12-28 DIAGNOSIS — C9110 Chronic lymphocytic leukemia of B-cell type not having achieved remission: Secondary | ICD-10-CM

## 2023-12-31 ENCOUNTER — Inpatient Hospital Stay: Payer: Medicare HMO | Attending: Hematology | Admitting: Hematology

## 2023-12-31 ENCOUNTER — Inpatient Hospital Stay: Payer: Medicare HMO

## 2023-12-31 VITALS — BP 145/83 | HR 78 | Temp 98.4°F | Resp 18 | Wt 251.9 lb

## 2023-12-31 DIAGNOSIS — M069 Rheumatoid arthritis, unspecified: Secondary | ICD-10-CM | POA: Insufficient documentation

## 2023-12-31 DIAGNOSIS — C9110 Chronic lymphocytic leukemia of B-cell type not having achieved remission: Secondary | ICD-10-CM | POA: Insufficient documentation

## 2023-12-31 DIAGNOSIS — E559 Vitamin D deficiency, unspecified: Secondary | ICD-10-CM | POA: Insufficient documentation

## 2023-12-31 DIAGNOSIS — E538 Deficiency of other specified B group vitamins: Secondary | ICD-10-CM | POA: Diagnosis not present

## 2023-12-31 DIAGNOSIS — Z803 Family history of malignant neoplasm of breast: Secondary | ICD-10-CM | POA: Diagnosis not present

## 2023-12-31 DIAGNOSIS — Z87891 Personal history of nicotine dependence: Secondary | ICD-10-CM | POA: Insufficient documentation

## 2023-12-31 DIAGNOSIS — M8580 Other specified disorders of bone density and structure, unspecified site: Secondary | ICD-10-CM | POA: Diagnosis not present

## 2023-12-31 DIAGNOSIS — R7989 Other specified abnormal findings of blood chemistry: Secondary | ICD-10-CM | POA: Insufficient documentation

## 2023-12-31 DIAGNOSIS — Z806 Family history of leukemia: Secondary | ICD-10-CM | POA: Insufficient documentation

## 2023-12-31 DIAGNOSIS — Z807 Family history of other malignant neoplasms of lymphoid, hematopoietic and related tissues: Secondary | ICD-10-CM | POA: Insufficient documentation

## 2023-12-31 LAB — CBC WITH DIFFERENTIAL/PLATELET
Abs Immature Granulocytes: 0.03 10*3/uL (ref 0.00–0.07)
Basophils Absolute: 0 10*3/uL (ref 0.0–0.1)
Basophils Relative: 1 %
Eosinophils Absolute: 0.2 10*3/uL (ref 0.0–0.5)
Eosinophils Relative: 2 %
HCT: 36.3 % (ref 36.0–46.0)
Hemoglobin: 11.9 g/dL — ABNORMAL LOW (ref 12.0–15.0)
Immature Granulocytes: 0 %
Lymphocytes Relative: 47 %
Lymphs Abs: 3.7 10*3/uL (ref 0.7–4.0)
MCH: 28.3 pg (ref 26.0–34.0)
MCHC: 32.8 g/dL (ref 30.0–36.0)
MCV: 86.4 fL (ref 80.0–100.0)
Monocytes Absolute: 0.5 10*3/uL (ref 0.1–1.0)
Monocytes Relative: 6 %
Neutro Abs: 3.5 10*3/uL (ref 1.7–7.7)
Neutrophils Relative %: 44 %
Platelets: 212 10*3/uL (ref 150–400)
RBC: 4.2 MIL/uL (ref 3.87–5.11)
RDW: 12.8 % (ref 11.5–15.5)
WBC: 7.9 10*3/uL (ref 4.0–10.5)
nRBC: 0 % (ref 0.0–0.2)

## 2023-12-31 LAB — VITAMIN D 25 HYDROXY (VIT D DEFICIENCY, FRACTURES): Vit D, 25-Hydroxy: 63.39 ng/mL (ref 30–100)

## 2023-12-31 LAB — COMPREHENSIVE METABOLIC PANEL WITH GFR
ALT: 14 U/L (ref 0–44)
AST: 19 U/L (ref 15–41)
Albumin: 3.8 g/dL (ref 3.5–5.0)
Alkaline Phosphatase: 67 U/L (ref 38–126)
Anion gap: 8 (ref 5–15)
BUN: 15 mg/dL (ref 8–23)
CO2: 27 mmol/L (ref 22–32)
Calcium: 9.1 mg/dL (ref 8.9–10.3)
Chloride: 101 mmol/L (ref 98–111)
Creatinine, Ser: 1.03 mg/dL — ABNORMAL HIGH (ref 0.44–1.00)
GFR, Estimated: 56 mL/min — ABNORMAL LOW (ref >60)
Glucose, Bld: 100 mg/dL — ABNORMAL HIGH (ref 70–99)
Potassium: 3.8 mmol/L (ref 3.5–5.1)
Sodium: 136 mmol/L (ref 135–145)
Total Bilirubin: 0.7 mg/dL (ref 0.0–1.2)
Total Protein: 6.1 g/dL — ABNORMAL LOW (ref 6.5–8.1)

## 2023-12-31 LAB — LACTATE DEHYDROGENASE: LDH: 164 U/L (ref 98–192)

## 2023-12-31 NOTE — Patient Instructions (Signed)
 [facility] at [facility] Discharge Instructions   You were seen and examined today by Dr. Ellin Saba.  He reviewed the results of your lab work which are normal/stable.   We will see you back in 6 months. Return as scheduled.    Thank you for choosing [facility] at [facility] to provide your oncology and hematology care.  To afford each patient quality time with our provider, please arrive at least 15 minutes before your scheduled appointment time.   If you have a lab appointment with the Cancer Center please come in thru the Main Entrance and check in at the main information desk.  You need to re-schedule your appointment should you arrive 10 or more minutes late.  We strive to give you quality time with our providers, and arriving late affects you and other patients whose appointments are after yours.  Also, if you no show three or more times for appointments you may be dismissed from the clinic at the providers discretion.     Again, thank you for choosing [facility].  Our hope is that these requests will decrease the amount of time that you wait before being seen by our physicians.       _____________________________________________________________  Should you have questions after your visit to [facility], please contact our office at [phone] and follow the prompts.  Our office hours are 8:00 a.m. and 4:30 p.m. Monday - Friday.  Please note that voicemails left after 4:00 p.m. may not be returned until the following business day.  We are closed weekends and major holidays.  You do have access to a nurse 24-7, just call the main number to the clinic [phone] and do not press any options, hold on the line and a nurse will answer the phone.    For prescription refill requests, have your pharmacy contact our office and allow 72 hours.    Due to Covid, you will need to wear a mask upon entering the hospital.  If you do not have a mask, a mask will be given to you at the Main Entrance upon arrival. For doctor visits, patients may have 1 support person age 36 or older with them. For treatment visits, patients can not have anyone with them due to social distancing guidelines and our immunocompromised population.

## 2023-12-31 NOTE — Progress Notes (Signed)
 [facility] [address], [place] [address]    Clinic Day:  12/31/2023  Referring physician: Roxene Cora, PA*  Patient Care Team: Earlene Gleason as PCP - General (Family Medicine) Olinda Bertrand, DO as PCP - Cardiology (Cardiology)   ASSESSMENT & PLAN:   Assessment: 1.  Clinical stage 0 CLL: -She was evaluated at the request of Dr. Alvira Josephs for elevated white count, predominantly lymphocytes since January 2019. -Denies any B symptoms.  Physical exam does not show any lymphadenopathy or splenomegaly. -We reviewed results of flow cytometry from 12/03/2019.  Monoclonal B-cell population with coexpression of CD5 comprises 36% of lymphocytes.  Overall the morphology and immunophenotype favor CLL.  However there is a small chance of it being atypical mantle cell lymphoma.   2.  Rheumatoid arthritis: -Small joints of the hands involved.  Plaquenil  did not help. -She is taking Tylenol  and NSAIDs.   3.  Osteopenia: -DEXA scan on 02/07/2018 shows T score -2.3. -Vitamin D  level was borderline low at 24.   4.  Vitamin B12 deficiency: -B12 level was low at 177.  She was given B12 injection today.  She was told to start taking B12 1 mg tablet daily.    Plan: 1.  Clinical stage 0 CLL: - She denies any fevers, night sweats or weight loss in the last 6 months. - No infections in the last 6 months reported. - Physical exam: No palpable adenopathy in the neck or axillary region. - Labs from 12/31/2023: Normal LFTs.  Mildly elevated creatinine of 1.03 stable.  LDH normal.  CBC grossly normal with elevated absolute lymphocyte count. - No indication for treatment at this time.  Will do further workup with CLL FISH panel, T p53 mutation and IgH mutation status if there is any significant changes in the blood work or symptoms.  RTC 6 months for follow-up with repeat labs and exam.   2.  Rheumatoid arthritis: - Plaquenil  was started in January 2024 with  improvement in joint swellings.   3.  Osteopenia: - She will continue vitamin D  supplements.     Orders Placed This Encounter  Procedures   CBC with Differential    Standing Status:   Future    Expected Date:   06/30/2024    Expiration Date:   12/30/2024   Comprehensive metabolic panel    Standing Status:   Future    Expected Date:   06/30/2024    Expiration Date:   12/30/2024   Lactate dehydrogenase    Standing Status:   Future    Expected Date:   06/30/2024    Expiration Date:   12/30/2024      Jacqueline Casey,acting as a scribe for Paulett Boros, MD.,have documented all relevant documentation on the behalf of Paulett Boros, MD,as directed by  Paulett Boros, MD while in the presence of Paulett Boros, MD.  I, Paulett Boros MD, have reviewed the above documentation for accuracy and completeness, and I agree with the above.     Paulett Boros, MD   5/12/202512:28 PM  CHIEF COMPLAINT:   Diagnosis: CLL    Cancer Staging  No matching staging information was found for the patient.    Prior Therapy: none  Current Therapy:  surveillance   HISTORY OF PRESENT ILLNESS:   Oncology History   No history exists.     INTERVAL HISTORY:   Jacqueline Casey is a 78 y.o. female presenting to clinic today for follow up of CLL. She  was last seen by me on 07/03/23.  Today, she states that she is doing well overall. Her appetite level is at 100%. Her energy level is at 80%.   PAST MEDICAL HISTORY:   Past Medical History: Past Medical History:  Diagnosis Date   Baker's cyst of knee 10/18/2011   CLL (chronic lymphocytic leukemia) (HCC)    per patient    Hypertension    Osteoarthritis    Thyroid  disease     Surgical History: Past Surgical History:  Procedure Laterality Date   FOOT SURGERY Right 2017   hammer toe    HAND SURGERY Left 2014   finger, hand   REVERSE TOTAL SHOULDER ARTHROPLASTY Right 09/08/2022   SKIN TAG REMOVAL  11/07/2018    stomach and below left eye    THYROID  SURGERY     TUBAL LIGATION     VAGINAL HYSTERECTOMY      Social History: Social History   Socioeconomic History   Marital status: Married    Spouse name: Not on file   Number of children: 3   Years of education: 12   Highest education level: Not on file  Occupational History   Not on file  Tobacco Use   Smoking status: Former    Current packs/day: 0.00    Average packs/day: 1.5 packs/day for 15.0 years (22.5 ttl pk-yrs)    Types: Cigarettes    Start date: 07/1984    Quit date: 07/1999    Years since quitting: 24.4    Passive exposure: Never   Smokeless tobacco: Never  Vaping Use   Vaping status: Never Used  Substance and Sexual Activity   Alcohol use: No   Drug use: No   Sexual activity: Not on file  Other Topics Concern   Not on file  Social History Narrative   Not on file   Social Drivers of Health   Financial Resource Strain: Not on file  Food Insecurity: Not on file  Transportation Needs: No Transportation Needs (10/11/2020)   PRAPARE - Transportation    Lack of Transportation (Medical): No    Lack of Transportation (Non-Medical): No  Physical Activity: Inactive (10/11/2020)   Exercise Vital Sign    Days of Exercise per Week: 0 days    Minutes of Exercise per Session: 0 min  Stress: Not on file  Social Connections: Not on file  Intimate Partner Violence: Not At Risk (10/11/2020)   Humiliation, Afraid, Rape, and Kick questionnaire    Fear of Current or Ex-Partner: No    Emotionally Abused: No    Physically Abused: No    Sexually Abused: No    Family History: Family History  Problem Relation Age of Onset   Cancer Other    Diabetes Other    Cancer Mother        breast   COPD Mother    Leukemia Father    Osteoporosis Sister    Asthma Sister    Dermatomyositis Sister    COPD Sister    Kidney disease Sister    Cancer Son        lymphoma, colon   Diabetes Son    COPD Daughter    Osteoporosis Daughter     Rheum arthritis Daughter     Current Medications:  Current Outpatient Medications:    Acetaminophen  (TYLENOL  PO), Take 1 tablet by mouth as needed (pain)., Disp: , Rfl:    ASPIRIN 81 PO, Take 1 tablet by mouth every other day., Disp: , Rfl:  b complex vitamins capsule, Take 1 capsule by mouth daily., Disp: , Rfl:    CALCIUM PO, Take 1 capsule by mouth daily. 1000 mg, Disp: , Rfl:    cetirizine (ZYRTEC) 10 MG tablet, Take 10 mg by mouth daily., Disp: , Rfl:    hydrochlorothiazide (HYDRODIURIL) 12.5 MG tablet, Take 12.5 mg by mouth daily., Disp: , Rfl:    hydroxychloroquine  (PLAQUENIL ) 200 MG tablet, Take 1 tablet by mouth once daily, Disp: 90 tablet, Rfl: 0   ketoconazole (NIZORAL) 2 % cream, Apply topically 2 (two) times daily as needed., Disp: , Rfl:    levothyroxine (SYNTHROID, LEVOTHROID) 88 MCG tablet, Take 88 mcg by mouth daily., Disp: , Rfl:    omeprazole  (PRILOSEC) 20 MG capsule, Take 20 mg by mouth 2 (two) times daily., Disp: , Rfl:    TART CHERRY PO, Take 1 capsule by mouth daily., Disp: , Rfl:    TURMERIC CURCUMIN PO, Take 1 capsule by mouth daily., Disp: , Rfl:    vitamin B-12 (CYANOCOBALAMIN ) 500 MCG tablet, Take 500 mcg by mouth daily., Disp: , Rfl:    VITAMIN D  PO, Take 1 capsule by mouth daily., Disp: , Rfl:    Allergies: Allergies  Allergen Reactions   Citrus Swelling    Makes her lips swell   Sulfa Antibiotics Hives    REVIEW OF SYSTEMS:   Review of Systems  Constitutional:  Negative for chills, fatigue and fever.  HENT:   Negative for lump/mass, mouth sores, nosebleeds, sore throat and trouble swallowing.   Eyes:  Negative for eye problems.  Respiratory:  Positive for cough. Negative for shortness of breath.   Cardiovascular:  Negative for chest pain, leg swelling and palpitations.  Gastrointestinal:  Positive for nausea. Negative for abdominal pain, constipation, diarrhea and vomiting.  Genitourinary:  Negative for bladder incontinence, difficulty urinating,  dysuria, frequency, hematuria and nocturia.   Musculoskeletal:  Positive for arthralgias. Negative for back pain, flank pain, myalgias and neck pain.  Skin:  Negative for itching and rash.  Neurological:  Negative for dizziness, headaches and numbness.  Hematological:  Does not bruise/bleed easily.  Psychiatric/Behavioral:  Positive for sleep disturbance. Negative for depression and suicidal ideas. The patient is not nervous/anxious.   All other systems reviewed and are negative.    VITALS:   Blood pressure (!) 145/83, pulse 78, temperature 98.4 F (36.9 C), temperature source Oral, resp. rate 18, weight 251 lb 14.4 oz (114.3 kg), SpO2 98%.  Wt Readings from Last 3 Encounters:  12/31/23 251 lb 14.4 oz (114.3 kg)  10/26/23 246 lb (111.6 kg)  10/01/23 245 lb 9.6 oz (111.4 kg)    Body mass index is 40.66 kg/m.  Performance status (ECOG): 1 - Symptomatic but completely ambulatory  PHYSICAL EXAM:   Physical Exam Vitals and nursing note reviewed. Exam conducted with a chaperone present.  Constitutional:      Appearance: Normal appearance.  Cardiovascular:     Rate and Rhythm: Normal rate and regular rhythm.     Pulses: Normal pulses.     Heart sounds: Normal heart sounds.  Pulmonary:     Effort: Pulmonary effort is normal.     Breath sounds: Normal breath sounds.  Abdominal:     Palpations: Abdomen is soft. There is no hepatomegaly, splenomegaly or mass.     Tenderness: There is no abdominal tenderness.  Musculoskeletal:     Right lower leg: No edema.     Left lower leg: No edema.  Lymphadenopathy:  Cervical: No cervical adenopathy.     Right cervical: No superficial, deep or posterior cervical adenopathy.    Left cervical: No superficial, deep or posterior cervical adenopathy.     Upper Body:     Right upper body: No supraclavicular or axillary adenopathy.     Left upper body: No supraclavicular or axillary adenopathy.  Neurological:     General: No focal deficit  present.     Mental Status: She is alert and oriented to person, place, and time.  Psychiatric:        Mood and Affect: Mood normal.        Behavior: Behavior normal.     LABS:      Latest Ref Rng & Units 12/31/2023   10:15 AM 10/26/2023   11:21 AM 06/26/2023   12:10 PM  CBC  WBC 4.0 - 10.5 K/uL 7.9  9.5  9.7   Hemoglobin 12.0 - 15.0 g/dL 16.1  09.6  04.5   Hematocrit 36.0 - 46.0 % 36.3  37.2  37.4   Platelets 150 - 400 K/uL 212  212  202       Latest Ref Rng & Units 12/31/2023   10:15 AM 10/26/2023   11:21 AM 06/26/2023   12:10 PM  CMP  Glucose 70 - 99 mg/dL 409  92  811   BUN 8 - 23 mg/dL 15  21  18    Creatinine 0.44 - 1.00 mg/dL 9.14  7.82  9.56   Sodium 135 - 145 mmol/L 136  141  135   Potassium 3.5 - 5.1 mmol/L 3.8  4.2  3.7   Chloride 98 - 111 mmol/L 101  102  97   CO2 22 - 32 mmol/L 27  31  29    Calcium 8.9 - 10.3 mg/dL 9.1  9.5  9.4   Total Protein 6.5 - 8.1 g/dL 6.1  6.5  6.4   Total Bilirubin 0.0 - 1.2 mg/dL 0.7  0.6  0.5   Alkaline Phos 38 - 126 U/L 67   70   AST 15 - 41 U/L 19  17  20    ALT 0 - 44 U/L 14  10  16       No results found for: "CEA1", "CEA" / No results found for: "CEA1", "CEA" No results found for: "PSA1" No results found for: "OZH086" No results found for: "CAN125"  Lab Results  Component Value Date   ALBUMINELP 4.1 04/07/2022   A1GS 0.3 04/07/2022   A2GS 0.7 04/07/2022   BETS 0.5 04/07/2022   BETA2SER 0.4 04/07/2022   GAMS 0.7 (L) 04/07/2022   SPEI  04/07/2022     Comment:     . Consistent with hypogammaglobulinemia. Serum free light  chains or urine immunofixation should be considered if  plasma cell dyscrasias are a possible clinical  diagnosis. .    No results found for: "TIBC", "FERRITIN", "IRONPCTSAT" Lab Results  Component Value Date   LDH 164 12/31/2023   LDH 158 06/26/2023   LDH 157 12/08/2022     STUDIES:   No results found.

## 2024-01-11 DIAGNOSIS — Z6838 Body mass index (BMI) 38.0-38.9, adult: Secondary | ICD-10-CM | POA: Diagnosis not present

## 2024-01-11 DIAGNOSIS — B029 Zoster without complications: Secondary | ICD-10-CM | POA: Diagnosis not present

## 2024-01-11 DIAGNOSIS — E6609 Other obesity due to excess calories: Secondary | ICD-10-CM | POA: Diagnosis not present

## 2024-03-25 NOTE — Progress Notes (Signed)
 Office Visit Note  Patient: Jacqueline Casey             Date of Birth: February 10, 1946           MRN: [id number]             PCP: Leonce Lucie PARAS, PA-C Referring: Leonce Lucie PARAS, [place]* Visit Date: 04/04/2024 Occupation: @GUAROCC @  Subjective:  Medication management  History of Present Illness: Jacqueline Casey is a 78 y.o. female with autoimmune disease and osteoarthritis.  She states she continues to have some discomfort in her right knee due to Baker's cyst.  She states Dr. Margrette try to aspirate but it was Stabilized and could not be aspirated.  She continues to have discomfort in that region.  She has been taking hydroxychloroquine  200 mg p.o. daily.  None of the other joints are painful.  She has been seeing Dr. Magdalen for the right ankle and right foot.  He did not advise surgery at this point.  Right shoulder is doing well.   Activities of Daily Living:  Patient reports morning stiffness for 1 hour.   Patient Reports nocturnal pain.  Difficulty dressing/grooming: Reports Difficulty climbing stairs: Reports Difficulty getting out of chair: Reports Difficulty using hands for taps, buttons, cutlery, and/or writing: Reports  Review of Systems  Constitutional:  Positive for fatigue.  HENT:  Positive for mouth dryness. Negative for mouth sores.   Eyes:  Positive for dryness.  Respiratory:  Negative for shortness of breath.   Cardiovascular:  Negative for chest pain and palpitations.  Gastrointestinal:  Negative for blood in stool, constipation and diarrhea.  Endocrine: Negative for increased urination.  Genitourinary:  Negative for involuntary urination.  Musculoskeletal:  Positive for joint pain, gait problem, joint pain, joint swelling, myalgias, muscle weakness, morning stiffness, muscle tenderness and myalgias.  Skin:  Negative for color change, rash, hair loss and sensitivity to sunlight.  Allergic/Immunologic: Negative for susceptible to infections.  Neurological:   Negative for dizziness and headaches.  Hematological:  Negative for swollen glands.  Psychiatric/Behavioral:  Positive for sleep disturbance. Negative for depressed mood. The patient is not nervous/anxious.     PMFS History:  Patient Active Problem List   Diagnosis Date Noted   CLL (chronic lymphocytic leukemia) (HCC) 12/29/2019   Leukocytosis 12/03/2019   Baker's cyst of knee 10/18/2011   Knee mass 10/04/2011    Past Medical History:  Diagnosis Date   Baker's cyst of knee 10/18/2011   CLL (chronic lymphocytic leukemia) (HCC)    per patient    Hypertension    Osteoarthritis    Thyroid  disease     Family History  Problem Relation Age of Onset   Cancer Other    Diabetes Other    Cancer Mother        breast   COPD Mother    Leukemia Father    Osteoporosis Sister    Asthma Sister    Dermatomyositis Sister    COPD Sister    Kidney disease Sister    Cancer Son        lymphoma, colon   Diabetes Son    COPD Daughter    Osteoporosis Daughter    Rheum arthritis Daughter    Past Surgical History:  Procedure Laterality Date   FOOT SURGERY Right 2017   hammer toe    HAND SURGERY Left 2014   finger, hand   REVERSE TOTAL SHOULDER ARTHROPLASTY Right 09/08/2022   SKIN TAG REMOVAL  11/07/2018   stomach and  below left eye    THYROID  SURGERY     TUBAL LIGATION     VAGINAL HYSTERECTOMY     Social History   Social History Narrative   Not on file    There is no immunization history on file for this patient.   Objective: Vital Signs: BP (!) 152/79 (BP Location: Right Arm, Patient Position: Sitting, Cuff Size: Normal)   Pulse 73   Resp 15   Ht 5' 6.5 (1.689 m)   Wt 250 lb 9.6 oz (113.7 kg)   BMI 39.84 kg/m    Physical Exam Vitals and nursing note reviewed.  Constitutional:      Appearance: She is well-developed.  HENT:     Head: Normocephalic and atraumatic.  Eyes:     Conjunctiva/sclera: Conjunctivae normal.  Cardiovascular:     Rate and Rhythm: Normal rate  and regular rhythm.     Heart sounds: Normal heart sounds.  Pulmonary:     Effort: Pulmonary effort is normal.     Breath sounds: Normal breath sounds.  Abdominal:     General: Bowel sounds are normal.     Palpations: Abdomen is soft.  Musculoskeletal:     Cervical back: Normal range of motion.  Lymphadenopathy:     Cervical: No cervical adenopathy.  Skin:    General: Skin is warm and dry.     Capillary Refill: Capillary refill takes less than 2 seconds.  Neurological:     Mental Status: She is alert and oriented to person, place, and time.  Psychiatric:        Behavior: Behavior normal.      Musculoskeletal Exam: Patient was examined in the seated position.  She had limited range of motion of the cervical spine.  Thoracic kyphosis was noted without any tenderness.  Both shoulder joints had some limitation with range of motion the right shoulder joint was replaced.  Elbows and wrist joints with good range of motion.  There was no synovitis over MCPs PIPs or DIPs.  Hip joints could not be assessed in the seated position.  Knee joints were in good range of motion.  There was no tenderness over ankles or MTPs.  CDAI Exam: CDAI Score: -- Patient Global: --; Provider Global: -- Swollen: --; Tender: -- Joint Exam 04/04/2024   No joint exam has been documented for this visit   There is currently no information documented on the homunculus. Go to the Rheumatology activity and complete the homunculus joint exam.  Investigation: No additional findings.  Imaging: No results found.  Recent Labs: Lab Results  Component Value Date   WBC 7.9 12/31/2023   HGB 11.9 (L) 12/31/2023   PLT 212 12/31/2023   NA 136 12/31/2023   K 3.8 12/31/2023   CL 101 12/31/2023   CO2 27 12/31/2023   GLUCOSE 100 (H) 12/31/2023   BUN 15 12/31/2023   CREATININE 1.03 (H) 12/31/2023   BILITOT 0.7 12/31/2023   ALKPHOS 67 12/31/2023   AST 19 12/31/2023   ALT 14 12/31/2023   PROT 6.1 (L) 12/31/2023    ALBUMIN 3.8 12/31/2023   CALCIUM 9.1 12/31/2023   GFRAA 45 (L) 03/29/2020   QFTBGOLDPLUS NEGATIVE 04/07/2022    Speciality Comments: PLQ eye exam: 11/19/2023 WNL. [facility]. Follow up in 6 months.  Procedures:  No procedures performed Allergies: Citrus and Sulfa antibiotics   Assessment / Plan:     Visit Diagnoses: Autoimmune disease (HCC) - ANA 1:160 NH, dsDNA+, inflammatory arthritis. X-rays obtained in 2019  showed MCP narrowing consistent with inflammatory arthritis -patient has done well on hydroxychloroquine .  She denies any joint swelling.  She has been taking hydroxychloroquine  200 mg p.o. daily without any interruption.  October 26, 2023 double-stranded ENA was +88, complements were normal and sed rate was normal.  Will recheck labs today.  Plan: Protein / creatinine ratio, urine, ANA, Anti-DNA antibody, double-stranded, C3 and C4, Sedimentation rate  High risk medication use - Plaquenil  200 mg 1 tablet by mouth daily. PLQ eye exam: 02/15/2023 WNL. [facility]. -Patient gets eye exams every 6 months per patient.  Plan: CBC with Differential/Platelet, Comprehensive metabolic panel with GFR today and then in 5 months.  Primary osteoarthritis of both hands -no synovitis was noted.  CMC, PIP, DIP thickening consistent with osteoarthritis of both hands.  S/P reverse total shoulder arthroplasty, right - Fall on 09/04/2022.  Underwent surgery performed by Dr. Melita.  She continues to have some limitation with range of motion without any discomfort.  Chronic pain of right ankle -she is under care of Dr. Regal.cortisone injection on 08/30/22.right achilles tendon ruptured-fall in January 2024-not planning to proceed with surgical intervention.  Age-related osteoporosis without current pathological fracture- DEXA on 10/28/20: The BMD measured at Femur Neck Left is 0.697 g/cm2 with a T-score of -2.5. DEXA updated on 03/19/23: The BMD measured at Femur Neck Left is 0.693 g/cm2 with  a T-score of -2.5.  She is taking calcium and vitamin D  supplement.  Using a walker to assist with ambulation.  No recent falls.  Encouraged the patient to use resistive bands and to go for water exercise. Humerus fracture in January 2024.  Patient declined treatment today.  Plan to check vitamin D  today.  CLL (chronic lymphocytic leukemia) (HCC)-followed by oncology.  History of hypothyroidism  Granuloma annulare  History of hypertension  Vitamin D  deficiency  Vitamin B12 deficiency  Orders: Orders Placed This Encounter  Procedures   CBC with Differential/Platelet   Comprehensive metabolic panel with GFR   Protein / creatinine ratio, urine   ANA   Anti-DNA antibody, double-stranded   C3 and C4   Sedimentation rate   No orders of the defined types were placed in this encounter.   Follow-Up Instructions: Return in about 5 months (around 09/04/2024) for Autoimmune disease.   Maya Nash, MD  Note - This record has been created using Animal nutritionist.  Chart creation errors have been sought, but may not always  have been located. Such creation errors do not reflect on  the standard of medical care.

## 2024-04-04 ENCOUNTER — Encounter: Payer: Self-pay | Admitting: Rheumatology

## 2024-04-04 ENCOUNTER — Ambulatory Visit: Attending: Rheumatology | Admitting: Rheumatology

## 2024-04-04 VITALS — BP 129/75 | HR 69 | Resp 15 | Ht 66.5 in | Wt 250.6 lb

## 2024-04-04 DIAGNOSIS — C9110 Chronic lymphocytic leukemia of B-cell type not having achieved remission: Secondary | ICD-10-CM

## 2024-04-04 DIAGNOSIS — Z79899 Other long term (current) drug therapy: Secondary | ICD-10-CM | POA: Diagnosis not present

## 2024-04-04 DIAGNOSIS — M810 Age-related osteoporosis without current pathological fracture: Secondary | ICD-10-CM | POA: Diagnosis not present

## 2024-04-04 DIAGNOSIS — M25571 Pain in right ankle and joints of right foot: Secondary | ICD-10-CM

## 2024-04-04 DIAGNOSIS — M19041 Primary osteoarthritis, right hand: Secondary | ICD-10-CM

## 2024-04-04 DIAGNOSIS — G8929 Other chronic pain: Secondary | ICD-10-CM

## 2024-04-04 DIAGNOSIS — R7989 Other specified abnormal findings of blood chemistry: Secondary | ICD-10-CM

## 2024-04-04 DIAGNOSIS — Z96611 Presence of right artificial shoulder joint: Secondary | ICD-10-CM

## 2024-04-04 DIAGNOSIS — M359 Systemic involvement of connective tissue, unspecified: Secondary | ICD-10-CM

## 2024-04-04 DIAGNOSIS — M19042 Primary osteoarthritis, left hand: Secondary | ICD-10-CM

## 2024-04-04 DIAGNOSIS — Z8639 Personal history of other endocrine, nutritional and metabolic disease: Secondary | ICD-10-CM | POA: Diagnosis not present

## 2024-04-04 DIAGNOSIS — E538 Deficiency of other specified B group vitamins: Secondary | ICD-10-CM

## 2024-04-04 DIAGNOSIS — L920 Granuloma annulare: Secondary | ICD-10-CM | POA: Diagnosis not present

## 2024-04-04 DIAGNOSIS — E559 Vitamin D deficiency, unspecified: Secondary | ICD-10-CM

## 2024-04-04 DIAGNOSIS — Z8679 Personal history of other diseases of the circulatory system: Secondary | ICD-10-CM

## 2024-04-04 NOTE — Patient Instructions (Signed)
 Vaccines You are taking a medication(s) that can suppress your immune system.  The following immunizations are recommended: Flu annually Covid-19  Td/Tdap (tetanus, diphtheria, pertussis) every 10 years Pneumonia (Prevnar 15 then Pneumovax 23 at least 1 year apart.  Alternatively, can take Prevnar 20 without needing additional dose) Shingrix: 2 doses from 4 weeks to 6 months apart  Please check with your PCP to make sure you are up to date.

## 2024-04-06 LAB — COMPREHENSIVE METABOLIC PANEL WITH GFR
AG Ratio: 2.2 (calc) (ref 1.0–2.5)
ALT: 11 U/L (ref 6–29)
AST: 18 U/L (ref 10–35)
Albumin: 4.4 g/dL (ref 3.6–5.1)
Alkaline phosphatase (APISO): 76 U/L (ref 37–153)
BUN/Creatinine Ratio: 12 (calc) (ref 6–22)
BUN: 15 mg/dL (ref 7–25)
CO2: 31 mmol/L (ref 20–32)
Calcium: 9.6 mg/dL (ref 8.6–10.4)
Chloride: 101 mmol/L (ref 98–110)
Creat: 1.29 mg/dL — ABNORMAL HIGH (ref 0.60–1.00)
Globulin: 2 g/dL (ref 1.9–3.7)
Glucose, Bld: 98 mg/dL (ref 65–99)
Potassium: 4.1 mmol/L (ref 3.5–5.3)
Sodium: 141 mmol/L (ref 135–146)
Total Bilirubin: 0.6 mg/dL (ref 0.2–1.2)
Total Protein: 6.4 g/dL (ref 6.1–8.1)
eGFR: 43 mL/min/1.73m2 — ABNORMAL LOW (ref >=60)

## 2024-04-06 LAB — CBC WITH DIFFERENTIAL/PLATELET
Absolute Lymphocytes: 3901 {cells}/uL — ABNORMAL HIGH (ref 850–3900)
Absolute Monocytes: 524 {cells}/uL (ref 200–950)
Basophils Absolute: 46 {cells}/uL (ref 0–200)
Basophils Relative: 0.5 %
Eosinophils Absolute: 147 {cells}/uL (ref 15–500)
Eosinophils Relative: 1.6 %
HCT: 38.1 % (ref 35.0–45.0)
Hemoglobin: 12.3 g/dL (ref 11.7–15.5)
MCH: 27.6 pg (ref 27.0–33.0)
MCHC: 32.3 g/dL (ref 32.0–36.0)
MCV: 85.6 fL (ref 80.0–100.0)
MPV: 10.7 fL (ref 7.5–12.5)
Monocytes Relative: 5.7 %
Neutro Abs: 4582 {cells}/uL (ref 1500–7800)
Neutrophils Relative %: 49.8 %
Platelets: 218 Thousand/uL (ref 140–400)
RBC: 4.45 Million/uL (ref 3.80–5.10)
RDW: 13 % (ref 11.0–15.0)
Total Lymphocyte: 42.4 %
WBC: 9.2 Thousand/uL (ref 3.8–10.8)

## 2024-04-06 LAB — ANA: Anti Nuclear Antibody (ANA): POSITIVE — AB

## 2024-04-06 LAB — ANTI-NUCLEAR AB-TITER (ANA TITER)
ANA TITER: 1:40 {titer} — ABNORMAL HIGH
ANA Titer 1: 1:80 {titer} — ABNORMAL HIGH

## 2024-04-06 LAB — ANTI-DNA ANTIBODY, DOUBLE-STRANDED: ds DNA Ab: 7 [IU]/mL — ABNORMAL HIGH

## 2024-04-06 LAB — PROTEIN / CREATININE RATIO, URINE
Creatinine, Urine: 127 mg/dL (ref 20–275)
Protein/Creat Ratio: 110 mg/g{creat} (ref 24–184)
Protein/Creatinine Ratio: 0.11 mg/mg{creat} (ref 0.024–0.184)
Total Protein, Urine: 14 mg/dL (ref 5–24)

## 2024-04-06 LAB — C3 AND C4
C3 Complement: 153 mg/dL (ref 83–193)
C4 Complement: 27 mg/dL (ref 15–57)

## 2024-04-06 LAB — SEDIMENTATION RATE: Sed Rate: 6 mm/h (ref 0–30)

## 2024-04-07 ENCOUNTER — Ambulatory Visit: Payer: Self-pay | Admitting: Rheumatology

## 2024-04-07 NOTE — Telephone Encounter (Signed)
 Patient stated she started taking Turmeric an would like to know if that would make any changes with recent labs ?

## 2024-04-07 NOTE — Progress Notes (Signed)
 Turmeric may affect liver functions look rarely but should not affect kidney function.  Elevated kidney function is most likely due to diuretic use.

## 2024-04-07 NOTE — Progress Notes (Signed)
 CBC normal, CMP shows elevated creatinine, most likely due to diuretic use.  Patient should avoid NSAIDs.SABRA  ANA is low titer positive and stable, dsDNA is positive and stable, urine protein creatinine ratio normal, complements normal, sed rate normal.  Labs do not indicate an autoimmune disease flare.  Please forward results to her PCP.

## 2024-04-10 DIAGNOSIS — L820 Inflamed seborrheic keratosis: Secondary | ICD-10-CM | POA: Diagnosis not present

## 2024-04-24 DIAGNOSIS — E6609 Other obesity due to excess calories: Secondary | ICD-10-CM | POA: Diagnosis not present

## 2024-04-24 DIAGNOSIS — Z6838 Body mass index (BMI) 38.0-38.9, adult: Secondary | ICD-10-CM | POA: Diagnosis not present

## 2024-04-24 DIAGNOSIS — N181 Chronic kidney disease, stage 1: Secondary | ICD-10-CM | POA: Diagnosis not present

## 2024-04-24 DIAGNOSIS — M7121 Synovial cyst of popliteal space [Baker], right knee: Secondary | ICD-10-CM | POA: Diagnosis not present

## 2024-04-24 DIAGNOSIS — R0789 Other chest pain: Secondary | ICD-10-CM | POA: Diagnosis not present

## 2024-05-26 DIAGNOSIS — N181 Chronic kidney disease, stage 1: Secondary | ICD-10-CM | POA: Diagnosis not present

## 2024-05-26 DIAGNOSIS — R142 Eructation: Secondary | ICD-10-CM | POA: Diagnosis not present

## 2024-05-26 DIAGNOSIS — R110 Nausea: Secondary | ICD-10-CM | POA: Diagnosis not present

## 2024-05-26 DIAGNOSIS — M0579 Rheumatoid arthritis with rheumatoid factor of multiple sites without organ or systems involvement: Secondary | ICD-10-CM | POA: Diagnosis not present

## 2024-06-03 DIAGNOSIS — M0579 Rheumatoid arthritis with rheumatoid factor of multiple sites without organ or systems involvement: Secondary | ICD-10-CM | POA: Diagnosis not present

## 2024-06-03 DIAGNOSIS — J069 Acute upper respiratory infection, unspecified: Secondary | ICD-10-CM | POA: Diagnosis not present

## 2024-06-12 DIAGNOSIS — M25561 Pain in right knee: Secondary | ICD-10-CM | POA: Diagnosis not present

## 2024-06-12 DIAGNOSIS — M1711 Unilateral primary osteoarthritis, right knee: Secondary | ICD-10-CM | POA: Diagnosis not present

## 2024-06-12 DIAGNOSIS — M170 Bilateral primary osteoarthritis of knee: Secondary | ICD-10-CM | POA: Diagnosis not present

## 2024-06-12 DIAGNOSIS — M1712 Unilateral primary osteoarthritis, left knee: Secondary | ICD-10-CM | POA: Diagnosis not present

## 2024-06-20 ENCOUNTER — Other Ambulatory Visit: Payer: Self-pay

## 2024-06-20 DIAGNOSIS — C9110 Chronic lymphocytic leukemia of B-cell type not having achieved remission: Secondary | ICD-10-CM

## 2024-06-23 ENCOUNTER — Inpatient Hospital Stay: Attending: Physician Assistant

## 2024-06-23 ENCOUNTER — Other Ambulatory Visit: Payer: Self-pay | Admitting: Physician Assistant

## 2024-06-23 DIAGNOSIS — Z807 Family history of other malignant neoplasms of lymphoid, hematopoietic and related tissues: Secondary | ICD-10-CM | POA: Diagnosis not present

## 2024-06-23 DIAGNOSIS — R7402 Elevation of levels of lactic acid dehydrogenase (LDH): Secondary | ICD-10-CM | POA: Insufficient documentation

## 2024-06-23 DIAGNOSIS — M810 Age-related osteoporosis without current pathological fracture: Secondary | ICD-10-CM | POA: Diagnosis not present

## 2024-06-23 DIAGNOSIS — Z87891 Personal history of nicotine dependence: Secondary | ICD-10-CM | POA: Diagnosis not present

## 2024-06-23 DIAGNOSIS — E538 Deficiency of other specified B group vitamins: Secondary | ICD-10-CM | POA: Diagnosis not present

## 2024-06-23 DIAGNOSIS — Z806 Family history of leukemia: Secondary | ICD-10-CM | POA: Diagnosis not present

## 2024-06-23 DIAGNOSIS — Z803 Family history of malignant neoplasm of breast: Secondary | ICD-10-CM | POA: Diagnosis not present

## 2024-06-23 DIAGNOSIS — C9110 Chronic lymphocytic leukemia of B-cell type not having achieved remission: Secondary | ICD-10-CM | POA: Diagnosis not present

## 2024-06-23 LAB — COMPREHENSIVE METABOLIC PANEL WITH GFR
ALT: 11 U/L (ref 0–44)
AST: 21 U/L (ref 15–41)
Albumin: 4.2 g/dL (ref 3.5–5.0)
Alkaline Phosphatase: 87 U/L (ref 38–126)
Anion gap: 9 (ref 5–15)
BUN: 20 mg/dL (ref 8–23)
CO2: 28 mmol/L (ref 22–32)
Calcium: 9.6 mg/dL (ref 8.9–10.3)
Chloride: 104 mmol/L (ref 98–111)
Creatinine, Ser: 1.26 mg/dL — ABNORMAL HIGH (ref 0.44–1.00)
GFR, Estimated: 44 mL/min — ABNORMAL LOW (ref >60)
Glucose, Bld: 115 mg/dL — ABNORMAL HIGH (ref 70–99)
Potassium: 4 mmol/L (ref 3.5–5.1)
Sodium: 141 mmol/L (ref 135–145)
Total Bilirubin: 0.5 mg/dL (ref 0.0–1.2)
Total Protein: 6.3 g/dL — ABNORMAL LOW (ref 6.5–8.1)

## 2024-06-23 LAB — CBC WITH DIFFERENTIAL/PLATELET
Abs Immature Granulocytes: 0.03 K/uL (ref 0.00–0.07)
Basophils Absolute: 0 K/uL (ref 0.0–0.1)
Basophils Relative: 0 %
Eosinophils Absolute: 0.2 K/uL (ref 0.0–0.5)
Eosinophils Relative: 2 %
HCT: 37.2 % (ref 36.0–46.0)
Hemoglobin: 11.6 g/dL — ABNORMAL LOW (ref 12.0–15.0)
Immature Granulocytes: 0 %
Lymphocytes Relative: 41 %
Lymphs Abs: 3.7 K/uL (ref 0.7–4.0)
MCH: 27.1 pg (ref 26.0–34.0)
MCHC: 31.2 g/dL (ref 30.0–36.0)
MCV: 86.9 fL (ref 80.0–100.0)
Monocytes Absolute: 0.6 K/uL (ref 0.1–1.0)
Monocytes Relative: 6 %
Neutro Abs: 4.6 K/uL (ref 1.7–7.7)
Neutrophils Relative %: 51 %
Platelets: 189 K/uL (ref 150–400)
RBC: 4.28 MIL/uL (ref 3.87–5.11)
RDW: 13.3 % (ref 11.5–15.5)
WBC: 9.2 K/uL (ref 4.0–10.5)
nRBC: 0 % (ref 0.0–0.2)

## 2024-06-23 LAB — LACTATE DEHYDROGENASE: LDH: 249 U/L — ABNORMAL HIGH (ref 98–192)

## 2024-06-23 NOTE — Telephone Encounter (Signed)
 Last Fill: 11/21/2023  Eye exam: 11/19/2023 WNL   Labs: 04/04/2024 CBC normal, CMP shows elevated creatinine, most likely due to diuretic use. Patient should avoid NSAIDs.SABRA ANA is low titer positive and stable, dsDNA is positive and stable, urine protein creatinine ratio normal, complements normal, sed rate normal. Labs do not indicate an autoimmune disease flare. Please forward results to her PCP.   Next Visit: 09/12/2024  Last Visit: 04/04/2024  IK:Jlunpfflwz disease (HCC)   Current Dose per office note 04/04/2024: Plaquenil  200 mg 1 tablet by mouth daily   Okay to refill Plaquenil ?

## 2024-06-29 NOTE — Progress Notes (Unsigned)
 [facility] [address] [place], [place] [address]   CLINIC:  Medical Oncology/Hematology  PCP:  Leonce Lucie [name] [address] [place] [address] [phone]   REASON FOR VISIT:  Follow-up for stage 0 CLL  CURRENT THERAPY: Surveillance  INTERVAL HISTORY:   [name] 78 y.o. female returns for routine follow-up of her stage 0 CLL. She was last seen by Dr. Rogers on 12/31/2023.  At today's visit, she reports feeling fairly well.  She reports leg cramps and joint pain. No recent hospitalizations, surgeries, or changes in baseline health status. She has 75% energy and 100% appetite. She endorses that she is maintaining a stable weight.  She denies any new lumps or bumps.   She  has not had any fever, chills, night sweats, or unintentional weight loss.   She  has not had any extreme fatigue. She reports URI (COVID/flu negative) October 2025, but denies any recurrent or frequent infections.   No early satiety or abdominal pain.    ASSESSMENT & PLAN:  1.  Clinical stage 0 CLL - he was evaluated at the request of Dr. Dolphus for elevated white count, predominantly lymphocytes since January 2019. - FLOW CYTOMETRY (12/03/2019): Monoclonal B-cell population with coexpression of CD5 comprises 36% of lymphocytes.  Overall the morphology and immunophenotype favor CLL.  However there is a small chance of it being atypical mantle cell lymphoma.  (Per pathologist, FISH studies for t(11;14) may be helpful to formally exclude an atypical mantle cell lymphoma, if clinically indicated.) - She denies any fevers, night sweats or weight loss in the last 6 months. - No infections in the last 6 months reported. - Physical exam (06/30/2024): No palpable adenopathy in the neck or axillary region. - Most recent labs (06/23/2024): Grossly normal CBC/D, with normal absolute lymphocyte 3.7.  LDH mildly elevated at 249.  CMP at baseline with more LFTs. - PLAN: No  indication for treatment at this time. - RTC in 6 months for follow-up with repeat labs and exam. - Consider further workup with CLL FISH panel, T p53 mutation, and IGVH mutational status if there is any significant change in blood work or symptoms.  2.  Vitamin B12 deficiency: - Vitamin B12 level was low at 177 in 2021 - She had normal vitamin B12 at 980 on 10/26/2023 - She is taking vitamin B12 1 mg tablet daily  - PLAN: Recheck B12/MMA at follow-up.  For now, she should continue daily vitamin B12.  We will also check iron panel due to mild anemia (likely from CKD).  3.  OTHER HEALTH CONCERNS - OSTEOPOROSIS: DEXA from 03/19/2023 consistent with osteoporosis.  Being managed via rheumatology Basilio Craze, PA-C), but declined treatment with Prolia or any other osteoporosis treatment at this time. - AUTOIMMUNE DISEASE: Follows with Dr. Samule Craze, PA-C (rheumatology).  She is taking Plaquenil  200 mg daily.  PLAN SUMMARY: >> Labs in 6 months = CBC/D, CMP, LDH, ferritin, iron/TIBC, B12, MMA >> OFFICE visit in 6 months (same day as labs okay, requested by patient)     REVIEW OF SYSTEMS:   Review of Systems  Constitutional:  Positive for fatigue. Negative for appetite change, chills, diaphoresis, fever and unexpected weight change.  HENT:   Negative for lump/mass and nosebleeds.   Eyes:  Negative for eye problems.  Respiratory:  Positive for cough. Negative for hemoptysis and shortness of breath.   Cardiovascular:  Negative for chest pain, leg swelling and palpitations.  Gastrointestinal:  Positive for nausea. Negative for abdominal pain, blood in stool, constipation, diarrhea and vomiting.  Genitourinary:  Negative for hematuria.   Skin: Negative.   Neurological:  Negative for dizziness, headaches and light-headedness.  Hematological:  Does not bruise/bleed easily.  Psychiatric/Behavioral:  Positive for sleep disturbance.      PHYSICAL EXAM:  ECOG PERFORMANCE STATUS: 2 -  Symptomatic, <50% confined to bed  Vitals:   06/30/24 1314 06/30/24 1319  BP: (!) 168/92 (!) 160/78  Pulse: 72   Resp: 16   Temp: 97.7 F (36.5 C)   SpO2: 95%    Filed Weights   06/30/24 1314  Weight: 249 lb 12.5 oz (113.3 kg)   Physical Exam Constitutional:      Appearance: Normal appearance. She is morbidly obese.  Cardiovascular:     Heart sounds: Normal heart sounds.  Pulmonary:     Breath sounds: Normal breath sounds.  Neurological:     General: No focal deficit present.     Mental Status: Mental status is at baseline.  Psychiatric:        Behavior: Behavior normal. Behavior is cooperative.     PAST MEDICAL/SURGICAL HISTORY:  Past Medical History:  Diagnosis Date   Baker's cyst of knee 10/18/2011   CLL (chronic lymphocytic leukemia) (HCC)    per patient    Hypertension    Osteoarthritis    Thyroid  disease    Past Surgical History:  Procedure Laterality Date   FOOT SURGERY Right 2017   hammer toe    HAND SURGERY Left 2014   finger, hand   REVERSE TOTAL SHOULDER ARTHROPLASTY Right 09/08/2022   SKIN TAG REMOVAL  11/07/2018   stomach and below left eye    THYROID  SURGERY     TUBAL LIGATION     VAGINAL HYSTERECTOMY      SOCIAL HISTORY:  Social History   Socioeconomic History   Marital status: Married    Spouse name: Not on file   Number of children: 3   Years of education: 12   Highest education level: Not on file  Occupational History   Not on file  Tobacco Use   Smoking status: Former    Current packs/day: 0.00    Average packs/day: 1.5 packs/day for 15.0 years (22.5 ttl pk-yrs)    Types: Cigarettes    Start date: 07/1984    Quit date: 07/1999    Years since quitting: 24.9    Passive exposure: Never   Smokeless tobacco: Never  Vaping Use   Vaping status: Never Used  Substance and Sexual Activity   Alcohol use: No   Drug use: No   Sexual activity: Not on file  Other Topics Concern   Not on file  Social History Narrative   Not on file    Social Drivers of Health   Financial Resource Strain: Not on file  Food Insecurity: Not on file  Transportation Needs: No Transportation Needs (10/11/2020)   PRAPARE - Transportation    Lack of Transportation (Medical): No    Lack of Transportation (Non-Medical): No  Physical Activity: Inactive (10/11/2020)   Exercise Vital Sign    Days of Exercise per Week: 0 days    Minutes of Exercise per Session: 0 min  Stress: Not on file  Social Connections: Not on file  Intimate Partner Violence: Not At Risk (10/11/2020)   Humiliation, Afraid, Rape, and Kick questionnaire    Fear of Current or Ex-Partner: No    Emotionally Abused: No    Physically Abused:  No    Sexually Abused: No    FAMILY HISTORY:  Family History  Problem Relation Age of Onset   Cancer Other    Diabetes Other    Cancer Mother        breast   COPD Mother    Leukemia Father    Osteoporosis Sister    Asthma Sister    Dermatomyositis Sister    COPD Sister    Kidney disease Sister    Cancer Son        lymphoma, colon   Diabetes Son    COPD Daughter    Osteoporosis Daughter    Rheum arthritis Daughter     CURRENT MEDICATIONS:  Outpatient Encounter Medications as of 06/30/2024  Medication Sig Note   Acetaminophen  (TYLENOL  PO) Take 1 tablet by mouth as needed (pain).    amoxicillin-clavulanate (AUGMENTIN) 875-125 MG tablet Take 1 tablet by mouth 2 (two) times daily.    ASPIRIN 81 PO Take 1 tablet by mouth every other day. 06/04/2023: Received 3 from Dr's office   b complex vitamins capsule Take 1 capsule by mouth daily.    CALCIUM PO Take 1 capsule by mouth daily. 1000 mg    cetirizine (ZYRTEC) 10 MG tablet Take 10 mg by mouth daily.    hydroxychloroquine  (PLAQUENIL ) 200 MG tablet Take 1 tablet by mouth once daily    ketoconazole (NIZORAL) 2 % cream Apply topically 2 (two) times daily as needed.    levothyroxine (SYNTHROID, LEVOTHROID) 88 MCG tablet Take 88 mcg by mouth daily.    magnesium oxide (MAG-OX) 400  (240 Mg) MG tablet Take 400 mg by mouth daily.    omeprazole  (PRILOSEC) 20 MG capsule Take 20 mg by mouth 2 (two) times daily.    TART CHERRY PO Take 1 capsule by mouth daily.    TURMERIC CURCUMIN PO Take 1 capsule by mouth daily.    vitamin B-12 (CYANOCOBALAMIN ) 500 MCG tablet Take 500 mcg by mouth daily.    VITAMIN D  PO Take 1 capsule by mouth daily.    hydrochlorothiazide (HYDRODIURIL) 12.5 MG tablet Take 12.5 mg by mouth daily. (Patient not taking: Reported on 06/30/2024)    No facility-administered encounter medications on file as of 06/30/2024.    ALLERGIES:  Allergies  Allergen Reactions   Citrus Swelling    Makes her lips swell   Sulfa Antibiotics Hives    LABORATORY DATA:  I have reviewed the labs as listed.  CBC    Component Value Date/Time   WBC 9.2 06/23/2024 1350   RBC 4.28 06/23/2024 1350   HGB 11.6 (L) 06/23/2024 1350   HCT 37.2 06/23/2024 1350   PLT 189 06/23/2024 1350   MCV 86.9 06/23/2024 1350   MCH 27.1 06/23/2024 1350   MCHC 31.2 06/23/2024 1350   RDW 13.3 06/23/2024 1350   LYMPHSABS 3.7 06/23/2024 1350   MONOABS 0.6 06/23/2024 1350   EOSABS 0.2 06/23/2024 1350   BASOSABS 0.0 06/23/2024 1350      Latest Ref Rng & Units 06/23/2024    1:50 PM 04/04/2024   12:00 AM 12/31/2023   10:15 AM  CMP  Glucose 70 - 99 mg/dL 884  98  899   BUN 8 - 23 mg/dL 20  15  15    Creatinine 0.44 - 1.00 mg/dL 8.73  8.70  8.96   Sodium 135 - 145 mmol/L 141  141  136   Potassium 3.5 - 5.1 mmol/L 4.0  4.1  3.8   Chloride 98 - 111  mmol/L 104  101  101   CO2 22 - 32 mmol/L 28  31  27    Calcium 8.9 - 10.3 mg/dL 9.6  9.6  9.1   Total Protein 6.5 - 8.1 g/dL 6.3  6.4  6.1   Total Bilirubin 0.0 - 1.2 mg/dL 0.5  0.6  0.7   Alkaline Phos 38 - 126 U/L 87   67   AST 15 - 41 U/L 21  18  19    ALT 0 - 44 U/L 11  11  14      DIAGNOSTIC IMAGING:  I have independently reviewed the relevant imaging and discussed with the patient.   WRAP UP:  All questions were answered. The patient  knows to call the clinic with any problems, questions or concerns.  Medical decision making: Moderate  Time spent on visit: I spent 20 minutes counseling the patient face to face. The total time spent in the appointment was 30 minutes and more than 50% was on counseling.  Pleasant CHRISTELLA Barefoot, PA-C  06/30/24 2:29 PM

## 2024-06-30 ENCOUNTER — Inpatient Hospital Stay: Admitting: Physician Assistant

## 2024-06-30 VITALS — BP 160/78 | HR 72 | Temp 97.7°F | Resp 16 | Wt 249.8 lb

## 2024-06-30 DIAGNOSIS — D649 Anemia, unspecified: Secondary | ICD-10-CM

## 2024-06-30 DIAGNOSIS — E538 Deficiency of other specified B group vitamins: Secondary | ICD-10-CM | POA: Diagnosis not present

## 2024-06-30 DIAGNOSIS — C9110 Chronic lymphocytic leukemia of B-cell type not having achieved remission: Secondary | ICD-10-CM

## 2024-06-30 NOTE — Patient Instructions (Signed)
 [facility] at [facility] **VISIT SUMMARY & IMPORTANT INSTRUCTIONS **   You were seen today by Pleasant Barefoot PA-C for your follow-up visit.    CHRONIC LYMPHOCYTIC ANEMIA (CLL) Your labs are currently normal, without any elevation in lymphocytes or other white blood cells. You did not have any evidence of lymph node swelling on your exam. Will continue to monitor with labs and office visit in 6 months.  VITAMIN B12 DEFICIENCY Continue taking vitamin B12 tablet daily.  We will recheck your levels at follow-up.  FOLLOW-UP APPOINTMENT: 6 months  ** Thank you for trusting me with your healthcare!  I strive to provide all of my patients with quality care at each visit.  If you receive a survey for this visit, I would be so grateful to you for taking the time to provide feedback.  Thank you in advance!  ~ Riana Tessmer                                        Dr. Mickiel Davonna Pleasant Barefoot, PA-C         Delon Hope, NP   - - - - - - - - - - - - - - - - - -    Thank you for choosing [facility] at [facility] to provide your oncology and hematology care.  To afford each patient quality time with our provider, please arrive at least 15 minutes before your scheduled appointment time.   If you have a lab appointment with the Cancer Center please come in thru the Main Entrance and check in at the main information desk.  You need to re-schedule your appointment should you arrive 10 or more minutes late.  We strive to give you quality time with our providers, and arriving late affects you and other patients whose appointments are after yours.  Also, if you no show three or more times for appointments you may be dismissed from the clinic at the providers discretion.     Again, thank you for choosing [facility].  Our hope is that these requests will decrease the amount of time that you wait before being seen by our physicians.        _____________________________________________________________  Should you have questions after your visit to [facility], please contact our office at [phone] and follow the prompts.  Our office hours are 8:00 a.m. and 4:30 p.m. Monday - Friday.  Please note that voicemails left after 4:00 p.m. may not be returned until the following business day.  We are closed weekends and major holidays.  You do have access to a nurse 24-7, just call the main number to the clinic [phone] and do not press any options, hold on the line and a nurse will answer the phone.    For prescription refill requests, have your pharmacy contact our office and allow 72 hours.

## 2024-07-28 DIAGNOSIS — M9901 Segmental and somatic dysfunction of cervical region: Secondary | ICD-10-CM | POA: Diagnosis not present

## 2024-07-28 DIAGNOSIS — M9902 Segmental and somatic dysfunction of thoracic region: Secondary | ICD-10-CM | POA: Diagnosis not present

## 2024-07-28 DIAGNOSIS — M47812 Spondylosis without myelopathy or radiculopathy, cervical region: Secondary | ICD-10-CM | POA: Diagnosis not present

## 2024-07-28 DIAGNOSIS — M531 Cervicobrachial syndrome: Secondary | ICD-10-CM | POA: Diagnosis not present

## 2024-07-28 DIAGNOSIS — M546 Pain in thoracic spine: Secondary | ICD-10-CM | POA: Diagnosis not present

## 2024-07-29 DIAGNOSIS — M531 Cervicobrachial syndrome: Secondary | ICD-10-CM | POA: Diagnosis not present

## 2024-07-29 DIAGNOSIS — M9902 Segmental and somatic dysfunction of thoracic region: Secondary | ICD-10-CM | POA: Diagnosis not present

## 2024-07-29 DIAGNOSIS — M546 Pain in thoracic spine: Secondary | ICD-10-CM | POA: Diagnosis not present

## 2024-07-29 DIAGNOSIS — M9901 Segmental and somatic dysfunction of cervical region: Secondary | ICD-10-CM | POA: Diagnosis not present

## 2024-07-29 DIAGNOSIS — M47812 Spondylosis without myelopathy or radiculopathy, cervical region: Secondary | ICD-10-CM | POA: Diagnosis not present

## 2024-08-18 ENCOUNTER — Encounter: Payer: Self-pay | Admitting: *Deleted

## 2024-08-29 NOTE — Progress Notes (Unsigned)
 "  Office Visit Note  Patient: Jacqueline Casey             Date of Birth: 07/20/1946           MRN: [id number]             PCP: Leonce Lucie PARAS, PA-C Referring: Leonce Lucie PARAS, PA-C Visit Date: 09/12/2024 Occupation: Data Unavailable  Subjective:  Muscle cramping   History of Present Illness: Jacqueline Casey is a 79 y.o. female with history of autoimmune disease.  Patient remains on plaquenil  200 mg 1 tablet by mouth daily.  She is tolerating Plaquenil  without any side effects and has not had any gaps in therapy.  She denies any signs or symptoms of a flare.  Her primary concern has been lower leg muscle cramping especially at night.  She has been using Thera works topically for relief.  She has not been taking magnesium consistently.  She plans on checking with her cardiologist prior to initiating magnesium at bedtime.  Patient states she is also been having some increased discomfort in the left knee joint.  She is been using a rollator walker to assist with ambulation.  She denies any recent falls.  Patient continues to have chronic mouth dryness.  She denies any oral or nasal ulcerations.  She denies any recent or recurrent infections.  She denies any new medical conditions.  She has not had any recent rashes but has noticed increased bruising despite discontinuing aspirin.  Activities of Daily Living:  Patient reports morning stiffness for 1 or more hours.   Patient Reports nocturnal pain.  Difficulty dressing/grooming: Reports Difficulty climbing stairs: Reports Difficulty getting out of chair: Reports Difficulty using hands for taps, buttons, cutlery, and/or writing: Reports  Review of Systems  Constitutional:  Negative for fatigue.  HENT:  Positive for mouth dryness. Negative for mouth sores.   Eyes:  Positive for dryness.  Respiratory:  Negative for shortness of breath.   Cardiovascular:  Negative for chest pain and palpitations.  Gastrointestinal:  Negative for  blood in stool, constipation and diarrhea.  Endocrine: Negative for increased urination.  Genitourinary:  Positive for involuntary urination.  Musculoskeletal:  Positive for joint pain, gait problem, joint pain, joint swelling, myalgias, muscle weakness, morning stiffness, muscle tenderness and myalgias.  Skin:  Positive for color change and sensitivity to sunlight. Negative for rash and hair loss.  Allergic/Immunologic: Negative for susceptible to infections.  Neurological:  Negative for dizziness and headaches.  Hematological:  Negative for swollen glands.  Psychiatric/Behavioral:  Positive for sleep disturbance. Negative for depressed mood. The patient is nervous/anxious.     PMFS History:  Patient Active Problem List   Diagnosis Date Noted   CLL (chronic lymphocytic leukemia) (HCC) 12/29/2019   Leukocytosis 12/03/2019   Baker's cyst of knee 10/18/2011   Knee mass 10/04/2011    Past Medical History:  Diagnosis Date   Baker's cyst of knee 10/18/2011   CLL (chronic lymphocytic leukemia) (HCC)    per patient    Hypertension    Osteoarthritis    Thyroid  disease     Family History  Problem Relation Age of Onset   Cancer Other    Diabetes Other    Cancer Mother        breast   COPD Mother    Leukemia Father    Osteoporosis Sister    Asthma Sister    Dermatomyositis Sister    COPD Sister    Kidney disease Sister  Cancer Son        lymphoma, colon   Diabetes Son    COPD Daughter    Osteoporosis Daughter    Rheum arthritis Daughter    Past Surgical History:  Procedure Laterality Date   FOOT SURGERY Right 2017   hammer toe    HAND SURGERY Left 2014   finger, hand   REVERSE TOTAL SHOULDER ARTHROPLASTY Right 09/08/2022   SKIN TAG REMOVAL  11/07/2018   stomach and below left eye    THYROID  SURGERY     TUBAL LIGATION     VAGINAL HYSTERECTOMY     Social History[1] Social History   Social History Narrative   Not on file      There is no immunization history  on file for this patient.   Objective: Vital Signs: BP 132/82   Pulse 79   Temp 97.6 F (36.4 C)   Resp 16   Ht 5' 6.5 (1.689 m)   Wt 250 lb 12.8 oz (113.8 kg)   BMI 39.87 kg/m    Physical Exam Vitals and nursing note reviewed.  Constitutional:      Appearance: She is well-developed.  HENT:     Head: Normocephalic and atraumatic.  Eyes:     Conjunctiva/sclera: Conjunctivae normal.  Cardiovascular:     Rate and Rhythm: Normal rate and regular rhythm.     Heart sounds: Normal heart sounds.  Pulmonary:     Effort: Pulmonary effort is normal.     Breath sounds: Normal breath sounds.  Abdominal:     General: Bowel sounds are normal.     Palpations: Abdomen is soft.  Musculoskeletal:     Cervical back: Normal range of motion.  Lymphadenopathy:     Cervical: No cervical adenopathy.  Skin:    General: Skin is warm and dry.     Capillary Refill: Capillary refill takes less than 2 seconds.  Neurological:     Mental Status: She is alert and oriented to person, place, and time.  Psychiatric:        Behavior: Behavior normal.      Musculoskeletal Exam: Patient remained seated during examination today.  C-spine has limited range of motion.  Thoracic kyphosis noted.  The shoulder joints have slightly limited range of motion with internal rotation.  The right shoulder is replaced. elbow joints, wrist joints, MCPs, PIPs, DIPs have good range of motion with no synovitis.  CMC, PIP, DIP thickening consistent with osteoarthritis of both hands.  Complete fist formation bilaterally.   Knee joints have good range of motion no warmth or effusion.  Ankle joints have good range of motion no tenderness or joint swelling.  Baker's cyst behind both knees. No evidence of Achilles tendinitis or plantar fasciitis.   CDAI Exam: CDAI Score: -- Patient Global: --; Provider Global: -- Swollen: --; Tender: -- Joint Exam 09/12/2024   No joint exam has been documented for this visit   There is  currently no information documented on the homunculus. Go to the Rheumatology activity and complete the homunculus joint exam.  Investigation: No additional findings.  Imaging: No results found.  Recent Labs: Lab Results  Component Value Date   WBC 9.2 06/23/2024   HGB 11.6 (L) 06/23/2024   PLT 189 06/23/2024   NA 141 06/23/2024   K 4.0 06/23/2024   CL 104 06/23/2024   CO2 28 06/23/2024   GLUCOSE 115 (H) 06/23/2024   BUN 20 06/23/2024   CREATININE 1.26 (H) 06/23/2024   BILITOT 0.5  06/23/2024   ALKPHOS 87 06/23/2024   AST 21 06/23/2024   ALT 11 06/23/2024   PROT 6.3 (L) 06/23/2024   ALBUMIN 4.2 06/23/2024   CALCIUM 9.6 06/23/2024   GFRAA 45 (L) 03/29/2020   QFTBGOLDPLUS NEGATIVE 04/07/2022    Speciality Comments: PLQ eye exam: 11/19/2023 WNL. [facility]. Follow up in 6 months.  Procedures:  No procedures performed Allergies: Citrus and Sulfa antibiotics   Assessment / Plan:     Visit Diagnoses: Autoimmune disease - ANA 1:160 NH, dsDNA+, inflammatory arthritis. X-rays obtained in 2019 showed MCP narrowing consistent with inflammatory arthritis: She has not had any signs or symptoms of a flare.  She is clinically been doing well taking Plaquenil  200 mg 1 tablet by mouth daily.  She is tolerating Plaquenil  without any side effects and has not had any gaps in therapy.  No recent rashes.  No oral or nasal ulcerations.  She has chronic mouth dryness so we discussed the use of Biotene products for symptomatic relief.  No signs of synovitis were noted on examination today.  She has not had any symptoms of Raynaud's phenomenon.  Plan to obtain the following lab work today for further evaluation.  She will remain on Plaquenil  as prescribed.  She was advised to notify us  if she develops signs or symptoms of a flare.  She will follow up in 5 months or sooner if needed.  - Plan: C3 and C4, Sedimentation rate, Comprehensive metabolic panel with GFR, Protein / creatinine ratio,  urine, CBC with Differential/Platelet, Anti-DNA antibody, double-stranded  High risk medication use - Plaquenil  200 mg 1 tablet by mouth daily.  CBC and CMP updated on  06/23/24. Orders for CBC and CMP released today.  PLQ eye exam: 11/19/2023 WNL. [facility].  - Plan: Comprehensive metabolic panel with GFR, CBC with Differential/Platelet  Primary osteoarthritis of both hands: CMC, PIP, DIP thickening consistent with osteoarthritis of both hands.  No synovitis noted.  Complete fist formation bilaterally.  S/P reverse total shoulder arthroplasty, right - Fall on 09/04/2022.  Underwent surgery performed by Dr. Melita.  Doing well.  Good range of motion without discomfort currently.  Chronic pain of right ankle - Dr. Magdalen.  She had a cortisone injection on 08/30/22.  Right achilles tendon ruptured-fall in January 2024-not planning to proceed with surgical intervention.  Age-related osteoporosis without current pathological fracture - DEXA on 10/28/20: The BMD LFN 0.697 g/cm2 with a T-score of -2.5. DEXA 03/19/23: The BMD measured at Femur Neck Left is 0.693 g/cm2 with a T-score of -2.5.  She is taking a daily calcium supplement and vitamin D . DEXA will be due in July 2026.  Other medical conditions are listed as follows:  Granuloma annulare  CLL (chronic lymphocytic leukemia) (HCC)  History of hypothyroidism  History of hypertension: Blood pressure was 132/82 today.  Vitamin D  deficiency  Vitamin B12 deficiency  Orders: Orders Placed This Encounter  Procedures   C3 and C4   Sedimentation rate   Comprehensive metabolic panel with GFR   Protein / creatinine ratio, urine   CBC with Differential/Platelet   Anti-DNA antibody, double-stranded   Meds ordered this encounter  Medications   hydroxychloroquine  (PLAQUENIL ) 200 MG tablet    Sig: Take 1 tablet (200 mg total) by mouth daily.    Dispense:  90 tablet    Refill:  0     Follow-Up Instructions: Return in about 5  months (around 02/10/2025) for Autoimmune Disease.   Waddell CHRISTELLA Craze, PA-C  Note - This record has been created using [employer].  Chart creation errors have been sought, but may not always  have been located. Such creation errors do not reflect on  the standard of medical care.     [1]  Social History Tobacco Use   Smoking status: Former    Current packs/day: 0.00    Average packs/day: 1.5 packs/day for 15.0 years (22.5 ttl pk-yrs)    Types: Cigarettes    Start date: 07/1984    Quit date: 07/1999    Years since quitting: 25.1    Passive exposure: Never   Smokeless tobacco: Never  Vaping Use   Vaping status: Never Used  Substance Use Topics   Alcohol use: No   Drug use: No   "

## 2024-09-12 ENCOUNTER — Encounter: Payer: Self-pay | Admitting: Physician Assistant

## 2024-09-12 ENCOUNTER — Ambulatory Visit: Attending: Physician Assistant | Admitting: Physician Assistant

## 2024-09-12 VITALS — BP 132/82 | HR 79 | Temp 97.6°F | Resp 16 | Ht 66.5 in | Wt 250.8 lb

## 2024-09-12 DIAGNOSIS — M19042 Primary osteoarthritis, left hand: Secondary | ICD-10-CM

## 2024-09-12 DIAGNOSIS — C9110 Chronic lymphocytic leukemia of B-cell type not having achieved remission: Secondary | ICD-10-CM | POA: Diagnosis not present

## 2024-09-12 DIAGNOSIS — E538 Deficiency of other specified B group vitamins: Secondary | ICD-10-CM

## 2024-09-12 DIAGNOSIS — E559 Vitamin D deficiency, unspecified: Secondary | ICD-10-CM | POA: Diagnosis not present

## 2024-09-12 DIAGNOSIS — M810 Age-related osteoporosis without current pathological fracture: Secondary | ICD-10-CM

## 2024-09-12 DIAGNOSIS — Z96611 Presence of right artificial shoulder joint: Secondary | ICD-10-CM

## 2024-09-12 DIAGNOSIS — G8929 Other chronic pain: Secondary | ICD-10-CM

## 2024-09-12 DIAGNOSIS — L920 Granuloma annulare: Secondary | ICD-10-CM

## 2024-09-12 DIAGNOSIS — Z79899 Other long term (current) drug therapy: Secondary | ICD-10-CM | POA: Diagnosis not present

## 2024-09-12 DIAGNOSIS — M25571 Pain in right ankle and joints of right foot: Secondary | ICD-10-CM | POA: Diagnosis not present

## 2024-09-12 DIAGNOSIS — M19041 Primary osteoarthritis, right hand: Secondary | ICD-10-CM | POA: Diagnosis not present

## 2024-09-12 DIAGNOSIS — Z8639 Personal history of other endocrine, nutritional and metabolic disease: Secondary | ICD-10-CM | POA: Diagnosis not present

## 2024-09-12 DIAGNOSIS — Z8679 Personal history of other diseases of the circulatory system: Secondary | ICD-10-CM

## 2024-09-12 DIAGNOSIS — M359 Systemic involvement of connective tissue, unspecified: Secondary | ICD-10-CM | POA: Diagnosis not present

## 2024-09-12 MED ORDER — HYDROXYCHLOROQUINE SULFATE 200 MG PO TABS: 200.0000 mg | ORAL_TABLET | Freq: Every day | ORAL | 0 refills | Status: AC

## 2024-09-12 NOTE — Patient Instructions (Signed)
Biotene products for dry mouth

## 2024-09-13 LAB — PROTEIN / CREATININE RATIO, URINE
Creatinine, Urine: 75 mg/dL (ref 20–275)
Protein/Creat Ratio: 67 mg/g{creat} (ref 24–184)
Protein/Creatinine Ratio: 0.067 mg/mg{creat} (ref 0.024–0.184)
Total Protein, Urine: 5 mg/dL (ref 5–24)

## 2024-09-13 LAB — COMPREHENSIVE METABOLIC PANEL WITH GFR
AG Ratio: 1.8 (calc) (ref 1.0–2.5)
ALT: 12 U/L (ref 6–29)
AST: 19 U/L (ref 10–35)
Albumin: 4.4 g/dL (ref 3.6–5.1)
Alkaline phosphatase (APISO): 92 U/L (ref 37–153)
BUN/Creatinine Ratio: 15 (calc) (ref 6–22)
BUN: 16 mg/dL (ref 7–25)
CO2: 31 mmol/L (ref 20–32)
Calcium: 9.6 mg/dL (ref 8.6–10.4)
Chloride: 101 mmol/L (ref 98–110)
Creat: 1.05 mg/dL — ABNORMAL HIGH (ref 0.60–1.00)
Globulin: 2.4 g/dL (ref 1.9–3.7)
Glucose, Bld: 99 mg/dL (ref 65–99)
Potassium: 4.3 mmol/L (ref 3.5–5.3)
Sodium: 141 mmol/L (ref 135–146)
Total Bilirubin: 0.7 mg/dL (ref 0.2–1.2)
Total Protein: 6.8 g/dL (ref 6.1–8.1)
eGFR: 54 mL/min/{1.73_m2} — ABNORMAL LOW

## 2024-09-13 LAB — SEDIMENTATION RATE: Sed Rate: 11 mm/h (ref 0–30)

## 2024-09-13 LAB — CBC WITH DIFFERENTIAL/PLATELET
Absolute Lymphocytes: 4844 {cells}/uL — ABNORMAL HIGH (ref 850–3900)
Absolute Monocytes: 597 {cells}/uL (ref 200–950)
Basophils Absolute: 35 {cells}/uL (ref 0–200)
Basophils Relative: 0.3 %
Eosinophils Absolute: 164 {cells}/uL (ref 15–500)
Eosinophils Relative: 1.4 %
HCT: 38.4 % (ref 35.9–46.0)
Hemoglobin: 12.8 g/dL (ref 11.7–15.5)
MCH: 28.1 pg (ref 27.0–33.0)
MCHC: 33.3 g/dL (ref 31.6–35.4)
MCV: 84.4 fL (ref 81.4–101.7)
MPV: 10.6 fL (ref 7.5–12.5)
Monocytes Relative: 5.1 %
Neutro Abs: 6061 {cells}/uL (ref 1500–7800)
Neutrophils Relative %: 51.8 %
Platelets: 205 10*3/uL (ref 140–400)
RBC: 4.55 Million/uL (ref 3.80–5.10)
RDW: 13 % (ref 11.0–15.0)
Total Lymphocyte: 41.4 %
WBC: 11.7 10*3/uL — ABNORMAL HIGH (ref 3.8–10.8)

## 2024-09-13 LAB — C3 AND C4
C3 Complement: 158 mg/dL (ref 83–193)
C4 Complement: 29 mg/dL (ref 15–57)

## 2024-09-13 LAB — ANTI-DNA ANTIBODY, DOUBLE-STRANDED: ds DNA Ab: 8 [IU]/mL — ABNORMAL HIGH

## 2024-09-15 ENCOUNTER — Ambulatory Visit: Payer: Self-pay | Admitting: Physician Assistant

## 2024-09-15 NOTE — Progress Notes (Signed)
 Creatinine is borderline elevated but has improved-1.05 and GFR is low-54. Avoid NSAID use.  Rest of CMP WNL.   WBC count is elevated-absolute lymphocytes are elevated. Patient has a history of CLL-forward to hematologist. Rest of CBC WNL.   dsDNA remains within indeterminate range.   ESR WNL Urine protein creatinine ratio WNL  Complements WNL.   Labs are not consistent with a flare.  No change in treatment.

## 2024-10-08 ENCOUNTER — Ambulatory Visit: Admitting: Cardiology

## 2024-12-22 ENCOUNTER — Inpatient Hospital Stay

## 2024-12-29 ENCOUNTER — Inpatient Hospital Stay: Admitting: Physician Assistant

## 2024-12-29 ENCOUNTER — Inpatient Hospital Stay

## 2025-02-13 ENCOUNTER — Ambulatory Visit: Admitting: Physician Assistant
